# Patient Record
Sex: Female | Born: 1940 | Hispanic: No | Smoking: Never smoker
Health system: Southern US, Community
[De-identification: ages and names within clinical notes are randomized; demographics above are authoritative.]

## PROBLEM LIST (undated history)

## (undated) DIAGNOSIS — T380X5A Adverse effect of glucocorticoids and synthetic analogues, initial encounter: Secondary | ICD-10-CM

## (undated) DIAGNOSIS — Z9289 Personal history of other medical treatment: Secondary | ICD-10-CM

## (undated) DIAGNOSIS — R718 Other abnormality of red blood cells: Secondary | ICD-10-CM

## (undated) DIAGNOSIS — M8580 Other specified disorders of bone density and structure, unspecified site: Secondary | ICD-10-CM

## (undated) DIAGNOSIS — Z800 Family history of malignant neoplasm of digestive organs: Secondary | ICD-10-CM

## (undated) DIAGNOSIS — E785 Hyperlipidemia, unspecified: Secondary | ICD-10-CM

## (undated) DIAGNOSIS — D7589 Other specified diseases of blood and blood-forming organs: Secondary | ICD-10-CM

## (undated) DIAGNOSIS — C539 Malignant neoplasm of cervix uteri, unspecified: Secondary | ICD-10-CM

## (undated) DIAGNOSIS — E039 Hypothyroidism, unspecified: Secondary | ICD-10-CM

## (undated) DIAGNOSIS — K589 Irritable bowel syndrome without diarrhea: Secondary | ICD-10-CM

## (undated) DIAGNOSIS — N816 Rectocele: Secondary | ICD-10-CM

## (undated) DIAGNOSIS — L719 Rosacea, unspecified: Secondary | ICD-10-CM

## (undated) DIAGNOSIS — IMO0001 Reserved for inherently not codable concepts without codable children: Secondary | ICD-10-CM

## (undated) DIAGNOSIS — M1990 Unspecified osteoarthritis, unspecified site: Secondary | ICD-10-CM

## (undated) DIAGNOSIS — K2970 Gastritis, unspecified, without bleeding: Secondary | ICD-10-CM

## (undated) DIAGNOSIS — Z23 Encounter for immunization: Secondary | ICD-10-CM

## (undated) DIAGNOSIS — H579 Unspecified disorder of eye and adnexa: Secondary | ICD-10-CM

## (undated) DIAGNOSIS — D649 Anemia, unspecified: Secondary | ICD-10-CM

## (undated) HISTORY — DX: Malignant neoplasm of cervix uteri, unspecified: C53.9

## (undated) HISTORY — DX: Encounter for immunization: Z23

## (undated) HISTORY — PX: BREAST EXCISIONAL BIOPSY: SUR124

## (undated) HISTORY — DX: Rectocele: N81.6

## (undated) HISTORY — DX: Adverse effect of glucocorticoids and synthetic analogues, initial encounter: T38.0X5A

## (undated) HISTORY — DX: Family history of malignant neoplasm of digestive organs: Z80.0

## (undated) HISTORY — PX: ABDOMINAL HYSTERECTOMY: SHX81

## (undated) HISTORY — DX: Personal history of other medical treatment: Z92.89

## (undated) HISTORY — DX: Other specified disorders of bone density and structure, unspecified site: M85.80

## (undated) HISTORY — DX: Hypothyroidism, unspecified: E03.9

## (undated) HISTORY — DX: Hyperlipidemia, unspecified: E78.5

## (undated) HISTORY — DX: Unspecified disorder of eye and adnexa: H57.9

## (undated) HISTORY — DX: Anemia, unspecified: D64.9

## (undated) HISTORY — PX: APPENDECTOMY: SHX54

## (undated) HISTORY — DX: Unspecified osteoarthritis, unspecified site: M19.90

## (undated) HISTORY — DX: Irritable bowel syndrome, unspecified: K58.9

## (undated) HISTORY — DX: Other abnormality of red blood cells: R71.8

## (undated) HISTORY — DX: Gastritis, unspecified, without bleeding: K29.70

## (undated) HISTORY — DX: Rosacea, unspecified: L71.9

## (undated) HISTORY — DX: Other specified diseases of blood and blood-forming organs: D75.89

## (undated) HISTORY — DX: Reserved for inherently not codable concepts without codable children: IMO0001

## (undated) HISTORY — PX: CATARACT EXTRACTION, BILATERAL: SHX1313

---

## 1966-09-22 DIAGNOSIS — C539 Malignant neoplasm of cervix uteri, unspecified: Secondary | ICD-10-CM

## 1966-09-22 HISTORY — DX: Malignant neoplasm of cervix uteri, unspecified: C53.9

## 1997-09-22 DIAGNOSIS — Z23 Encounter for immunization: Secondary | ICD-10-CM

## 1997-09-22 HISTORY — DX: Encounter for immunization: Z23

## 2002-06-15 ENCOUNTER — Other Ambulatory Visit: Admission: RE | Admit: 2002-06-15 | Discharge: 2002-06-15 | Payer: Self-pay | Admitting: Obstetrics and Gynecology

## 2003-04-24 ENCOUNTER — Encounter: Payer: Self-pay | Admitting: Internal Medicine

## 2003-09-23 DIAGNOSIS — IMO0001 Reserved for inherently not codable concepts without codable children: Secondary | ICD-10-CM

## 2003-09-23 HISTORY — DX: Reserved for inherently not codable concepts without codable children: IMO0001

## 2004-03-04 ENCOUNTER — Encounter: Payer: Self-pay | Admitting: Internal Medicine

## 2005-01-21 ENCOUNTER — Ambulatory Visit: Payer: Self-pay | Admitting: Internal Medicine

## 2005-01-27 ENCOUNTER — Ambulatory Visit: Payer: Self-pay | Admitting: Internal Medicine

## 2005-03-03 ENCOUNTER — Ambulatory Visit: Payer: Self-pay | Admitting: Internal Medicine

## 2005-03-04 ENCOUNTER — Ambulatory Visit: Payer: Self-pay | Admitting: Internal Medicine

## 2005-05-08 ENCOUNTER — Ambulatory Visit: Payer: Self-pay | Admitting: Internal Medicine

## 2005-05-19 ENCOUNTER — Ambulatory Visit: Payer: Self-pay | Admitting: Internal Medicine

## 2005-08-21 ENCOUNTER — Encounter: Payer: Self-pay | Admitting: Internal Medicine

## 2005-08-21 ENCOUNTER — Other Ambulatory Visit: Admission: RE | Admit: 2005-08-21 | Discharge: 2005-08-21 | Payer: Self-pay | Admitting: Obstetrics and Gynecology

## 2006-03-20 ENCOUNTER — Ambulatory Visit: Payer: Self-pay | Admitting: Internal Medicine

## 2006-04-03 ENCOUNTER — Encounter: Admission: RE | Admit: 2006-04-03 | Discharge: 2006-04-03 | Payer: Self-pay | Admitting: Internal Medicine

## 2006-06-23 ENCOUNTER — Ambulatory Visit: Payer: Self-pay | Admitting: Internal Medicine

## 2006-07-03 ENCOUNTER — Ambulatory Visit: Payer: Self-pay | Admitting: Internal Medicine

## 2006-09-14 ENCOUNTER — Ambulatory Visit: Payer: Self-pay | Admitting: Family Medicine

## 2006-09-22 DIAGNOSIS — T380X5A Adverse effect of glucocorticoids and synthetic analogues, initial encounter: Secondary | ICD-10-CM

## 2006-09-22 HISTORY — DX: Adverse effect of glucocorticoids and synthetic analogues, initial encounter: T38.0X5A

## 2006-09-24 ENCOUNTER — Ambulatory Visit: Payer: Self-pay | Admitting: Internal Medicine

## 2006-12-15 ENCOUNTER — Ambulatory Visit: Payer: Self-pay | Admitting: Family Medicine

## 2006-12-31 ENCOUNTER — Encounter: Payer: Self-pay | Admitting: Internal Medicine

## 2007-07-07 ENCOUNTER — Ambulatory Visit: Payer: Self-pay | Admitting: Internal Medicine

## 2007-08-24 ENCOUNTER — Ambulatory Visit: Payer: Self-pay | Admitting: Internal Medicine

## 2007-08-24 LAB — CONVERTED CEMR LAB
Bilirubin Urine: NEGATIVE
Blood in Urine, dipstick: NEGATIVE
Glucose, Urine, Semiquant: NEGATIVE
Ketones, urine, test strip: NEGATIVE
Nitrite: NEGATIVE
Protein, U semiquant: NEGATIVE
Specific Gravity, Urine: 1.015
Urobilinogen, UA: 0.2
WBC Urine, dipstick: NEGATIVE
pH: 7

## 2007-08-26 LAB — CONVERTED CEMR LAB
ALT: 17 units/L (ref 0–35)
AST: 20 units/L (ref 0–37)
Albumin: 3.9 g/dL (ref 3.5–5.2)
Alkaline Phosphatase: 67 units/L (ref 39–117)
BUN: 10 mg/dL (ref 6–23)
Basophils Absolute: 0 10*3/uL (ref 0.0–0.1)
Basophils Relative: 0.6 % (ref 0.0–1.0)
Bilirubin, Direct: 0.1 mg/dL (ref 0.0–0.3)
CO2: 31 meq/L (ref 19–32)
Calcium: 9.8 mg/dL (ref 8.4–10.5)
Chloride: 101 meq/L (ref 96–112)
Cholesterol: 229 mg/dL (ref 0–200)
Creatinine, Ser: 0.7 mg/dL (ref 0.4–1.2)
Direct LDL: 147.4 mg/dL
Eosinophils Absolute: 0.1 10*3/uL (ref 0.0–0.6)
Eosinophils Relative: 1.3 % (ref 0.0–5.0)
GFR calc Af Amer: 107 mL/min
GFR calc non Af Amer: 89 mL/min
Glucose, Bld: 90 mg/dL (ref 70–99)
HCT: 39.1 % (ref 36.0–46.0)
HDL: 48.6 mg/dL (ref >39.0)
Hemoglobin: 13.3 g/dL (ref 12.0–15.0)
Lymphocytes Relative: 39.6 % (ref 12.0–46.0)
MCHC: 34 g/dL (ref 30.0–36.0)
MCV: 107.2 fL — ABNORMAL HIGH (ref 78.0–100.0)
Monocytes Absolute: 0.3 10*3/uL (ref 0.2–0.7)
Monocytes Relative: 6.8 % (ref 3.0–11.0)
Neutro Abs: 2.1 10*3/uL (ref 1.4–7.7)
Neutrophils Relative %: 51.7 % (ref 43.0–77.0)
Platelets: 217 10*3/uL (ref 150–400)
Potassium: 4.2 meq/L (ref 3.5–5.1)
RBC: 3.64 M/uL — ABNORMAL LOW (ref 3.87–5.11)
RDW: 11.4 % — ABNORMAL LOW (ref 11.5–14.6)
Sodium: 139 meq/L (ref 135–145)
TSH: 0.96 microintl units/mL (ref 0.35–5.50)
Total Bilirubin: 0.8 mg/dL (ref 0.3–1.2)
Total CHOL/HDL Ratio: 4.7
Total Protein: 6.4 g/dL (ref 6.0–8.3)
Triglycerides: 120 mg/dL (ref 0–149)
VLDL: 24 mg/dL (ref 0–40)
WBC: 4.2 10*3/uL — ABNORMAL LOW (ref 4.5–10.5)

## 2007-08-31 ENCOUNTER — Ambulatory Visit: Payer: Self-pay | Admitting: Internal Medicine

## 2007-08-31 DIAGNOSIS — E039 Hypothyroidism, unspecified: Secondary | ICD-10-CM | POA: Insufficient documentation

## 2007-08-31 DIAGNOSIS — J069 Acute upper respiratory infection, unspecified: Secondary | ICD-10-CM | POA: Insufficient documentation

## 2007-08-31 DIAGNOSIS — J019 Acute sinusitis, unspecified: Secondary | ICD-10-CM | POA: Insufficient documentation

## 2007-08-31 DIAGNOSIS — L719 Rosacea, unspecified: Secondary | ICD-10-CM | POA: Insufficient documentation

## 2007-08-31 DIAGNOSIS — D7589 Other specified diseases of blood and blood-forming organs: Secondary | ICD-10-CM | POA: Insufficient documentation

## 2007-08-31 DIAGNOSIS — M1611 Unilateral primary osteoarthritis, right hip: Secondary | ICD-10-CM | POA: Insufficient documentation

## 2007-08-31 DIAGNOSIS — E782 Mixed hyperlipidemia: Secondary | ICD-10-CM | POA: Insufficient documentation

## 2007-08-31 DIAGNOSIS — M949 Disorder of cartilage, unspecified: Secondary | ICD-10-CM

## 2007-08-31 DIAGNOSIS — M899 Disorder of bone, unspecified: Secondary | ICD-10-CM | POA: Insufficient documentation

## 2008-01-24 ENCOUNTER — Ambulatory Visit: Payer: Self-pay | Admitting: Internal Medicine

## 2008-01-24 LAB — CONVERTED CEMR LAB
Basophils Absolute: 0 10*3/uL (ref 0.0–0.1)
Basophils Relative: 0.6 % (ref 0.0–1.0)
Cholesterol: 204 mg/dL (ref 0–200)
Direct LDL: 141.5 mg/dL
Eosinophils Absolute: 0.1 10*3/uL (ref 0.0–0.7)
Eosinophils Relative: 1.8 % (ref 0.0–5.0)
HCT: 38.9 % (ref 36.0–46.0)
HDL: 39.7 mg/dL (ref >39.0)
Hemoglobin: 13.3 g/dL (ref 12.0–15.0)
Lymphocytes Relative: 30.3 % (ref 12.0–46.0)
MCHC: 34.1 g/dL (ref 30.0–36.0)
MCV: 106.4 fL — ABNORMAL HIGH (ref 78.0–100.0)
Monocytes Absolute: 0.3 10*3/uL (ref 0.1–1.0)
Monocytes Relative: 6.6 % (ref 3.0–12.0)
Neutro Abs: 3.2 10*3/uL (ref 1.4–7.7)
Neutrophils Relative %: 60.7 % (ref 43.0–77.0)
Platelets: 289 10*3/uL (ref 150–400)
RBC: 3.66 M/uL — ABNORMAL LOW (ref 3.87–5.11)
RDW: 11.1 % — ABNORMAL LOW (ref 11.5–14.6)
Total CHOL/HDL Ratio: 5.1
Triglycerides: 110 mg/dL (ref 0–149)
VLDL: 22 mg/dL (ref 0–40)
Vit D, 1,25-Dihydroxy: 37 (ref 30–89)
WBC: 5.2 10*3/uL (ref 4.5–10.5)

## 2008-01-31 ENCOUNTER — Ambulatory Visit: Payer: Self-pay | Admitting: Internal Medicine

## 2008-01-31 DIAGNOSIS — J3489 Other specified disorders of nose and nasal sinuses: Secondary | ICD-10-CM | POA: Insufficient documentation

## 2008-01-31 LAB — CONVERTED CEMR LAB
Cholesterol, target level: 200 mg/dL
HDL goal, serum: 40 mg/dL
LDL Goal: 130 mg/dL

## 2008-02-02 ENCOUNTER — Telehealth: Payer: Self-pay | Admitting: Internal Medicine

## 2008-02-21 DIAGNOSIS — H579 Unspecified disorder of eye and adnexa: Secondary | ICD-10-CM

## 2008-02-21 HISTORY — DX: Unspecified disorder of eye and adnexa: H57.9

## 2008-07-05 ENCOUNTER — Ambulatory Visit: Payer: Self-pay | Admitting: Internal Medicine

## 2008-09-22 DIAGNOSIS — Z9289 Personal history of other medical treatment: Secondary | ICD-10-CM

## 2008-09-22 HISTORY — DX: Personal history of other medical treatment: Z92.89

## 2008-09-22 LAB — HM MAMMOGRAPHY: HM Mammogram: NORMAL

## 2008-10-24 ENCOUNTER — Ambulatory Visit: Payer: Self-pay | Admitting: Internal Medicine

## 2009-01-09 ENCOUNTER — Ambulatory Visit: Payer: Self-pay | Admitting: Internal Medicine

## 2009-01-19 LAB — CONVERTED CEMR LAB
Basophils Absolute: 0 10*3/uL (ref 0.0–0.1)
Basophils Relative: 0.1 % (ref 0.0–3.0)
Eosinophils Absolute: 0 10*3/uL (ref 0.0–0.7)
Eosinophils Relative: 1.2 % (ref 0.0–5.0)
HCT: 37.6 % (ref 36.0–46.0)
Hemoglobin: 13.1 g/dL (ref 12.0–15.0)
Lymphocytes Relative: 44.7 % (ref 12.0–46.0)
Lymphs Abs: 1.6 10*3/uL (ref 0.7–4.0)
MCHC: 35 g/dL (ref 30.0–36.0)
MCV: 105.5 fL — ABNORMAL HIGH (ref 78.0–100.0)
Monocytes Absolute: 0.3 10*3/uL (ref 0.1–1.0)
Monocytes Relative: 7.9 % (ref 3.0–12.0)
Neutro Abs: 1.6 10*3/uL (ref 1.4–7.7)
Neutrophils Relative %: 46.1 % (ref 43.0–77.0)
Platelets: 199 10*3/uL (ref 150.0–400.0)
RBC: 3.56 M/uL — ABNORMAL LOW (ref 3.87–5.11)
RDW: 11.4 % — ABNORMAL LOW (ref 11.5–14.6)
TSH: 1.09 microintl units/mL (ref 0.35–5.50)
WBC: 3.5 10*3/uL — ABNORMAL LOW (ref 4.5–10.5)

## 2009-03-09 ENCOUNTER — Ambulatory Visit: Payer: Self-pay | Admitting: Family Medicine

## 2009-03-09 DIAGNOSIS — H109 Unspecified conjunctivitis: Secondary | ICD-10-CM | POA: Insufficient documentation

## 2009-03-13 ENCOUNTER — Telehealth: Payer: Self-pay | Admitting: *Deleted

## 2009-07-23 ENCOUNTER — Ambulatory Visit: Payer: Self-pay | Admitting: Internal Medicine

## 2009-10-31 ENCOUNTER — Encounter: Payer: Self-pay | Admitting: Internal Medicine

## 2009-11-13 ENCOUNTER — Ambulatory Visit: Payer: Self-pay | Admitting: Internal Medicine

## 2009-11-13 DIAGNOSIS — I8390 Asymptomatic varicose veins of unspecified lower extremity: Secondary | ICD-10-CM | POA: Insufficient documentation

## 2009-11-15 LAB — CONVERTED CEMR LAB
ALT: 19 units/L (ref 0–35)
AST: 21 units/L (ref 0–37)
Albumin: 4.1 g/dL (ref 3.5–5.2)
Alkaline Phosphatase: 66 units/L (ref 39–117)
BUN: 10 mg/dL (ref 6–23)
Basophils Absolute: 0 10*3/uL (ref 0.0–0.1)
Basophils Relative: 0.3 % (ref 0.0–3.0)
Bilirubin, Direct: 0.1 mg/dL (ref 0.0–0.3)
CO2: 32 meq/L (ref 19–32)
Calcium: 9.6 mg/dL (ref 8.4–10.5)
Chloride: 106 meq/L (ref 96–112)
Cholesterol: 212 mg/dL — ABNORMAL HIGH (ref 0–200)
Creatinine, Ser: 0.7 mg/dL (ref 0.4–1.2)
Direct LDL: 144.8 mg/dL
Eosinophils Absolute: 0 10*3/uL (ref 0.0–0.7)
Eosinophils Relative: 1.1 % (ref 0.0–5.0)
GFR calc non Af Amer: 88.11 mL/min (ref >60)
Glucose, Bld: 95 mg/dL (ref 70–99)
HCT: 38 % (ref 36.0–46.0)
HDL: 58.8 mg/dL (ref >39.00)
Hemoglobin: 12.8 g/dL (ref 12.0–15.0)
Lymphocytes Relative: 44.7 % (ref 12.0–46.0)
Lymphs Abs: 1.6 10*3/uL (ref 0.7–4.0)
MCHC: 33.7 g/dL (ref 30.0–36.0)
MCV: 108 fL — ABNORMAL HIGH (ref 78.0–100.0)
Monocytes Absolute: 0.3 10*3/uL (ref 0.1–1.0)
Monocytes Relative: 8.1 % (ref 3.0–12.0)
Neutro Abs: 1.6 10*3/uL (ref 1.4–7.7)
Neutrophils Relative %: 45.8 % (ref 43.0–77.0)
Platelets: 215 10*3/uL (ref 150.0–400.0)
Potassium: 4.1 meq/L (ref 3.5–5.1)
RBC: 3.52 M/uL — ABNORMAL LOW (ref 3.87–5.11)
RDW: 10.8 % — ABNORMAL LOW (ref 11.5–14.6)
Sodium: 142 meq/L (ref 135–145)
TSH: 1.92 microintl units/mL (ref 0.35–5.50)
Total Bilirubin: 0.6 mg/dL (ref 0.3–1.2)
Total CHOL/HDL Ratio: 4
Total Protein: 6.9 g/dL (ref 6.0–8.3)
Triglycerides: 115 mg/dL (ref 0.0–149.0)
VLDL: 23 mg/dL (ref 0.0–40.0)
WBC: 3.5 10*3/uL — ABNORMAL LOW (ref 4.5–10.5)

## 2010-05-21 ENCOUNTER — Ambulatory Visit: Payer: Self-pay | Admitting: Internal Medicine

## 2010-05-21 DIAGNOSIS — R42 Dizziness and giddiness: Secondary | ICD-10-CM | POA: Insufficient documentation

## 2010-05-21 DIAGNOSIS — J310 Chronic rhinitis: Secondary | ICD-10-CM | POA: Insufficient documentation

## 2010-06-04 ENCOUNTER — Encounter (INDEPENDENT_AMBULATORY_CARE_PROVIDER_SITE_OTHER): Payer: Self-pay | Admitting: *Deleted

## 2010-07-04 DIAGNOSIS — K2970 Gastritis, unspecified, without bleeding: Secondary | ICD-10-CM | POA: Insufficient documentation

## 2010-07-04 DIAGNOSIS — K2990 Gastroduodenitis, unspecified, without bleeding: Secondary | ICD-10-CM

## 2010-07-10 ENCOUNTER — Ambulatory Visit: Payer: Self-pay | Admitting: Internal Medicine

## 2010-07-10 DIAGNOSIS — K589 Irritable bowel syndrome without diarrhea: Secondary | ICD-10-CM | POA: Insufficient documentation

## 2010-07-11 ENCOUNTER — Ambulatory Visit (HOSPITAL_COMMUNITY)
Admission: RE | Admit: 2010-07-11 | Discharge: 2010-07-11 | Payer: Self-pay | Source: Home / Self Care | Admitting: Internal Medicine

## 2010-07-18 ENCOUNTER — Ambulatory Visit: Payer: Self-pay | Admitting: Internal Medicine

## 2010-07-18 LAB — HM COLONOSCOPY

## 2010-10-20 LAB — CONVERTED CEMR LAB
ALT: 17 units/L (ref 0–35)
AST: 22 units/L (ref 0–37)
Albumin: 4 g/dL (ref 3.5–5.2)
Alkaline Phosphatase: 60 units/L (ref 39–117)
BUN: 11 mg/dL (ref 6–23)
Basophils Absolute: 0 10*3/uL (ref 0.0–0.1)
Basophils Relative: 0.6 % (ref 0.0–3.0)
Bilirubin Urine: NEGATIVE
Bilirubin, Direct: 0.1 mg/dL (ref 0.0–0.3)
Blood in Urine, dipstick: NEGATIVE
CO2: 32 meq/L (ref 19–32)
CRP, High Sensitivity: <1 — ABNORMAL LOW (ref 0.00–5.00)
Calcium: 9.8 mg/dL (ref 8.4–10.5)
Chloride: 108 meq/L (ref 96–112)
Cholesterol: 208 mg/dL (ref 0–200)
Creatinine, Ser: 0.7 mg/dL (ref 0.4–1.2)
Direct LDL: 134.7 mg/dL
Eosinophils Absolute: 0.1 10*3/uL (ref 0.0–0.7)
Eosinophils Relative: 2.1 % (ref 0.0–5.0)
GFR calc Af Amer: 107 mL/min
GFR calc non Af Amer: 88 mL/min
Glucose, Bld: 103 mg/dL — ABNORMAL HIGH (ref 70–99)
Glucose, Urine, Semiquant: NEGATIVE
HCT: 37.6 % (ref 36.0–46.0)
HDL: 46.5 mg/dL (ref >39.0)
Hemoglobin: 13.1 g/dL (ref 12.0–15.0)
Ketones, urine, test strip: NEGATIVE
Lymphocytes Relative: 42 % (ref 12.0–46.0)
MCHC: 34.9 g/dL (ref 30.0–36.0)
MCV: 104.4 fL — ABNORMAL HIGH (ref 78.0–100.0)
Monocytes Absolute: 0.3 10*3/uL (ref 0.1–1.0)
Monocytes Relative: 7.2 % (ref 3.0–12.0)
Neutro Abs: 1.7 10*3/uL (ref 1.4–7.7)
Neutrophils Relative %: 48.1 % (ref 43.0–77.0)
Nitrite: NEGATIVE
Platelets: 197 10*3/uL (ref 150–400)
Potassium: 4.8 meq/L (ref 3.5–5.1)
Protein, U semiquant: NEGATIVE
RBC: 3.6 M/uL — ABNORMAL LOW (ref 3.87–5.11)
RDW: 11.2 % — ABNORMAL LOW (ref 11.5–14.6)
Sodium: 142 meq/L (ref 135–145)
Specific Gravity, Urine: 1.015
TSH: 0.3 microintl units/mL — ABNORMAL LOW (ref 0.35–5.50)
Total Bilirubin: 0.7 mg/dL (ref 0.3–1.2)
Total CHOL/HDL Ratio: 4.5
Total Protein: 6.9 g/dL (ref 6.0–8.3)
Triglycerides: 119 mg/dL (ref 0–149)
Urobilinogen, UA: 0.2
VLDL: 24 mg/dL (ref 0–40)
WBC Urine, dipstick: NEGATIVE
WBC: 3.6 10*3/uL — ABNORMAL LOW (ref 4.5–10.5)
pH: 7.5

## 2010-10-22 NOTE — Procedures (Signed)
Summary: Colonoscopy  Patient: Angela Burgess Note: All result statuses are Final unless otherwise noted.  Tests: (1) Colonoscopy (COL)   COL Colonoscopy           DONE     [facility]     [address]     [place], [place]  [address]           COLONOSCOPY PROCEDURE REPORT           PATIENT:  [name], [name]  MR#:  [id number]     BIRTHDATE:  October 16, 1940, 68 yrs. old  GENDER:  female     ENDOSCOPIST:  Hedwig Morton. Juanda Chance, MD     REF. BY:  Harold Hedge, M.D.     PROCEDURE DATE:  07/18/2010     PROCEDURE:  Colonoscopy 63875     ASA CLASS:  Class I     INDICATIONS:  change in bowel habits pelvic relaxation, scheduled     for ant and post colporrhaphy,     last colon 2007     MEDICATIONS:   Versed 8 mg, Fentanyl 75 mcg           DESCRIPTION OF PROCEDURE:   After the risks benefits and     alternatives of the procedure were thoroughly explained, informed     consent was obtained.  Digital rectal exam was performed and     revealed no rectal masses.   The LB [id number] endoscope     was introduced through the anus and advanced to the cecum, which     was identified by both the appendix and ileocecal valve, without     limitations.  The quality of the prep was excellent, using     MiraLax.  The instrument was then slowly withdrawn as the colon     was fully examined.     <<PROCEDUREIMAGES>>           FINDINGS:  Mild diverticulosis was found in the sigmoid colon (see     image4). mildly tortuous but not obstructed colon  This was     otherwise a normal examination of the colon (see image6, image5,     image3, image1, and image2).   Retroflexed views in the rectum     revealed no abnormalities.    The scope was then withdrawn from     the patient and the procedure completed.           COMPLICATIONS:  None     ENDOSCOPIC IMPRESSION:     1) Mild diverticulosis in the sigmoid colon     2) Otherwise normal examination     RECOMMENDATIONS:     1) high fiber diet  continue Probiotic ( Align)     abdominal ultrasound was normal     REPEAT EXAM:  In 10 year(s) for.           ______________________________     Hedwig Morton. Juanda Chance, MD           CC:           n.     eSIGNED:   Hedwig Morton. Brodie at 07/18/2010 02:13 PM           Lilian Coma, [id number]  Note: An exclamation mark (!) indicates a result that was not dispersed into the flowsheet. Document Creation Date: 07/18/2010 2:14 PM _______________________________________________________________________  (1) Order result status: Final Collection or observation date-time: 07/18/2010 13:54 Requested date-time:  Receipt date-time:  Reported date-time:  Referring Physician:  Ordering Physician: Lina Sar [phone]) Specimen Source:  Source: Launa Grill Order Number: [id number] Lab site:   Appended Document: Colonoscopy    Clinical Lists Changes  Observations: Added new observation of COLONNXTDUE: 06/2020 (07/18/2010 15:26)

## 2010-10-22 NOTE — Procedures (Signed)
Summary: EGD   EGD  Procedure date:  04/24/2003  Findings:      Location: [facility]   Patient Name: Angela Burgess, Angela Burgess MRN:  Procedure Procedures: Panendoscopy (EGD) CPT: 43235.    with biopsy(s)/brushing(s). CPT: D1846139.  Personnel: Endoscopist: Dora L. Juanda Chance, MD.  Exam Location: Exam performed in Outpatient Clinic. Outpatient  Patient Consent: Procedure, Alternatives, Risks and Benefits discussed, consent obtained, from patient. Consent was obtained by the RN.  Indications Symptoms: Abdominal pain, location: epigastric.  History  Pre-Exam Physical: Performed Apr 24, 2003  Cardio-pulmonary exam, HEENT exam, Abdominal exam, Extremity exam, Neurological exam, Mental status exam WNL.  Exam Exam Info: Maximum depth of insertion Jejunum, intended Jejunum. Vocal cords visualized. Gastric retroflexion performed. Images taken. ASA Classification: I. Tolerance: excellent.  Sedation Meds: Patient assessed and found to be appropriate for moderate (conscious) sedation. Fentanyl 50 mcg. given IV. Versed 5 mg. given IV. Cetacaine Spray 2 sprays given aerosolized.  Monitoring: BP and pulse monitoring done. Oximetry used. Supplemental O2 given  Findings - MUCOSAL ABNORMALITY: Body to Antrum. Erythematous mucosa. Mosaic/scaly mucosa. RUT done, results pending. ICD9: Gastritis, Other: 535.40. Comment: nonspecific mild gastritis, antrum.   Assessment Abnormal examination, see findings above.  Diagnoses: 535.40: Gastritis, Other.   Comments: s/p CLO test Events  Unplanned Intervention: No unplanned interventions were required.  Unplanned Events: There were no complications. Plans Medication(s): Await pathology. H2Blocker: Pepcid/Famotidine 20 mg prn, starting Apr 24, 2003   Disposition: After procedure patient sent to recovery. After recovery patient sent home.   This report was created from the original endoscopy report, which was reviewed and signed by the  above listed endoscopist.

## 2010-10-22 NOTE — Letter (Signed)
Summary: [facility]  [facility]   Imported By: Maryln Gottron 11/14/2009 14:09:47  _____________________________________________________________________  External Attachment:    Type:   Image     Comment:   External Document

## 2010-10-22 NOTE — Procedures (Signed)
Summary: COLON   Colonoscopy  Procedure date:  04/24/2003  Findings:      Location:  [facility].    Procedures Next Due Date:    Colonoscopy: 04/2013 Patient Name: Angela Burgess, Angela Burgess MRN:  Procedure Procedures: Colonoscopy CPT: 16109.  Personnel: Endoscopist: Dora L. Juanda Chance, MD.  Exam Location: Exam performed in Outpatient Clinic. Outpatient  Patient Consent: Procedure, Alternatives, Risks and Benefits discussed, consent obtained, from patient. Consent was obtained by the RN.  Indications  Average Risk Screening Routine.  History  Pre-Exam Physical: Performed Apr 24, 2003. Cardio-pulmonary exam, Rectal exam, HEENT exam , Abdominal exam, Extremity exam, Neurological exam, Mental status exam WNL.  Exam Exam: Extent of exam reached: Cecum, extent intended: Cecum.  The cecum was identified by appendiceal orifice and IC valve. Patient position: from side to side. Colon retroflexion performed. Images taken. ASA Classification: I. Tolerance: good.  Monitoring: Pulse and BP monitoring, Oximetry used. Supplemental O2 given.  Colon Prep Used Visicol for colon prep. Prep results: good.  Sedation Meds: Patient assessed and found to be appropriate for moderate (conscious) sedation. Fentanyl 50 mcg. given IV. Versed 5 mg. given IV.  Findings NORMAL EXAM: Cecum.  NORMAL EXAM: Rectum.   Assessment Normal examination.  Comments: no polyps Events  Unplanned Interventions: No intervention was required.  Unplanned Events: There were no complications. Plans Patient Education: Patient given standard instructions for: Yearly hemoccult testing recommended. Patient instructed to get routine colonoscopy every 10 years.  Disposition: After procedure patient sent to recovery. After recovery patient sent home.   This report was created from the original endoscopy report, which was reviewed and signed by the above listed endoscopist.

## 2010-10-22 NOTE — Letter (Signed)
Summary: New Patient letter  [facility]  [address] [place], [place] [address]   Phone: [phone]  Fax: [phone]       06/04/2010 MRN: [id number]  [facility] [address] [place], [place]  [address]  Dear Ms. Esquibel,  Welcome to the Gastroenterology Division at [facility].    You are scheduled to see Dr.  Juanda Chance on 08-12-10 at 10:30a.m. on the 3rd floor at [facility], [address]  We ask that you try to arrive at our office 15 minutes prior to your appointment time to allow for check-in.  We would like you to complete the enclosed self-administered evaluation form prior to your visit and bring it with you on the day of your appointment.  We will review it with you.  Also, please bring a complete list of all your medications or, if you prefer, bring the medication bottles and we will list them.  Please bring your insurance card so that we may make a copy of it.  If your insurance requires a referral to see a specialist, please bring your referral form from your primary care physician.  Co-payments are due at the time of your visit and may be paid by cash, check or credit card.     Your office visit will consist of a consult with your physician (includes a physical exam), any laboratory testing he/she may order, scheduling of any necessary diagnostic testing (e.g. x-ray, ultrasound, CT-scan), and scheduling of a procedure (e.g. Endoscopy, Colonoscopy) if required.  Please allow enough time on your schedule to allow for any/all of these possibilities.    If you cannot keep your appointment, please call [phone] to cancel or reschedule prior to your appointment date.  This allows [place] the opportunity to schedule an appointment for another patient in need of care.  If you do not cancel or reschedule by 5 p.m. the business day prior to your appointment date, you will be charged a $50.00 late cancellation/no-show fee.    Thank you for choosing  [facility] for your medical needs.  We appreciate the opportunity to care for you.  Please visit [place] at our website  to learn more about our practice.                     Sincerely,                                                             The Gastroenterology Division

## 2010-10-22 NOTE — Assessment & Plan Note (Signed)
Summary: IBS/DISCUSS SURGERY/[facility]   History of Present Illness Visit Type: Initial Visit Primary GI MD: Lina Sar MD Primary Provider: Berniece Andreas, MD Chief Complaint: Discuss rectal, vaginal, anterior prolapse surgery History of Present Illness:   This is a 70 year old white female with a history of irritable bowel syndrome. She had a normal colonoscopy in 2004, and mild gastritis on an upper endoscopy in August 2004. She has been having signs of pelvic relaxation, specifically in the ability to evacuate the stool and also leaking urine. 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 She denies being constipated. Her stools are very soft and there has been no blood. She has a hard time wiping and cleaning. She also has food intolerance and some dyspepsia. There is a strong family history of gallbladder disease in most of her siblings as well as in her mother and aunts. Her weight has remained stable. She was offered an alternative  treatment of her rectocele ,a vaginal pessary. She is somewhat concerned about the use of the mesh for the colporrhaphy.   GI Review of Systems    Reports abdominal pain, acid reflux, bloating, and  loss of appetite.     Location of  Abdominal pain: lower abdomen.    Denies belching, chest pain, dysphagia with liquids, dysphagia with solids, heartburn, nausea, vomiting, vomiting blood, weight loss, and  weight gain.      Reports change in bowel habits, fecal incontinence, jaundice, liver problems, and  rectal pain.     Denies anal fissure, black tarry stools, constipation, diarrhea, diverticulosis, heme positive stool, hemorrhoids, irritable bowel syndrome, light color stool, and  rectal bleeding.    Current Medications (verified): 1)  Levothyroxine Sodium 112 Mcg Tabs (Levothyroxine Sodium) .[name].. 1 By Mouth Once Daily 2)  Folic Acid 1 Mg  Tabs  (Folic Acid) 3)  Multi-Vitamin   Tabs (Multiple Vitamin) 4)  Calcium 500/d 500-125 Mg-Unit  Tabs (Calcium Carbonate-Vitamin D) 5)  Fish Oil   Oil (Fish Oil) 6)  Flonase 50 Mcg/act  Susp (Fluticasone Propionate) 7)  Lutein 20 Mg Caps (Lutein) 8)  Magnesium Aspartate 65 Mg Tabs (Magnesium)  Allergies (verified): 1)  ! 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 Marjie Skiff Dr. Mia Creek Dr. Gabriel Cirri Dr. Stasia Cavalier Anemia Arthritis  Past Surgical History: Hysterectomy age 54 ? cancer ? Appendectomy  Family History: Reviewed history from 10/24/2008 and no changes required. see old chart  fa leukemia   dm cad mom cirrhosis, stones son alcohol Father: Leukemia, heart attacks first at age 20 , diabetes Mother: Cirrhosis, Kidney Stones, protein allergy that sent poison to her brain, Siblings: stroke, bipolar (sister), thyroid problems, anemias, lung cancer, seizures Sibs with heart murmurs.    Social History: Reviewed history from 11/13/2009 and no changes required. Retired Married Never Smoked Alcohol   1-2 per months  HH of   2  no pets   Caffeine 3 per day .       Review of Systems       The patient complains of allergy/sinus, change in vision, and urine leakage.  The patient denies anemia,  anxiety-new, arthritis/joint pain, back pain, blood in urine, breast changes/lumps, confusion, cough, coughing up blood, depression-new, fainting, fatigue, fever, headaches-new, hearing problems, heart murmur, heart rhythm changes, itching, menstrual pain, muscle pains/cramps, night sweats, nosebleeds, pregnancy symptoms, shortness of breath, skin rash, sleeping problems, sore throat, swelling of feet/legs, swollen  lymph glands, thirst - excessive , urination - excessive , urination changes/pain, vision changes, and voice change.         Pertinent positive and negative review of systems were noted in the above HPI. All other ROS was otherwise negative.   Vital Signs:  Patient profile:   70 year old female Menstrual status:  hysterectomy Height:      64.75 inches Weight:      126.25 pounds BMI:     21.25 Pulse rate:   68 / minute Pulse rhythm:   regular BP sitting:   120 / 66  (right arm) Cuff size:   regular  Vitals Entered By: June McMurray CMA Duncan Dull) (July 10, 2010 8:20 AM)  Physical Exam  General:  Well developed, well nourished, no acute distress. Mouth:  No deformity or lesions, dentition normal. Neck:  Supple; no masses or thyromegaly. Lungs:  Clear throughout to auscultation. Heart:  Regular rate and rhythm; no murmurs, rubs,  or bruits. Abdomen:  soft relaxed abdomen with good muscle support. Normoactive bowel sounds. Liver edge at costal margin. No focal tenderness. Rectal:  normal perianal area was normal rectal sphincter tone and small amount of soft Hemoccult-negative stool in the rectal ampulla. There is no obvious prolapse of the rectum. Extremities:  No clubbing, cyanosis, edema or deformities noted. Skin:  Intact without significant lesions or rashes. Psych:  Alert and cooperative. Normal mood and affect.   Impression & Recommendations:  Problem # 1:  IRRITABLE BOWEL SYNDROME (ICD-564.1) Patient has a history of IBS now with signs of pelvic relaxation. We have discussed options to improve her bowel function. She will start her on a probiotic one a day. She would prefer to try the pessary for several months before making a final decision about her pelvic surgery. It has been 7 years since her last colonoscopy and I would recommend for her to undergo a colonoscopy before her pelvic surgery. We will also proceed with an upper abdominal ultrasound to rule out symptomatic  gallbladder disease which is prevalent in her family.  Orders: Colonoscopy (Colon) Ultrasound Abdomen (UAS)  Patient Instructions: 1)  You have been scheduled for an abdominal ultrasound on 07/11/10 @ 9 am at [facility]. Please arrive at 8:45 am for registration. 2)  You have been scheduled for a colonoscopy with Dr Juanda Chance on 07/18/10 @ 1:30 pm. Please arrive at 12:30 pm for registration. 3)  You have been given Align samples to take 1 capsule by mouth once daily. If this works well, you may get this over the counter. 4)  Copy sent to : Berniece Andreas, MD. Dr Shela Commons.[place] 5)  The medication list was reviewed and reconciled.  All changed / newly prescribed medications were explained.  A complete medication list was provided to the patient / caregiver. Prescriptions: DULCOLAX 5 MG  TBEC (BISACODYL) Day before procedure take 2 at 3pm and 2 at 8pm.  #4 x 0   Entered by:   Lamona Curl CMA (AAMA)   Authorized by:   Hart Carwin MD   Signed by:   Lamona Curl CMA (AAMA) on 07/10/2010   Method used:   Electronically to  [address] [phone]* (retail)       [address] [place], [place]  [address]       Ph: [phone]       Fax: [phone]   RxID:   [id number] REGLAN 10 MG  TABS (METOCLOPRAMIDE HCL) As per prep instructions.  #2 x 0   Entered by:   Lamona Curl CMA (AAMA)   Authorized by:   Hart Carwin MD   Signed by:   Lamona Curl CMA (AAMA) on 07/10/2010   Method used:   Electronically to        [facility] [phone]* (retail)       [address] [place], [place]  [address]       Ph: [phone]       Fax: [phone]   RxID:   [id number] MIRALAX   POWD (POLYETHYLENE GLYCOL 3350) As per prep  instructions.  #255 grams x 0   Entered by:   Lamona Curl CMA (AAMA)   Authorized by:   Hart Carwin MD   Signed by:   Lamona Curl CMA (AAMA) on 07/10/2010   Method used:   Electronically to        [facility] [phone]* (retail)       [address] [place], [place]  [address]       Ph: [phone]       Fax: [phone]   RxID:   [id number]

## 2010-10-22 NOTE — Assessment & Plan Note (Signed)
Summary: vertigo/dm   Vital Signs:  Patient profile:   70 year old female Menstrual status:  hysterectomy Weight:      125 pounds Pulse rate:   72 / minute BP sitting:   132 / 62  (right arm) Cuff size:   regular  Vitals Entered By: Romualdo Bolk, CMA (AAMA) (May 21, 2010 11:27 AM) CC: On 8/27 woke up and feeling dizzy in the am. She had some coffee then went back to bed woke up and it was over. Pt states that she felt like she had some pressure on her head.  LMP - Character: age 66-hyst Menarche (age onset years): 11    Menstrual Status hysterectomy   History of Present Illness: Angela Burgess  comes in today  for episode of dizzy like vertigo  slight nausea.  and weak and light headed.    owrse with movign head .  No hearing or vision changes.   No local weakness.  onset when awaoke 3 days ago and better after a nap.? if had a mild HA with this. No rx .   baby asa helps her sleep some.     ? if could be stress recently.   recent health issues  no hx    on flonase    no flaring .  No new meds .  Currently  ? if had some today.   HA   small last pm.     Preventive Screening-Counseling & Management  Alcohol-Tobacco     Alcohol drinks/day: <1     Alcohol type: Wine or Beer     Smoking Status: never  Caffeine-Diet-Exercise     Caffeine use/day: 3 cups per day.      Does Patient Exercise: yes     Type of exercise: curves      Times/week: 3  Current Medications (verified): 1)  Levothyroxine Sodium 112 Mcg Tabs (Levothyroxine Sodium) .[name].. 1 By Mouth Once Daily 2)  Folic Acid 1 Mg  Tabs (Folic Acid) 3)  Multi-Vitamin   Tabs (Multiple Vitamin) 4)  Calcium 500/d 500-125 Mg-Unit  Tabs (Calcium Carbonate-Vitamin D) 5)  Fish Oil   Oil (Fish Oil) 6)  Flonase 50 Mcg/act  Susp (Fluticasone Propionate) 7)  Lutein 20 Mg Caps (Lutein) 8)  Magnesium Aspartate 65 Mg Tabs (Magnesium)  Allergies (verified): 1)  ! Tetracycline  Past History:  Past medical, surgical, family  and social histories (including risk factors) reviewed for relevance to current acute and chronic problems.  Past Medical History: Reviewed history from 10/24/2008 and no changes required. 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 Marjie Skiff Dr. Mia Creek Dr. Gabriel Cirri Dr. Stasia Cavalier  Past Surgical History: Reviewed history from 11/13/2009 and no changes required. Hysterectomy age 50 ? cancer ?  Past History:  Care Management: podiatrist Sprinkle  [place]. GYNE: [facility]  [facility].   Family History: Reviewed history from 10/24/2008 and no changes required. see old chart  fa leukemia   dm cad mom cirrhosis, stones son alcohol Father: Leukemia, heart attacks first at age 57 , diabetes Mother: Cirrhosis, Kidney Stones, protein allergy that sent poison to her brain, Siblings: stroke, bipolar (sister), thyroid problems, anemias, lung cancer, seizures Sibs with heart murmurs.    Social History: Reviewed history from  11/13/2009 and no changes required. Retired Married Never Smoked Alcohol   1-2 per months  HH of   2  no pets   Caffeine 3 per day .       Review of Systems  The patient denies anorexia, fever, weight loss, weight gain, vision loss, hoarseness, chest pain, syncope, dyspnea on exertion, peripheral edema, prolonged cough, hemoptysis, melena, hematochezia, severe indigestion/heartburn, hematuria, muscle weakness, transient blindness, difficulty walking, depression, abnormal bleeding, enlarged lymph nodes, and angioedema.    Physical Exam  General:  Well-developed,well-nourished,in no acute distress; alert,appropriate and cooperative throughout examination Head:  normocephalic, atraumatic, and no abnormalities observed.     Eyes:  PERRL, EOMs full, conjunctiva clear  Ears:  R ear normal, L ear normal, and no external deformities.   Nose:  no external deformity.  mild congestion face nontender Mouth:  pharynx pink and moist.   Neck:  No deformities, masses, or tenderness noted. no bruits  Lungs:  Normal respiratory effort, chest expands symmetrically. Lungs are clear to auscultation, no crackles or wheezes. Heart:  Normal rate and regular rhythm. S1 and S2 normal without gallop, murmur, click, rub or other extra sounds. Abdomen:  Bowel sounds positive,abdomen soft and non-tender without masses, organomegaly or   noted. Pulses:  pulses intact without delay   Extremities:  no clubbing cyanosis or edema  Neurologic:  alert & oriented X3, cranial nerves IIi-XII intact, and strength normal in all extremities.   gait normal, DTRs symmetrical and normal, finger-to-nose normal, and Romberg negative.   Skin:  turgor normal, no ecchymoses, and no petechiae.   Cervical Nodes:  No lymphadenopathy noted Psych:  Oriented X3, normally interactive, good eye contact, not anxious appearing, and not depressed appearing.     Impression & Recommendations:  Problem # 1:  VERTIGO (ICD-780.4) Assessment New ? vestibular by hx and no evidence of neuro deficit or vascular cause.  .  poss allergic rhinitis contributing .  poss positional vertigo .  follow up if persistent or  progressive  or other new signs  ok to get immuniz today.  Problem # 2:  RHINITIS (ICD-472.0) see above  Complete Medication List: 1)  Levothyroxine Sodium 112 Mcg Tabs (Levothyroxine sodium) .[name].. 1 by mouth once daily 2)  Folic Acid 1 Mg Tabs (Folic acid) 3)  Multi-vitamin Tabs (Multiple vitamin) 4)  Calcium 500/d 500-125 Mg-unit Tabs (Calcium carbonate-vitamin d) 5)  Fish Oil Oil (Fish oil) 6)  Flonase 50 Mcg/act Susp (Fluticasone propionate) 7)  Lutein 20 Mg Caps (Lutein) 8)  Magnesium Aspartate 65 Mg Tabs (Magnesium)  Other Orders: Zoster (Shingles)  Vaccine Live (16109) Admin 1st Vaccine (60454) Flu Vaccine 54yrs + (09811)  Patient Instructions: 1)  increase 2 spray of flonase  2)  try OTC claritin  zyrtec or allegra in case allergy  is contributing.  3)  this may be inner ear vertigo.   4)  call if persistent or  progressive   .   Immunizations Administered:  Zostavax # 1:    Vaccine Type: Zostavax    Site: right deltoid    Mfr: Merck    Dose: 0.5 ml    Route: Ilion    Given by: Romualdo Bolk, CMA (AAMA)    Exp. Date: 06/10/2011    Lot #: [id number]    Flu Vaccine Consent Questions     Do you have a history of severe allergic reactions to this vaccine? no    Any prior history of allergic reactions to egg and/or gelatin?  no    Do you have a sensitivity to the preservative Thimersol? no    Do you have a past history of Guillan-Barre Syndrome? no    Do you currently have an acute febrile illness? no    Have you ever had a severe reaction to latex? no    Vaccine information given and explained to patient? yes    Are you currently pregnant? no    Lot Number:[id number]   Exp Date:03/22/2011   Site Given  Left Deltoid IM  Romualdo Bolk, CMA (AAMA)  May 21, 2010 1:12 PM

## 2010-10-22 NOTE — Assessment & Plan Note (Signed)
Summary: pt will come in fasting/njr   Vital Signs:  Patient profile:   70 year old female Weight:      126 pounds BMI:     21.21 Temp:     97.5 degrees F oral Pulse rate:   60 / minute Pulse rhythm:   regular BP sitting:   102 / 62  (left arm) Cuff size:   regular  Vitals Entered By: Raechel Ache, RN (November 13, 2009 9:30 AM) CC: OV, fasting. Sees gyn.   History of Present Illness: Angela Burgess comes  on for  yearly visit. for medical conditions   and med check  LIPIDs: exercises and tried to eat healthy   GYNE:  Pelvic organ prolapse. considering surgery  Nose bleeds no change  pinched nerve in toe  has seen podiatry  EYE beginning catatracts.   Thyroid :  No problems  taking same  meds  Osteopenia : exercise and calcium vit d     recent Bone density reported as stable OA : no change     Preventive Screening-Counseling & Management  Alcohol-Tobacco     Alcohol drinks/day: <1     Alcohol type: Wine or Beer     Smoking Status: never  Caffeine-Diet-Exercise     Caffeine use/day: 3 cups per day.      Does Patient Exercise: yes     Type of exercise: curves      Times/week: 3  Hep-HIV-STD-Contraception     Dental Visit-last 6 months yes     Sun Exposure-Excessive: no  Safety-Violence-Falls     Seat Belt Use: yes     Firearms in the Home: firearms in the home     Firearm Counseling: not indicated; uses recommended firearm safety measures     Smoke Detectors: yes      Blood Transfusions:  no.        Travel History:  [place] and [place] and [place].    Allergies: 1)  ! Tetracycline  Past History:  Past medical, surgical, family and social histories (including risk factors) reviewed, and no changes noted (except as noted below).  Past Medical History: Reviewed history from 10/24/2008 and no changes required. Hypothyroidism macrocytosis  with heme eval in past and normal b12  rosacea  g2p2        LAST Mammogram: 1/10 Pap: hyst Td:  1999 Colonscopy: 04/24/2003  EKG: 2005 Dexa:2008 Eye Exam: 6/09 Other: Smoking: Never  Consult Dr. Marjie Skiff Dr. Mia Creek Dr. Gabriel Cirri Dr. Stasia Cavalier  Past Surgical History: Hysterectomy age 63 ? cancer ?  Past History:  Care Management: podiatrist Sprinkle  [facility]. GYNE: [facility]  [facility].   Family History: Reviewed history from 10/24/2008 and no changes required. see old chart  fa leukemia   dm cad mom cirrhosis, stones son alcohol Father: Leukemia, heart attacks first at age 68 , diabetes Mother: Cirrhosis, Kidney Stones, protein allergy that sent poison to her brain, Siblings: stroke, bipolar (sister), thyroid problems, anemias, lung cancer, seizures Sibs with heart murmurs.    Social History: Reviewed history from 10/24/2008 and no changes required. Retired Married Never Smoked Alcohol   1-2 per months  HH of   2  no pets   Caffeine 3 per day .     Caffeine use/day:  3 cups per day.  Does Patient Exercise:  yes Dental Care w/in 6 mos.:  yes Seat Belt Use:  yes Sun Exposure-Excessive:  no Blood Transfusions:  no  Review  of Systems  The patient denies anorexia, fever, weight loss, weight gain, vision loss, decreased hearing, hoarseness, chest pain, syncope, dyspnea on exertion, prolonged cough, hemoptysis, abdominal pain, melena, hematochezia, severe indigestion/heartburn, hematuria, transient blindness, difficulty walking, depression, abnormal bleeding, enlarged lymph nodes, and angioedema.         no regular cp sob  except with uri  .   big calcium pill  pills get caught  Physical Exam General Appearance: well developed, well nourished, no acute distress Eyes: conjunctiva and lids normal, PERRLA, EOMI,  WNL glasses Ears, Nose, Mouth, Throat: TM clear, nares clear, oral exam WNL Neck: supple, no lymphadenopathy, no thyromegaly, no JVD Respiratory: clear to auscultation and percussion, respiratory  effort normal Cardiovascular: regular rate and rhythm, S1-S2, no murmur, rub or gallop, no bruits, peripheral pulses normal and symmetric, no cyanosis, clubbing, edema   has varicose veins  no ulcer seen Chest: no scars, masses, tenderness; no asymmetry, skin changes, nipple discharge   Gastrointestinal: soft, non-tender; no hepatosplenomegaly, masses; active bowel sounds all quadrants,  Genitourinary: per gyne Lymphatic: no cervical, axillary or inguinal adenopathy Musculoskeletal: gait normal, muscle tone and strength WNL, no joint swelling, effusions, discoloration, crepitus  Skin: clear, good turgor, color WNL, no rashes, lesions, or ulcerations Neurologic: normal mental status, normal reflexes, normal strength, sensation, and motion Psychiatric: alert; oriented to person, place and time Other Exam:     Impression & Recommendations:  Problem # 1:  HYPOTHYROIDISM (ICD-244.9)  Her updated medication list for this problem includes:    Levothyroxine Sodium 112 Mcg Tabs (Levothyroxine sodium) .[name]... 1 by mouth once daily  Orders: TLB-TSH (Thyroid Stimulating Hormone) (84443-TSH) TLB-BMP (Basic Metabolic Panel-BMET) (80048-METABOL) Venipuncture (40102) Prescription Created Electronically [phone])  Labs Reviewed: TSH: 1.09 (01/09/2009)    Chol: 208 (10/24/2008)   HDL: 46.5 (10/24/2008)   LDL: DEL (10/24/2008)   TG: 119 (10/24/2008)  Problem # 2:  HYPERLIPIDEMIA (ICD-272.2)  Orders: TLB-Hepatic/Liver Function Pnl (80076-HEPATIC) TLB-Lipid Panel (80061-LIPID) TLB-BMP (Basic Metabolic Panel-BMET) (80048-METABOL) Venipuncture (64403)  Labs Reviewed: SGOT: 22 (10/24/2008)   SGPT: 17 (10/24/2008)  Lipid Goals: Chol Goal: 200 (01/31/2008)   HDL Goal: 40 (01/31/2008)   LDL Goal: 130 (01/31/2008)   TG Goal: 150 (01/31/2008)  Prior 10 Yr Risk Heart Disease: Not enough information (01/31/2008)   HDL:46.5 (10/24/2008), 39.7 (01/24/2008)  LDL:DEL (10/24/2008), DEL (01/24/2008)  Chol:208  (10/24/2008), 204 (01/24/2008)  Trig:119 (10/24/2008), 110 (01/24/2008)  Problem # 3:  OSTEOPENIA (ICD-733.90) dexa reported stable Her updated medication list for this problem includes:    Calcium 500/d 500-125 Mg-unit Tabs (Calcium carbonate-vitamin d)  Orders: TLB-BMP (Basic Metabolic Panel-BMET) (80048-METABOL)  Problem # 4:  OTHER SPEC DISEASES BLOOD&BLOOD-FORMING ORGANS (ICD-289.89) elevateed mcv for years and prev hem consult years ago  Orders: TLB-CBC Platelet - w/Differential (85025-CBCD)  Problem # 5:  OSTEOARTHRITIS (ICD-715.90) stable   Problem # 6:  VARICOSE VEINS, LOWER EXTREMITIES (ICD-454.9) seeing Dr Raechel Chute.   Problem # 7:  Preventive Health Care (ICD-V70.0) disc  td vs tdap  will go ahead and do tdap.  disc zoztavax will call for after looking into payment reimbursement.   Problem # 8:  URI (ICD-465.9) no residual abn of exam     fu  as needed.   Complete Medication List: 1)  Levothyroxine Sodium 112 Mcg Tabs (Levothyroxine sodium) .[name].. 1 by mouth once daily 2)  Folic Acid 1 Mg Tabs (Folic acid) 3)  Multi-vitamin Tabs (Multiple vitamin) 4)  Calcium 500/d 500-125 Mg-unit Tabs (Calcium carbonate-vitamin d) 5)  Fish  Oil Oil (Fish oil) 6)  Msm 1000 Mg Caps (Methylsulfonylmethane) 7)  Flonase 50 Mcg/act Susp (Fluticasone propionate) 8)  Lutein 20 Mg Caps (Lutein) 9)  Magnesium Aspartate 65 Mg Tabs (Magnesium) 10)  Tobradex 0.3-0.1 % Susp (Tobramycin-dexamethasone) .... 2 drops in eye q 4 hours as needed  Other Orders: Tdap => 10yrs IM (16109) Admin 1st Vaccine (60454)  Patient Instructions: 1)  You will be informed of lab results when available. 2)  then plan follow up  3)  Continue healthy lifestyle . Prescriptions: LEVOTHYROXINE SODIUM 112 MCG TABS (LEVOTHYROXINE SODIUM) 1 by mouth once daily  #90 Each x 3   Entered and Authorized by:   Madelin Headings MD   Signed by:   Madelin Headings MD on 11/13/2009   Method used:   Electronically to        [facility] [phone]* (retail)       [address] [place], [place]  [address]       Ph: [phone]       Fax: [phone]   RxID:   [id number]    Immunizations Administered:  Tetanus Vaccine:    Vaccine Type: Tdap    Site: right deltoid    Mfr: GlaxoSmithKline    Dose: 0.5 ml    Route: IM    Given by: Raechel Ache, RN    Exp. Date: 07/18/2010    Lot #: [id number]    VIS given: 08/10/07 version given November 13, 2009.

## 2010-10-22 NOTE — Letter (Signed)
Summary: [facility] Instructions  [facility]  [address] [place], [place] [address]   Phone: [phone]  Fax: [phone]       Angela Burgess    [birth year]    MRN: [id number]       Procedure Day /Date: Thursday 07/18/10      Arrival Time: 12:30 pm     Procedure Time: 1:30 pm     Location of Procedure:                    _ x_  [facility] (4th Floor)  PREPARATION FOR COLONOSCOPY WITH MIRALAX  Starting 5 days prior to your procedure 07/13/10 do not eat nuts, seeds, popcorn, corn, beans, peas,  salads, or any raw vegetables.  Do not take any fiber supplements (e.g. Metamucil, Citrucel, and Benefiber). ____________________________________________________________________________________________________   THE DAY BEFORE YOUR PROCEDURE         DATE: 07/17/10 DAY: Wednesday  1   Drink clear liquids the entire day-NO SOLID FOOD  2   Do not drink anything colored red or purple.  Avoid juices with pulp.  No orange juice.  3   Drink at least 64 oz. (8 glasses) of fluid/clear liquids during the day to prevent dehydration and help the prep work efficiently.  CLEAR LIQUIDS INCLUDE: Water Jello Ice Popsicles Tea (sugar ok, no milk/cream) Powdered fruit flavored drinks Coffee (sugar ok, no milk/cream) Gatorade Juice: apple, white grape, white cranberry  Lemonade Clear bullion, consomm, broth Carbonated beverages (any kind) Strained chicken noodle soup Hard Candy  4   Mix the entire bottle of Miralax with 64 oz. of Gatorade/Powerade in the morning and put in the refrigerator to chill.  5   At 3:00 pm take 2 Dulcolax/Bisacodyl tablets.  6   At 4:30 pm take one Reglan/Metoclopramide tablet.  7  Starting at 5:00 pm drink one 8 oz glass of the Miralax mixture every 15-20 minutes until you have finished drinking the entire 64 oz.  You should finish drinking prep around 7:30 or 8:00 pm.  8   If you are nauseated, you may take the 2nd Reglan/Metoclopramide  tablet at 6:30 pm.        9    At 8:00 pm take 2 more DULCOLAX/Bisacodyl tablets.         THE DAY OF YOUR PROCEDURE      DATE:  07/18/10 DAY: Thursday  You may drink clear liquids until 11:30 am  (2 HOURS BEFORE PROCEDURE).   MEDICATION INSTRUCTIONS  Unless otherwise instructed, you should take regular prescription medications with a small sip of water as early as possible the morning of your procedure.        OTHER INSTRUCTIONS  You will need a responsible adult at least 71 years of age to accompany you and drive you home.   This person must remain in the waiting room during your procedure.  Wear loose fitting clothing that is easily removed.  Leave jewelry and other valuables at home.  However, you may wish to bring a book to read or an iPod/MP3 player to listen to music as you wait for your procedure to start.  Remove all body piercing jewelry and leave at home.  Total time from sign-in until discharge is approximately 2-3 hours.  You should go home directly after your procedure and rest.  You can resume normal activities the day after your procedure.  The day of your procedure you should not:   Drive  Make legal decisions   Operate machinery   Drink alcohol   Return to work  You will receive specific instructions about eating, activities and medications before you leave.   The above instructions have been reviewed and explained to me by  Lamona Curl CMA Duncan Dull)  July 10, 2010 8:56 AM     I fully understand and can verbalize these instructions _____________________________ Date 07/10/10

## 2010-12-09 ENCOUNTER — Other Ambulatory Visit: Payer: Self-pay | Admitting: Internal Medicine

## 2011-01-02 ENCOUNTER — Ambulatory Visit: Payer: Medicare Other | Attending: Internal Medicine | Admitting: Physical Therapy

## 2011-01-02 DIAGNOSIS — IMO0001 Reserved for inherently not codable concepts without codable children: Secondary | ICD-10-CM | POA: Insufficient documentation

## 2011-01-02 DIAGNOSIS — M629 Disorder of muscle, unspecified: Secondary | ICD-10-CM | POA: Insufficient documentation

## 2011-01-02 DIAGNOSIS — N3945 Continuous leakage: Secondary | ICD-10-CM | POA: Insufficient documentation

## 2011-01-02 DIAGNOSIS — M2420 Disorder of ligament, unspecified site: Secondary | ICD-10-CM | POA: Insufficient documentation

## 2011-01-02 DIAGNOSIS — M25559 Pain in unspecified hip: Secondary | ICD-10-CM | POA: Insufficient documentation

## 2011-01-13 ENCOUNTER — Telehealth: Payer: Self-pay | Admitting: *Deleted

## 2011-01-13 NOTE — Telephone Encounter (Signed)
Received fax from pharmacy saying that pt needs a refill on levothroid. Rx was sent on 3/19 #90 with 1 refill. Fax was sent back saying this.

## 2011-01-14 ENCOUNTER — Ambulatory Visit: Payer: Medicare Other | Admitting: Physical Therapy

## 2011-01-21 ENCOUNTER — Ambulatory Visit: Payer: Medicare Other | Attending: Internal Medicine | Admitting: Physical Therapy

## 2011-01-21 DIAGNOSIS — M25559 Pain in unspecified hip: Secondary | ICD-10-CM | POA: Insufficient documentation

## 2011-01-21 DIAGNOSIS — N3945 Continuous leakage: Secondary | ICD-10-CM | POA: Insufficient documentation

## 2011-01-21 DIAGNOSIS — IMO0001 Reserved for inherently not codable concepts without codable children: Secondary | ICD-10-CM | POA: Insufficient documentation

## 2011-01-21 DIAGNOSIS — M2420 Disorder of ligament, unspecified site: Secondary | ICD-10-CM | POA: Insufficient documentation

## 2011-01-21 DIAGNOSIS — M629 Disorder of muscle, unspecified: Secondary | ICD-10-CM | POA: Insufficient documentation

## 2011-02-11 ENCOUNTER — Ambulatory Visit: Payer: Medicare Other | Admitting: Physical Therapy

## 2011-02-25 ENCOUNTER — Ambulatory Visit: Payer: Medicare Other | Attending: Internal Medicine | Admitting: Physical Therapy

## 2011-02-25 DIAGNOSIS — N3945 Continuous leakage: Secondary | ICD-10-CM | POA: Insufficient documentation

## 2011-02-25 DIAGNOSIS — M2420 Disorder of ligament, unspecified site: Secondary | ICD-10-CM | POA: Insufficient documentation

## 2011-02-25 DIAGNOSIS — M25559 Pain in unspecified hip: Secondary | ICD-10-CM | POA: Insufficient documentation

## 2011-02-25 DIAGNOSIS — M629 Disorder of muscle, unspecified: Secondary | ICD-10-CM | POA: Insufficient documentation

## 2011-02-25 DIAGNOSIS — IMO0001 Reserved for inherently not codable concepts without codable children: Secondary | ICD-10-CM | POA: Insufficient documentation

## 2011-03-18 ENCOUNTER — Ambulatory Visit: Payer: Medicare Other | Admitting: Physical Therapy

## 2011-03-25 ENCOUNTER — Other Ambulatory Visit (INDEPENDENT_AMBULATORY_CARE_PROVIDER_SITE_OTHER): Payer: Medicare Other

## 2011-03-25 DIAGNOSIS — D7589 Other specified diseases of blood and blood-forming organs: Secondary | ICD-10-CM

## 2011-03-25 DIAGNOSIS — E782 Mixed hyperlipidemia: Secondary | ICD-10-CM

## 2011-03-25 DIAGNOSIS — Z0000 Encounter for general adult medical examination without abnormal findings: Secondary | ICD-10-CM

## 2011-03-25 DIAGNOSIS — E039 Hypothyroidism, unspecified: Secondary | ICD-10-CM

## 2011-03-25 LAB — POCT URINALYSIS DIPSTICK
Bilirubin, UA: NEGATIVE
Glucose, UA: NEGATIVE
Ketones, UA: NEGATIVE
Leukocytes, UA: NEGATIVE
Nitrite, UA: NEGATIVE
Protein, UA: NEGATIVE
Spec Grav, UA: 1.015
Urobilinogen, UA: 0.2
pH, UA: 7

## 2011-03-25 LAB — HEPATIC FUNCTION PANEL
ALT: 16 U/L (ref 0–35)
AST: 22 U/L (ref 0–37)
Albumin: 4.2 g/dL (ref 3.5–5.2)
Alkaline Phosphatase: 59 U/L (ref 39–117)
Bilirubin, Direct: 0.1 mg/dL (ref 0.0–0.3)
Total Bilirubin: 0.7 mg/dL (ref 0.3–1.2)
Total Protein: 6.3 g/dL (ref 6.0–8.3)

## 2011-03-25 LAB — BASIC METABOLIC PANEL
BUN: 18 mg/dL (ref 6–23)
CO2: 32 mEq/L (ref 19–32)
Calcium: 9.4 mg/dL (ref 8.4–10.5)
Chloride: 107 mEq/L (ref 96–112)
Creatinine, Ser: 0.7 mg/dL (ref 0.4–1.2)
GFR: 90.75 mL/min (ref >60.00)
Glucose, Bld: 92 mg/dL (ref 70–99)
Potassium: 4.9 mEq/L (ref 3.5–5.1)
Sodium: 142 mEq/L (ref 135–145)

## 2011-03-25 LAB — CBC WITH DIFFERENTIAL/PLATELET
Basophils Absolute: 0 10*3/uL (ref 0.0–0.1)
Basophils Relative: 0.8 % (ref 0.0–3.0)
Eosinophils Absolute: 0.1 10*3/uL (ref 0.0–0.7)
Eosinophils Relative: 1.4 % (ref 0.0–5.0)
HCT: 36.4 % (ref 36.0–46.0)
Hemoglobin: 12.8 g/dL (ref 12.0–15.0)
Lymphocytes Relative: 39.7 % (ref 12.0–46.0)
Lymphs Abs: 1.5 10*3/uL (ref 0.7–4.0)
MCHC: 35.3 g/dL (ref 30.0–36.0)
MCV: 107 fl — ABNORMAL HIGH (ref 78.0–100.0)
Monocytes Absolute: 0.3 10*3/uL (ref 0.1–1.0)
Monocytes Relative: 7.4 % (ref 3.0–12.0)
Neutro Abs: 1.9 10*3/uL (ref 1.4–7.7)
Neutrophils Relative %: 50.7 % (ref 43.0–77.0)
Platelets: 188 10*3/uL (ref 150.0–400.0)
RBC: 3.4 Mil/uL — ABNORMAL LOW (ref 3.87–5.11)
RDW: 12.2 % (ref 11.5–14.6)
WBC: 3.8 10*3/uL — ABNORMAL LOW (ref 4.5–10.5)

## 2011-03-25 LAB — LIPID PANEL
Cholesterol: 216 mg/dL — ABNORMAL HIGH (ref 0–200)
HDL: 66.9 mg/dL (ref >39.00)
Total CHOL/HDL Ratio: 3
Triglycerides: 84 mg/dL (ref 0.0–149.0)
VLDL: 16.8 mg/dL (ref 0.0–40.0)

## 2011-03-25 LAB — LDL CHOLESTEROL, DIRECT: Direct LDL: 147.7 mg/dL

## 2011-03-25 LAB — TSH: TSH: 1.2 u[IU]/mL (ref 0.35–5.50)

## 2011-04-02 ENCOUNTER — Encounter: Payer: Self-pay | Admitting: Internal Medicine

## 2011-04-02 ENCOUNTER — Ambulatory Visit (INDEPENDENT_AMBULATORY_CARE_PROVIDER_SITE_OTHER): Payer: Medicare Other | Admitting: Internal Medicine

## 2011-04-02 VITALS — BP 110/62 | HR 80 | Temp 97.8°F | Ht 64.5 in | Wt 126.0 lb

## 2011-04-02 DIAGNOSIS — D7589 Other specified diseases of blood and blood-forming organs: Secondary | ICD-10-CM

## 2011-04-02 DIAGNOSIS — E039 Hypothyroidism, unspecified: Secondary | ICD-10-CM

## 2011-04-02 DIAGNOSIS — K2970 Gastritis, unspecified, without bleeding: Secondary | ICD-10-CM

## 2011-04-02 DIAGNOSIS — K589 Irritable bowel syndrome without diarrhea: Secondary | ICD-10-CM

## 2011-04-02 DIAGNOSIS — E782 Mixed hyperlipidemia: Secondary | ICD-10-CM

## 2011-04-02 DIAGNOSIS — Z0000 Encounter for general adult medical examination without abnormal findings: Secondary | ICD-10-CM | POA: Insufficient documentation

## 2011-04-02 DIAGNOSIS — K2990 Gastroduodenitis, unspecified, without bleeding: Secondary | ICD-10-CM

## 2011-04-02 DIAGNOSIS — M1990 Unspecified osteoarthritis, unspecified site: Secondary | ICD-10-CM

## 2011-04-02 NOTE — Progress Notes (Signed)
Subjective:    Patient ID: Angela Burgess, female    DOB: 1941-08-26, 70 y.o.   MRN: [id number]  HPI Patient comes in today for her yearly preventive visit and medical management. Since her last visit she has done fairly well however has a couple of issues. Stomach bothering her.  At times  A nervous stomach.  Burning off an on.   mylanta some help.  Going on  For about 1 year.  Rectocele  And prolapse   Using a pessary and using Kegels.  And doing PT. Some help.    FU pap ? If abnormal but nl at FU.  Hips bursitis helps with some types of exercise.  Saw Dr. Jenean Lindau April Dr. Juanda Chance in January ENT  in April Review of Systems ROS:  GEN/ HEENTNo fever, significant weight changes sweats headaches vision problems hearing changes, CV/ PULM; No chest pain shortness of breath cough, syncope,edema  change in exercise tolerance. ocass orthostatic.  dizziness.  When sitting a while no exercise itolerance goes to curves GI /GU: No adominal pain, vomiting, change in bowel habits. No blood in the stool. No significant GU symptoms. SKIN/HEME: ,no acute skin rashes suspicious lesions or bleeding. No lymphadenopathy, nodules, masses.  NEURO/ PSYCH:  No neurologic signs such as weakness numbness No depression anxiety. IMM/ Allergy: No unusual infections.  Allergy .   REST of 12 system review negative Questionnaire Medicare reviewed  Hearing: ok   Vision:  No limitations at present . Wears glasses last checkup [facility] on a summer 2011 does not have glaucoma  Safety:  Has smoke detector and wears seat belts.  No firearms. No excess sun exposure. Sees dentist regularly.  Falls: none   Advance directive :  Reviewed  Has one.  Memory: Felt to be good  , no concern from her or her family.  Depression: No anhedonia unusual crying or depressive symptoms  Nutrition: Eats well balanced diet; adequate calcium and vitamin D. No swallowing chewiing problems. Exercises at curves   Injury: no major  injuries in the last six months.  Other healthcare providers:  Reviewed today .  Social:  Lives with husband married. No pets.  hhof 2   Preventive parameters: up-to-date on colonoscopy, mammogram, immunizations. Including Tdap and pneumovax.  ADLS:   There are no problems or need for assistance  driving, feeding, obtaining food, dressing, toileting and bathing, managing money using phone. She is independent.  Past history family history social history reviewed in the electronic medical record.      Objective:   Physical Exam Physical Exam: Vital signs reviewed WUJ:WJXB is a well-developed well-nourished alert cooperative  white female who appears her stated age in no acute distress.  HEENT: normocephalic  traumatic , Eyes: PERRL EOM's full, conjunctiva clear, Nares: paten,t no deformity discharge or tenderness., Ears: no deformity EAC's clear TMs with normal landmarks. Mouth: clear OP, no lesions, edema.  Moist mucous membranes. Dentition in adequate repair. NECK: supple without masses, thyromegaly or bruits. CHEST/PULM:  Clear to auscultation and percussion breath sounds equal no wheeze , rales or rhonchi. No chest wall deformities or tenderness. Breast: normal by inspection . No dimpling, discharge, masses, tenderness or discharge . LN: no cervical axillary inguinal adenopathy  CV: PMI is nondisplaced, S1 S2 no gallops, murmurs, rubs. Peripheral pulses are full without delay.No JVD .  ABDOMEN: Bowel sounds normal nontender  No guard or rebound, no hepato splenomegal no CVA tenderness.  No hernia. Extremtities:  No clubbing cyanosis or edema, no  acute joint swelling or redness no focal atrophy NEURO:  Oriented x3, cranial nerves 3-12 appear to be intact, no obvious focal weakness,gait within normal limits no abnormal reflexes or asymmetrical SKIN: No acute rashes normal turgor, color, no bruising or petechiae. PSYCH: Oriented, good eye contact, no obvious depression anxiety, cognition  and judgment appear normal. Labs reviewed      Assessment & Plan:  Preventive Health Care Counseled regarding healthy nutrition, exercise, sleep, injury prevention, calcium vit d and healthy weight .  UTD   LIPIDS  Good ratio  GI stomach. Sx  ? If ibs or gastritis   Taking antacids    See Dr Juanda Chance  To assess necessity of other intervention or meds or just needing management. Inc MCV the same without anemia if progressive see heme again.  Thyroid disease stable OA stable no

## 2011-04-02 NOTE — Patient Instructions (Signed)
Continue lifestyle intervention healthy eating and exercise . See Dr Juanda Chance about the GI sx . Otherwise  Yearly check with blood work.

## 2011-04-08 ENCOUNTER — Encounter: Payer: Medicare Other | Admitting: Physical Therapy

## 2011-05-12 ENCOUNTER — Ambulatory Visit (INDEPENDENT_AMBULATORY_CARE_PROVIDER_SITE_OTHER): Payer: Medicare Other | Admitting: Internal Medicine

## 2011-05-12 ENCOUNTER — Encounter: Payer: Self-pay | Admitting: Internal Medicine

## 2011-05-12 VITALS — BP 122/58 | HR 72 | Ht 64.5 in | Wt 125.6 lb

## 2011-05-12 DIAGNOSIS — R1013 Epigastric pain: Secondary | ICD-10-CM

## 2011-05-12 DIAGNOSIS — K3189 Other diseases of stomach and duodenum: Secondary | ICD-10-CM

## 2011-05-12 NOTE — Progress Notes (Signed)
Angela Burgess [birth year] MRN [id number]    History of Present Illness:  This is a 70 year old white female with irritable bowel syndrome. She has a history of a rectocele and cystocele. She has decided against the repair by Dr Henderson Cloud and has had a pessary placed for pelvic support. She is here today because of increasing bloating and abdominal distention which occurs usually late in the afternoon; most often after supper. She often feels fullness and early satiety even before supper and more so after supper. She takes Mylanta on an as necessary basis which relieves her symptoms. She has a history of gastritis and H. pylori diagnosed on an upper endoscopy in 2004. There is a positive family history of gallbladder disease in her siblings and her mother. Her upper abdominal ultrasound in October 2011 was normal. Her last colonoscopy in October 2011 was also normal. Her weight has been stable.   Past Medical History  Diagnosis Date  . Hypothyroidism   . Macrocytosis     with theme eval in past and normal b12  . Rosacea   . Cervical cancer 1968  . History of mammogram 1/10  . Tetanus-diphtheria (Td) vaccination 1999  . Anemia   . History of normal resting EKG 2005  . Dexamethasone adverse reaction 2008  . Eye exam abnormal 6/09  . Arthritis   . IBS (irritable bowel syndrome)   . Gastritis   . Osteopenia   . Hyperlipidemia   . Rectocele   . Elevated MCV     for years  . Family history of malignant neoplasm of gastrointestinal tract    Past Surgical History  Procedure Date  . Appendectomy   . Abdominal hysterectomy   . Breast lumpectomy     reports that she has never smoked. She has never used smokeless tobacco. She reports that she drinks alcohol. She reports that she does not use illicit drugs. family history includes Allergy (severe) in her mother; Bipolar disorder in her sister and son; Cirrhosis in her mother; Colon cancer in her brother; Diabetes in her father; Heart attack  (age of onset:55) in her father; Kidney disease in her mother; Leukemia in her father; Lung cancer in her brother; Seizures in her brother and sisters; and Stroke in her sister. Allergies  Allergen Reactions  . Tetracycline         Review of Systems: He denies dysphagia, odynophagia, shortness of breath or chest pain, bowel habits have been regular  The remainder of the 10  point ROS is negative except as outlined in H&P   Physical Exam: General appearance  Well developed, in no distress. Eyes- non icteric. HEENT nontraumatic, normocephalic. Mouth no lesions, tongue papillated, no cheilosis. Neck supple without adenopathy, thyroid not enlarged, no carotid bruits, no JVD. Lungs Clear to auscultation bilaterally. Cor normal S1 normal S2, regular rhythm , no murmur,  quiet precordium. Abdomen soft nontender abdomen with normal active bowel sounds. No tympany or distention. Liver edge at costal margin. Rectal: Not done. Extremities no pedal edema. Skin no lesions. Neurological alert and oriented x 3. Psychological normal mood and affect.    Assessment and Plan:  Problem #1 Bloating and dyspepsia with suggestion of early satiety. Patient has irritable bowel syndrome. She may have slightly impaired gastric emptying. We need to consider the possibility of recurrent H. pylori. I doubt this is a biliary problem. I have asked her to continue Mylanta 30cc after supper. We discussed small frequent feedings. She should use probiotics daily. She could possibly  need a [employer] for recurrent  H. Pylori vsersus empirical treatment for H. pylori with Prevpac. She could also need a possible upper endoscopy. She would like to try the gastroparesis diet, probiotic and antacid over the next several weeks and will let us know how she does.   05/12/2011 Angela Burgess

## 2011-05-12 NOTE — Patient Instructions (Signed)
Dr Panosh 

## 2011-06-02 ENCOUNTER — Telehealth: Payer: Self-pay | Admitting: Internal Medicine

## 2011-06-02 MED ORDER — LEVOTHYROXINE SODIUM 112 MCG PO TABS: 112.0000 ug | ORAL_TABLET | Freq: Every day | ORAL | Status: DC

## 2011-06-02 NOTE — Telephone Encounter (Signed)
New pharmacy-------[facility]  in [place]. Please send in a new rx for Levothyroxine 112 mcg. Thanks.

## 2011-08-18 ENCOUNTER — Other Ambulatory Visit: Payer: Self-pay | Admitting: Internal Medicine

## 2012-02-23 ENCOUNTER — Other Ambulatory Visit: Payer: Self-pay | Admitting: Internal Medicine

## 2012-05-19 ENCOUNTER — Other Ambulatory Visit: Payer: Self-pay | Admitting: Internal Medicine

## 2012-07-21 ENCOUNTER — Encounter: Payer: Self-pay | Admitting: Internal Medicine

## 2012-07-21 ENCOUNTER — Ambulatory Visit (INDEPENDENT_AMBULATORY_CARE_PROVIDER_SITE_OTHER): Payer: Medicare Other | Admitting: Internal Medicine

## 2012-07-21 VITALS — BP 122/70 | HR 74 | Temp 98.6°F | Ht 64.5 in | Wt 124.0 lb

## 2012-07-21 DIAGNOSIS — K589 Irritable bowel syndrome without diarrhea: Secondary | ICD-10-CM

## 2012-07-21 DIAGNOSIS — Z0000 Encounter for general adult medical examination without abnormal findings: Secondary | ICD-10-CM

## 2012-07-21 DIAGNOSIS — M899 Disorder of bone, unspecified: Secondary | ICD-10-CM

## 2012-07-21 DIAGNOSIS — M949 Disorder of cartilage, unspecified: Secondary | ICD-10-CM

## 2012-07-21 DIAGNOSIS — Z79899 Other long term (current) drug therapy: Secondary | ICD-10-CM

## 2012-07-21 DIAGNOSIS — R718 Other abnormality of red blood cells: Secondary | ICD-10-CM

## 2012-07-21 DIAGNOSIS — M1990 Unspecified osteoarthritis, unspecified site: Secondary | ICD-10-CM

## 2012-07-21 DIAGNOSIS — E039 Hypothyroidism, unspecified: Secondary | ICD-10-CM

## 2012-07-21 DIAGNOSIS — E782 Mixed hyperlipidemia: Secondary | ICD-10-CM

## 2012-07-21 LAB — BASIC METABOLIC PANEL
BUN: 16 mg/dL (ref 6–23)
CO2: 30 mEq/L (ref 19–32)
Calcium: 9.2 mg/dL (ref 8.4–10.5)
Chloride: 105 mEq/L (ref 96–112)
Creatinine, Ser: 0.7 mg/dL (ref 0.4–1.2)
GFR: 88.9 mL/min (ref >60.00)
Glucose, Bld: 87 mg/dL (ref 70–99)
Potassium: 4.1 mEq/L (ref 3.5–5.1)
Sodium: 141 mEq/L (ref 135–145)

## 2012-07-21 LAB — CBC WITH DIFFERENTIAL/PLATELET
Basophils Absolute: 0 10*3/uL (ref 0.0–0.1)
Basophils Relative: 1 % (ref 0.0–3.0)
Eosinophils Absolute: 0.1 10*3/uL (ref 0.0–0.7)
Eosinophils Relative: 2.4 % (ref 0.0–5.0)
HCT: 37.3 % (ref 36.0–46.0)
Hemoglobin: 12.7 g/dL (ref 12.0–15.0)
Lymphocytes Relative: 33.8 % (ref 12.0–46.0)
Lymphs Abs: 1.4 10*3/uL (ref 0.7–4.0)
MCV: 106.5 fl — ABNORMAL HIGH (ref 78.0–100.0)
Monocytes Absolute: 0.3 10*3/uL (ref 0.1–1.0)
Monocytes Relative: 7.3 % (ref 3.0–12.0)
Neutro Abs: 2.3 10*3/uL (ref 1.4–7.7)
Neutrophils Relative %: 55.5 % (ref 43.0–77.0)
Platelets: 225 10*3/uL (ref 150.0–400.0)
RBC: 3.51 Mil/uL — ABNORMAL LOW (ref 3.87–5.11)
RDW: 11.9 % (ref 11.5–14.6)
WBC: 4.2 10*3/uL — ABNORMAL LOW (ref 4.5–10.5)

## 2012-07-21 LAB — HEMOGLOBIN A1C: Hgb A1c MFr Bld: 5 % (ref 4.6–6.5)

## 2012-07-21 LAB — HEPATIC FUNCTION PANEL
ALT: 19 U/L (ref 0–35)
AST: 22 U/L (ref 0–37)
Albumin: 3.7 g/dL (ref 3.5–5.2)
Alkaline Phosphatase: 63 U/L (ref 39–117)
Bilirubin, Direct: 0.1 mg/dL (ref 0.0–0.3)
Total Bilirubin: 0.8 mg/dL (ref 0.3–1.2)
Total Protein: 6.8 g/dL (ref 6.0–8.3)

## 2012-07-21 LAB — LIPID PANEL
Cholesterol: 233 mg/dL — ABNORMAL HIGH (ref 0–200)
HDL: 51.4 mg/dL (ref >39.00)
Total CHOL/HDL Ratio: 5
Triglycerides: 88 mg/dL (ref 0.0–149.0)
VLDL: 17.6 mg/dL (ref 0.0–40.0)

## 2012-07-21 LAB — LDL CHOLESTEROL, DIRECT: Direct LDL: 165.2 mg/dL

## 2012-07-21 LAB — TSH: TSH: 0.35 u[IU]/mL (ref 0.35–5.50)

## 2012-07-21 NOTE — Progress Notes (Signed)
Patient comes in today for preventive visit and follow-up of medical issues.Update  history since  last visit: Had colposcopy with Dr Henderson Cloud for help seeing PT for hip  Had check with  Dr Durenda Age  Labs  Jan 13 .   bg borderline 102 and elevated mcv and  acei 61 borderline high .   Hearing:  Ok   Vision:  No limitations at present . Glasses   Safety:  Has smoke detector and wears seat belts.  No firearms. No excess sun exposure. Sees dentist regularly.  Falls:  no  Advance directive :  Reviewed  Has one.  Memory: Felt to be good  , no concern from her or her family.  Depression: No anhedonia unusual crying or depressive symptoms  Nutrition: Eats well balanced diet; adequate calcium and vitamin D. No swallowing chewiing problems.  Injury: no major injuries in the last six months.  Other healthcare providers:  Reviewed today .  Social:  Lives with husband married.    Preventive parameters: up-to-date on colonoscopy, mammogram, immunizations. Including Tdap and pneumovax.  ADLS:   There are no problems or need for assistance  driving, feeding, obtaining food, dressing, toileting and bathing, managing money using phone. She is independent.  Exercise  Yoga  Curves PT social etoh less than 1 per day non smoker   ROS:  GEN/ HEENT: No fever, significant weight changes sweats headaches vision problems hearing changes, CV/ PULM; No chest pain shortness of breath cough, syncope,edema  change in exercise tolerance. GI /GU: No adominal pain, vomiting, change in bowel habits. No blood in the stool. No significant GU symptoms. SKIN/HEME: ,no acute skin rashes suspicious lesions or bleeding. No lymphadenopathy, nodules, masses.  NEURO/ PSYCH:  No neurologic signs such as weakness numbness. No depression anxiety. Has hip pain and getting pt.  IMM/ Allergy: No unusual infections.  Allergy .   REST of 12 system review negative except as per HPI  BP 122/70  Pulse 74  Temp 98.6 F (37 C)  (Oral)  Ht 5' 4.5" (1.638 m)  Wt 124 lb (56.246 kg)  BMI 20.96 kg/m2  SpO2 98% Wt Readings from Last 3 Encounters:  07/21/12 124 lb (56.246 kg)  05/12/11 125 lb 9.6 oz (56.972 kg)  04/02/11 126 lb (57.153 kg)     Physical Exam: Vital signs reviewed ZOX:WRUE is a well-developed well-nourished alert cooperative  white female who appears her stated age in no acute distress.  HEENT: normocephalic atraumatic , Eyes: PERRL EOM's full, conjunctiva clear, Nares: paten,t no deformity discharge or tenderness., Ears: no deformity EAC's clear TMs with normal landmarks. Mouth: clear OP, no lesions, edema.  Moist mucous membranes. Dentition in adequate repair. NECK: supple without masses, thyromegaly or bruits. CHEST/PULM:  Clear to auscultation and percussion breath sounds equal no wheeze , rales or rhonchi. No chest wall deformities or tenderness. Breast: normal by inspection . No dimpling, discharge, masses, tenderness or discharge . CV: PMI is nondisplaced, S1 S2 no gallops, murmurs, rubs. Peripheral pulses are full without delay.No JVD .  ABDOMEN: Bowel sounds normal nontender  No guard or rebound, no hepato splenomegal no CVA tenderness.  No hernia. Extremtities:  No clubbing cyanosis or edema, no acute joint swelling or redness no focal atrophy NEURO:  Oriented x3, cranial nerves 3-12 appear to be intact, no obvious focal weakness,gait within normal limits no abnormal reflexes or asymmetrical SKIN: No acute rashes normal turgor, color, no bruising or petechiae. PSYCH: Oriented, good eye contact, no obvious depression anxiety,  cognition and judgment appear normal. LN: no cervical axillary inguinal adenopathy Labs reviewed from Dr Durenda Age ASSESSMENT:  Preventive Health Care Counseled regarding healthy nutrition, exercise, sleep, injury prevention, calcium vit d and healthy weight . FLU vaccine done at Drug store   LIPIDs    Intensify lifestyle interventions. Low WBC, 3.6  elevated MCV  stable 105   Borderline up ace follow  Has had neg b12 and heme eval in remote past.  THyroid   On replacement contact us for refill change pharmacy [facility]  OA  Stable in pt for  Hip integrative therapies  IBS using supplement helping  Labs today   Report when available

## 2012-07-21 NOTE — Patient Instructions (Signed)
Will notify you  of labs when available. Continue lifestyle intervention healthy eating and exercise . PV in a year.

## 2012-07-22 ENCOUNTER — Encounter: Payer: Self-pay | Admitting: Internal Medicine

## 2012-07-22 DIAGNOSIS — R718 Other abnormality of red blood cells: Secondary | ICD-10-CM | POA: Insufficient documentation

## 2012-09-06 ENCOUNTER — Telehealth: Payer: Self-pay | Admitting: Internal Medicine

## 2012-09-06 NOTE — Telephone Encounter (Signed)
Pt needs refill of levothyroxine 112 mcg. Pt has new pharmacy:  [facility] [address] [place]  [phone]

## 2012-09-07 ENCOUNTER — Other Ambulatory Visit: Payer: Self-pay | Admitting: Family Medicine

## 2012-09-07 MED ORDER — LEVOTHYROXINE SODIUM 112 MCG PO TABS: 112.0000 ug | ORAL_TABLET | Freq: Every day | ORAL | Status: DC

## 2012-11-06 ENCOUNTER — Other Ambulatory Visit: Payer: Self-pay

## 2013-01-19 ENCOUNTER — Telehealth: Payer: Self-pay | Admitting: Internal Medicine

## 2013-01-19 NOTE — Telephone Encounter (Signed)
Patient Information:  Caller Name: Erandy  Phone: [phone]  Patient: Angela Burgess  Gender: Female  DOB: April 29, 1941  Age: 72 Years  PCP: Berniece Andreas (Family Practice)  Office Follow Up:  Does the office need to follow up with this patient?: Yes  Instructions For The Office: Patient concerned about ongoing symptoms for 6 months. She attributes this to Thyroid Medicaiton . Please have Dr. Fabian Sharp review and contact patient  RN Note:  Dr. Fabian Sharp please review patient concerns.  Symptoms  Reason For Call & Symptoms: Patient is concerned about her Thyroid Dosage?  she is currently on daily.  She is experiencing (1) Increased Hair loss  (2 changes in bowel habits-constipated (3) Weight gain . Onset 6 months ago.  Last TSH level was drawn 07/21/2012  and was  0.35.   She would like Dr. Fabian Sharp to review and advise  Reviewed Health History In EMR: Yes  Reviewed Medications In EMR: Yes  Reviewed Allergies In EMR: Yes  Reviewed Surgeries / Procedures: Yes  Date of Onset of Symptoms: 06/22/2012  Guideline(s) Used:  No Protocol Available - Information Only  Disposition Per Guideline:   Discuss with PCP and Callback by Nurse Today  Reason For Disposition Reached:   Nursing judgment  Advice Given:  Call Back If:  New symptoms develop  You become worse.  Patient Will Follow Care Advice:  YES

## 2013-01-19 NOTE — Telephone Encounter (Signed)
Please have the patient make an appt with [facility].  She will need to come in and discuss these multiple issues.  Lab work may need to be drawn and may need to do a referral.  Thanks!!

## 2013-01-19 NOTE — Telephone Encounter (Signed)
appt scheduled

## 2013-01-20 ENCOUNTER — Encounter: Payer: Self-pay | Admitting: Internal Medicine

## 2013-01-20 ENCOUNTER — Ambulatory Visit (INDEPENDENT_AMBULATORY_CARE_PROVIDER_SITE_OTHER): Payer: Medicare Other | Admitting: Internal Medicine

## 2013-01-20 VITALS — BP 144/80 | HR 58 | Temp 98.2°F | Wt 128.0 lb

## 2013-01-20 DIAGNOSIS — R635 Abnormal weight gain: Secondary | ICD-10-CM

## 2013-01-20 DIAGNOSIS — L659 Nonscarring hair loss, unspecified: Secondary | ICD-10-CM

## 2013-01-20 DIAGNOSIS — E039 Hypothyroidism, unspecified: Secondary | ICD-10-CM

## 2013-01-20 LAB — CBC WITH DIFFERENTIAL/PLATELET
Basophils Relative: 1 % (ref 0.0–3.0)
Eosinophils Absolute: 0 10*3/uL (ref 0.0–0.7)
HCT: 36.7 % (ref 36.0–46.0)
Hemoglobin: 12.7 g/dL (ref 12.0–15.0)
MCHC: 34.7 g/dL (ref 30.0–36.0)
MCV: 105.6 fl — ABNORMAL HIGH (ref 78.0–100.0)
Monocytes Absolute: 0.3 10*3/uL (ref 0.1–1.0)
Neutro Abs: 2.1 10*3/uL (ref 1.4–7.7)
RBC: 3.48 Mil/uL — ABNORMAL LOW (ref 3.87–5.11)

## 2013-01-20 NOTE — Patient Instructions (Addendum)
Hair  shedding is common .  Could be from thyroid change  But often   Cant find a cause except age.  Dermatologist sometimes advise biotin but uncertain if this helps. Will notify of labs when available.   Fu if     Persistent progressive

## 2013-01-20 NOTE — Progress Notes (Signed)
Chief Complaint  Patient presents with  . Follow-up    oncern about hair loss and weight gain    HPI: Patient comes in today  Sent in by CAN for SDA f problem evaluation.      increasing  hair  Over the last 6 months  gaining weight some  .   ? If thyroid off.   Thyroid pill was  [facility]   And different  Pill and some better. With hair  This occurred   In January.   Missed some doses  2 days last week.  And not this am.  But faithful in taking med otherwise  No other illness new meds no rash on scalp   No fevers cv pulm sx  Other skin changes  ROS: See pertinent positives and negatives per HPI. No fevers    Colors own hair about  2 ,months   .     Past Medical History  Diagnosis Date  . Hypothyroidism   . Macrocytosis     with theme eval in past and normal b12  . Rosacea   . Cervical cancer 1968  . History of mammogram 1/10  . Tetanus-diphtheria (Td) vaccination 1999  . Anemia   . History of normal resting EKG 2005  . Dexamethasone adverse reaction 2008  . Eye exam abnormal 6/09  . Arthritis   . IBS (irritable bowel syndrome)   . Gastritis   . Osteopenia   . Hyperlipidemia   . Rectocele   . Elevated MCV     for years  . Family history of malignant neoplasm of gastrointestinal tract     Family History  Problem Relation Age of Onset  . Cirrhosis Mother   . Kidney disease Mother   . Allergy (severe) Mother     protein allergy that sent posion to her brain  . Leukemia Father   . Heart attack Father 69  . Diabetes Father   . Bipolar disorder Sister   . Stroke Sister   . Lung cancer Brother     smoker  . Seizures Brother   . Bipolar disorder Son   . Colon cancer Brother   . Seizures Sister   . Seizures Sister     History   Social History  . Marital Status: Married    Spouse Name: N/A    Number of Children: 2  . Years of Education: N/A   Occupational History  . retired    Social History Main Topics  . Smoking status: Never  Smoker   . Smokeless tobacco: Never Used  . Alcohol Use: Yes     Comment: 2 drinks a week   . Drug Use: No  . Sexually Active: None   Other Topics Concern  . 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 Astaxanthin 4 MG CAPS Take 1 capsule by mouth 2 (two) times daily.      . Calcium Carbonate-Vitamin D (CALCIUM 500/D) 500-125 MG-UNIT TABS Take by mouth.        . fish oil-omega-3 fatty acids 1000 MG capsule Take 2 g by mouth daily.        . folic acid (FOLVITE) 1 MG tablet Take 1 mg by mouth daily.        [name]  levothyroxine (SYNTHROID, LEVOTHROID) 112 MCG tablet Take 1 tablet (112 mcg total) by mouth daily.  90 tablet  2  . metroNIDAZOLE (METROCREAM) 0.75 % cream Apply 1 application topically daily.        . Multiple Vitamin (MULTIVITAMIN) tablet Take 1 tablet by mouth daily.        . Magnesium Aspartate 65 MG TABS Take by mouth.         No facility-administered encounter medications on file as of 01/20/2013.    EXAM:  BP 144/80  Pulse 58  Temp(Src) 98.2 F (36.8 C) (Oral)  Wt 128 lb (58.06 kg)  BMI 21.64 kg/m2  SpO2 99%  Body mass index is 21.64 kg/(m^2).  GENERAL: vitals reviewed and listed above, alert, oriented, appears well hydrated and in no acute distress  HEENT: atraumatic, conjunctiva  clear, no obvious abnormalities on inspection of external nose and ears OP : no lesion edema or exudate   NECK: no obvious masses on inspection palpation  No adenopathy  LUNGS: clear to auscultation bilaterally, no wheezes, rales or rhonchi, good air movement  CV: HRRR, no clubbing cyanosis or  peripheral edema nl cap refill  Abdomen:  Sof,t normal bowel sounds without hepatosplenomegaly, no guarding rebound or masses no CVA tenderness Skin: normal capillary refill ,turgor , color: No acute rashes ,petechiae or  bruising Scalp  No patchy alopecia and no rash scarring  MS: moves all extremities without noticeable focal  abnormality Neuro  Grossly nl reflexes symmetrical without delay PSYCH: pleasant and cooperative, no obvious depression or anxiety  ASSESSMENT AND PLAN:  Discussed the following assessment and plan:  HYPOTHYROIDISM - Plan: TSH, T4, free, CBC with Differential  Hair loss - seems like shedding  could be age genetics or change in thyr med etc  no other obv disease process at this time - Plan: TSH, T4, free, CBC with Differential  Weight gain - concern   -Patient advised to return or notify health care team  if symptoms worsen or persist or new concerns arise.  Patient Instructions  Hair  shedding is common .  Could be from thyroid change  But often   Cant find a cause except age.  Dermatologist sometimes advise biotin but uncertain if this helps. Will notify of labs when available.   Fu if     Persistent progressive    Neta Mends. Kaci Freel M.D.

## 2013-03-22 LAB — HM MAMMOGRAPHY: HM Mammogram: NORMAL

## 2013-05-16 ENCOUNTER — Other Ambulatory Visit: Payer: Self-pay | Admitting: Internal Medicine

## 2013-06-29 ENCOUNTER — Other Ambulatory Visit (HOSPITAL_BASED_OUTPATIENT_CLINIC_OR_DEPARTMENT_OTHER): Payer: Medicare Other | Admitting: Lab

## 2013-06-29 ENCOUNTER — Ambulatory Visit (HOSPITAL_BASED_OUTPATIENT_CLINIC_OR_DEPARTMENT_OTHER): Payer: Medicare Other

## 2013-06-29 ENCOUNTER — Ambulatory Visit (HOSPITAL_BASED_OUTPATIENT_CLINIC_OR_DEPARTMENT_OTHER): Payer: Medicare Other | Admitting: Hematology & Oncology

## 2013-06-29 DIAGNOSIS — R7989 Other specified abnormal findings of blood chemistry: Secondary | ICD-10-CM

## 2013-06-29 DIAGNOSIS — M129 Arthropathy, unspecified: Secondary | ICD-10-CM

## 2013-06-29 DIAGNOSIS — E039 Hypothyroidism, unspecified: Secondary | ICD-10-CM

## 2013-06-29 LAB — CBC WITH DIFFERENTIAL (CANCER CENTER ONLY)
BASO#: 0 10*3/uL (ref 0.0–0.2)
EOS%: 1.1 % (ref 0.0–7.0)
Eosinophils Absolute: 0.1 10*3/uL (ref 0.0–0.5)
HGB: 12.8 g/dL (ref 11.6–15.9)
LYMPH%: 28.7 % (ref 14.0–48.0)
MCH: 37 pg — ABNORMAL HIGH (ref 26.0–34.0)
MCHC: 35.4 g/dL (ref 32.0–36.0)
MCV: 105 fL — ABNORMAL HIGH (ref 81–101)
MONO%: 7.5 % (ref 0.0–13.0)
RBC: 3.46 10*6/uL — ABNORMAL LOW (ref 3.70–5.32)

## 2013-06-29 NOTE — Progress Notes (Signed)
This office note has been dictated.

## 2013-06-30 LAB — IRON AND TIBC CHCC
%SAT: 88 % — ABNORMAL HIGH (ref 21–57)
Iron: 167 ug/dL — ABNORMAL HIGH (ref 41–142)
TIBC: 189 ug/dL — ABNORMAL LOW (ref 236–444)
UIBC: 22 ug/dL — ABNORMAL LOW (ref 120–384)

## 2013-06-30 LAB — FERRITIN CHCC: Ferritin: 532 ng/ml — ABNORMAL HIGH (ref 9–269)

## 2013-06-30 NOTE — Progress Notes (Signed)
CC:   Pollyann Savoy, M.D.  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  HISTORY OF PRESENT ILLNESS:  Ms. Milliner is a very nice 72 year old white female.  She is originally from [place].  She used to work for [employer].  [place]. She and her husband are retired.  The patient has some issues with arthritis.  She sees Dr. Pollyann Savoy for this.  Dr. Corliss Skains did some x-rays.  There were some changes in some knee x-rays which showed some "crystals."  Based on this, Dr. Corliss Skains did some lab work.  She did electrolytes, which looked okay.  Liver function tests were okay.  TSH was fine.  Ms. Bold does have a history of hypothyroidism.  Iron studies were done.  These showed a ferritin of 616.  Iron studies showed iron saturation of 89%.  Total iron was 185.  A transferrin was 149.  Based on this, Dr. Corliss Skains kindly referred Ms. Sudbury to the [facility] for an evaluation for hemochromatosis.  Ms. Seher says that her mother died of "cirrhosis," but never drank. She never had hepatitis.  Ms. Villamar has, I think, 10 brothers and sisters.  She is not aware of any of them having issues.  Ms. Tibbett does have hypothyroidism.  Of note, Ms. Martian had a hysterectomy back when she was 72 years old.  She has had no change in bowel or bladder habits.  There is no weight loss or weight gain.  She is not a vegetarian.  She has had no rashes. There is no headache.  There are no swallowing difficulties.  Overall, she has a good performance status.  PAST MEDICAL HISTORY:  Remarkable for: 1. Hypothyroidism. 2. Hyperlipidemia. 3. Irritable bowel syndrome. 4. Osteoarthritis. 5. Vertigo. 6. Osteopenia.  ALLERGIES:  Tetracyclines.  MEDICATIONS:  Folic acid 1 mg p.o. q. day, Synthroid 0.112 mg p.o. q. day, metronidazole cream 0.75% daily, multivitamins daily, and fish oil 2 g p.o. q. day.  SOCIAL HISTORY:   Negative for tobacco use.  She has rare alcohol use. She has no obvious occupational exposures.  FAMILY HISTORY:  Again, is remarkable for cirrhosis with her mother. There is history of diabetes in the family.  There is no, I think, history of cancer in the family.  REVIEW OF SYSTEMS:  As stated in the history of present illness.  No additional findings noted on a 12-system review.  PHYSICAL EXAMINATION:  General:  This is a well-developed, well- nourished white female in no obvious distress.  Vital signs: Temperature of 97.9, pulse 71, respiratory rate 18, blood pressure 153/71.  Weight is 127 pounds.  Head and neck:  Normocephalic, atraumatic skull.  There are no ocular or oral lesions.  There are no palpable cervical or supraclavicular lymph nodes.  Lungs:  Clear to percussion and auscultation bilaterally.  Cardiac:  Regular rate and rhythm with a normal S1 and S2.  There are no murmurs, rubs, or bruits. Abdomen:  Soft.  She has good bowel sounds.  There is no fluid wave. There is no palpable abdominal mass.  There is no palpable hepatosplenomegaly.  Back:  No tenderness over the spine, ribs, or hips. Extremities:  No clubbing, cyanosis, or edema.  She does have some swelling in the 1st metacarpophalangeal joint of each hand.  Skin:  No rashes, ecchymosis, or petechia.  Neurological:  No focal neurological deficits.  LABORATORY STUDIES:  White cell count is 5.5, hemoglobin 12.8, hematocrit 36.2, platelet  count 237,000.  MCV is 105.  Peripheral smear shows a normochromic, normocytic population of red blood cells.  There are no nucleated red blood cells.  There are no teardrop cells.  There are no inclusion bodies.  There are no target cells.  I see no rouleaux formation.  White cells appear normal in morphology and maturation.  There are no immature myeloid or lymphoid forms.  Platelets are adequate in number and size.  IMPRESSION:  Ms. Riles is a very charming 73 year old  white female. She has markedly elevated iron stores.  Her ferritin is a 616.  Her iron saturation is 89%.  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  She comes from [place] and European descent.  As such, she is in the right ethnic group.  Her mother had cirrhosis, which certainly could have been from hemochromatosis.  On physical exam, she has the arthritic 1st metacarpophalangeal joints. This is very well known to be involved by hemochromatosis.  We sent off the DNA test.  I do not see that we have to start doing MRIs or other studies on her right now.  I find it very interesting that she had her hysterectomy at 72 years old.  As such, she could not "auto-phlebotomize herself" with her monthly cycles, which would have been beneficial for limiting iron accumulation.  We will see what the hemochromatosis studies show.  I will go ahead and plan to get her back once we get these results back.  I suspect that we probably are going to have to get her on a phlebotomy program to get the iron out of her system.  I spent a good hour with her and her husband.  I explained to them the situation.  I explained to them what hemochromatosis was and how we treated it.  I told them that this was something that we cannot get rid of, but we could treat to prevent complications from iron accumulation.  I suspect that we probably will be getting her back in another 6 weeks or so, depending on how things go with her genetic assay and potential phlebotomy sessions.    ______________________________ Josph Macho, M.D. PRE/MEDQ  D:  06/29/2013  T:  06/30/2013  Job:  [id number]

## 2013-07-01 LAB — COMPREHENSIVE METABOLIC PANEL
AST: 17 U/L (ref 0–37)
Albumin: 4.2 g/dL (ref 3.5–5.2)
Alkaline Phosphatase: 63 U/L (ref 39–117)
BUN: 19 mg/dL (ref 6–23)
CO2: 32 mEq/L (ref 19–32)
Creatinine, Ser: 0.71 mg/dL (ref 0.50–1.10)
Glucose, Bld: 111 mg/dL — ABNORMAL HIGH (ref 70–99)
Potassium: 4.4 mEq/L (ref 3.5–5.3)
Total Bilirubin: 0.3 mg/dL (ref 0.3–1.2)

## 2013-07-05 ENCOUNTER — Other Ambulatory Visit: Payer: Self-pay | Admitting: *Deleted

## 2013-07-05 NOTE — Progress Notes (Signed)
Pt to have weekly phlebotomies x 4 starting this week. To check CBC week 2 and again during f/u visit with Dr Myna Hidalgo. Instructions sent to scheduling to make appts.

## 2013-07-06 ENCOUNTER — Ambulatory Visit (HOSPITAL_BASED_OUTPATIENT_CLINIC_OR_DEPARTMENT_OTHER): Payer: Medicare Other

## 2013-07-06 VITALS — BP 120/56 | HR 60 | Temp 96.1°F | Resp 18

## 2013-07-06 DIAGNOSIS — R42 Dizziness and giddiness: Secondary | ICD-10-CM

## 2013-07-06 DIAGNOSIS — D7589 Other specified diseases of blood and blood-forming organs: Secondary | ICD-10-CM

## 2013-07-06 MED ORDER — SODIUM CHLORIDE 0.9 % IV SOLN
1000.0000 mL | Freq: Once | INTRAVENOUS | Status: AC
  Administered 2013-07-06: 500 mL via INTRAVENOUS

## 2013-07-06 NOTE — Progress Notes (Signed)
Angela Burgess presents today for phlebotomy per MD orders. Phlebotomy procedure started at 1030 and ended at 1040. 500 ml removed. Patient observed for 30 minutes after procedure without any incident. Patient tolerated procedure well. IV needle removed intact.

## 2013-07-06 NOTE — Patient Instructions (Signed)

## 2013-07-06 NOTE — Progress Notes (Signed)
1050  Patient complained of dizzinesss.  Unable to take blood pressure with electronic cuff.  Took BP manually 80/35.  Patient reclined fully.  Iv started of .9 Ns at 500 cc. Patient alert and oriented. Angela Burgess

## 2013-07-12 ENCOUNTER — Other Ambulatory Visit: Payer: Medicare Other | Admitting: Lab

## 2013-07-12 ENCOUNTER — Other Ambulatory Visit (HOSPITAL_BASED_OUTPATIENT_CLINIC_OR_DEPARTMENT_OTHER): Payer: Medicare Other | Admitting: Lab

## 2013-07-12 LAB — CBC WITH DIFFERENTIAL (CANCER CENTER ONLY)
BASO#: 0 10*3/uL (ref 0.0–0.2)
Eosinophils Absolute: 0.1 10*3/uL (ref 0.0–0.5)
HCT: 31.6 % — ABNORMAL LOW (ref 34.8–46.6)
HGB: 10.8 g/dL — ABNORMAL LOW (ref 11.6–15.9)
LYMPH%: 27.1 % (ref 14.0–48.0)
MCH: 36.2 pg — ABNORMAL HIGH (ref 26.0–34.0)
MCHC: 34.2 g/dL (ref 32.0–36.0)
MCV: 106 fL — ABNORMAL HIGH (ref 81–101)
MONO%: 8.2 % (ref 0.0–13.0)
NEUT#: 3.1 10*3/uL (ref 1.5–6.5)
RBC: 2.98 10*6/uL — ABNORMAL LOW (ref 3.70–5.32)

## 2013-07-19 ENCOUNTER — Ambulatory Visit (HOSPITAL_BASED_OUTPATIENT_CLINIC_OR_DEPARTMENT_OTHER): Payer: Medicare Other

## 2013-07-19 VITALS — BP 108/76 | HR 82 | Temp 98.0°F

## 2013-07-19 DIAGNOSIS — D7589 Other specified diseases of blood and blood-forming organs: Secondary | ICD-10-CM

## 2013-07-19 MED ORDER — SODIUM CHLORIDE 0.9 % IV SOLN
Freq: Once | INTRAVENOUS | Status: AC
  Administered 2013-07-19: 12:00:00 via INTRAVENOUS

## 2013-07-19 NOTE — Progress Notes (Signed)
Angela Burgess presents today for phlebotomy per MD orders. Phlebotomy procedure started at 1145 and ended at 1155. 500 grams removed. Patient observed for 30 minutes after procedure without any incident. Patient tolerated procedure well. IV needle removed intact. Since patient had bad experience with phlebotomy last visit, gave patient 500 cc .9 Ns over 1 hour after.  Patient tol well

## 2013-07-19 NOTE — Patient Instructions (Signed)
Therapeutic Phlebotomy Therapeutic phlebotomy is the controlled removal of blood from your body for the purpose of treating a medical condition. It is similar to donating blood. Usually, about a pint (470 mL) of blood is removed. The average adult has 9 to 12 pints (4.3 to 5.7 L) of blood. Therapeutic phlebotomy may be used to treat the following medical conditions:  Hemochromatosis. This is a condition in which there is too much iron in the blood.  Polycythemia vera. This is a condition in which there are too many red cells in the blood.  Porphyria cutanea tarda. This is a disease usually passed from one generation to the next (inherited). It is a condition in which an important part of hemoglobin is not made properly. This results in the build up of abnormal amounts of porphyrins in the body.  Sickle cell disease. This is an inherited disease. It is a condition in which the red blood cells form an abnormal crescent shape rather than a round shape. LET YOUR CAREGIVER KNOW ABOUT:  Allergies.  Medicines taken including herbs, eyedrops, over-the-counter medicines, and creams.  Use of steroids (by mouth or creams).  Previous problems with anesthetics or numbing medicine.  History of blood clots.  History of bleeding or blood problems.  Previous surgery.  Possibility of pregnancy, if this applies. RISKS AND COMPLICATIONS This is a simple and safe procedure. Problems are unlikely. However, problems can occur and may include:  Nausea or lightheadedness.  Low blood pressure.  Soreness, bleeding, swelling, or bruising at the needle insertion site.  Infection. BEFORE THE PROCEDURE  This is a procedure that can be done as an outpatient. Confirm the time that you need to arrive for your procedure. Confirm whether there is a need to fast or withhold any medications. It is helpful to wear clothing with sleeves that can be raised above the elbow. A blood sample may be done to determine the  amount of red blood cells or iron in your blood. Plan ahead of time to have someone drive you home after the procedure. PROCEDURE The entire procedure from preparation through recovery takes about 1 hour. The actual collection takes about 10 to 15 minutes.  A needle will be inserted into your vein.  Tubing and a collection bag will be attached to that needle.  Blood will flow through the needle and tubing into the collection bag.  You may be asked to open and close your hand slowly and continuously during the entire collection.  Once the specified amount of blood has been removed from your body, the collection bag and tubing will be clamped.  The needle will be removed.  Pressure will be held on the site of the needle insertion to stop the bleeding. Then a bandage will be placed over the needle insertion site. AFTER THE PROCEDURE  Your recovery will be assessed and monitored. If there are no problems, as an outpatient, you should be able to go home shortly after the procedure.  Document Released: 02/10/2011 Document Revised: 12/01/2011 Document Reviewed: 02/10/2011 [employer] Patient Information 2014 [employer].  

## 2013-07-26 ENCOUNTER — Other Ambulatory Visit (HOSPITAL_BASED_OUTPATIENT_CLINIC_OR_DEPARTMENT_OTHER): Payer: Medicare Other | Admitting: Lab

## 2013-07-26 ENCOUNTER — Other Ambulatory Visit: Payer: Self-pay | Admitting: *Deleted

## 2013-07-26 ENCOUNTER — Ambulatory Visit (HOSPITAL_BASED_OUTPATIENT_CLINIC_OR_DEPARTMENT_OTHER): Payer: Medicare Other

## 2013-07-26 VITALS — BP 152/63 | HR 73 | Temp 97.1°F | Resp 18

## 2013-07-26 DIAGNOSIS — D7589 Other specified diseases of blood and blood-forming organs: Secondary | ICD-10-CM

## 2013-07-26 LAB — CBC WITH DIFFERENTIAL (CANCER CENTER ONLY)
Eosinophils Absolute: 0.1 10*3/uL (ref 0.0–0.5)
LYMPH#: 1.1 10*3/uL (ref 0.9–3.3)
LYMPH%: 24.9 % (ref 14.0–48.0)
MCH: 36.6 pg — ABNORMAL HIGH (ref 26.0–34.0)
MCV: 107 fL — ABNORMAL HIGH (ref 81–101)
MONO#: 0.4 10*3/uL (ref 0.1–0.9)
MONO%: 8.1 % (ref 0.0–13.0)
NEUT%: 64.7 % (ref 39.6–80.0)
Platelets: 244 10*3/uL (ref 145–400)
RBC: 3.06 10*6/uL — ABNORMAL LOW (ref 3.70–5.32)
WBC: 4.5 10*3/uL (ref 3.9–10.0)

## 2013-07-26 LAB — IRON AND TIBC CHCC
Iron: 147 ug/dL — ABNORMAL HIGH (ref 41–142)
UIBC: 49 ug/dL — ABNORMAL LOW (ref 120–384)

## 2013-07-26 LAB — FERRITIN CHCC: Ferritin: 380 ng/ml — ABNORMAL HIGH (ref 9–269)

## 2013-07-26 MED ORDER — SODIUM CHLORIDE 0.9 % IV SOLN
Freq: Once | INTRAVENOUS | Status: AC
  Administered 2013-07-26: 11:00:00 via INTRAVENOUS

## 2013-07-26 NOTE — Progress Notes (Signed)
Angela Burgess presents today for phlebotomy per MD orders. Phlebotomy procedure started at 1100 and ended at 1115. 500 ml . Patient observed for 30 minutes after procedure without any incident. Patient tolerated procedure well. IV needle removed intact.

## 2013-07-26 NOTE — Patient Instructions (Signed)

## 2013-07-28 ENCOUNTER — Other Ambulatory Visit: Payer: Self-pay

## 2013-08-01 ENCOUNTER — Other Ambulatory Visit: Payer: Self-pay | Admitting: *Deleted

## 2013-08-02 ENCOUNTER — Other Ambulatory Visit (HOSPITAL_BASED_OUTPATIENT_CLINIC_OR_DEPARTMENT_OTHER): Payer: Medicare Other | Admitting: Lab

## 2013-08-02 ENCOUNTER — Ambulatory Visit (HOSPITAL_BASED_OUTPATIENT_CLINIC_OR_DEPARTMENT_OTHER): Payer: Medicare Other

## 2013-08-02 VITALS — BP 140/66 | HR 72 | Temp 97.4°F | Resp 16

## 2013-08-02 DIAGNOSIS — R718 Other abnormality of red blood cells: Secondary | ICD-10-CM

## 2013-08-02 LAB — CBC WITH DIFFERENTIAL (CANCER CENTER ONLY)
BASO#: 0 10*3/uL (ref 0.0–0.2)
HGB: 10.6 g/dL — ABNORMAL LOW (ref 11.6–15.9)
LYMPH%: 20 % (ref 14.0–48.0)
MCH: 37.1 pg — ABNORMAL HIGH (ref 26.0–34.0)
MONO%: 8.3 % (ref 0.0–13.0)
NEUT#: 3.4 10*3/uL (ref 1.5–6.5)
NEUT%: 68.7 % (ref 39.6–80.0)
Platelets: 242 10*3/uL (ref 145–400)
RBC: 2.86 10*6/uL — ABNORMAL LOW (ref 3.70–5.32)
RDW: 12.3 % (ref 11.1–15.7)

## 2013-08-02 NOTE — Progress Notes (Signed)
Rondel Baton presents today for phlebotomy per MD orders. Phlebotomy procedure started at 0915 and ended at 0925. 500 grams removed. Patient observed for 30 minutes after procedure without any incident. Patient tolerated procedure well. IV needle removed intact.

## 2013-08-02 NOTE — Patient Instructions (Signed)
Therapeutic Phlebotomy Therapeutic phlebotomy is the controlled removal of blood from your body for the purpose of treating a medical condition. It is similar to donating blood. Usually, about a pint (470 mL) of blood is removed. The average adult has 9 to 12 pints (4.3 to 5.7 L) of blood. Therapeutic phlebotomy may be used to treat the following medical conditions:  Hemochromatosis. This is a condition in which there is too much iron in the blood.  Polycythemia vera. This is a condition in which there are too many red cells in the blood.  Porphyria cutanea tarda. This is a disease usually passed from one generation to the next (inherited). It is a condition in which an important part of hemoglobin is not made properly. This results in the build up of abnormal amounts of porphyrins in the body.  Sickle cell disease. This is an inherited disease. It is a condition in which the red blood cells form an abnormal crescent shape rather than a round shape. LET YOUR CAREGIVER KNOW ABOUT:  Allergies.  Medicines taken including herbs, eyedrops, over-the-counter medicines, and creams.  Use of steroids (by mouth or creams).  Previous problems with anesthetics or numbing medicine.  History of blood clots.  History of bleeding or blood problems.  Previous surgery.  Possibility of pregnancy, if this applies. RISKS AND COMPLICATIONS This is a simple and safe procedure. Problems are unlikely. However, problems can occur and may include:  Nausea or lightheadedness.  Low blood pressure.  Soreness, bleeding, swelling, or bruising at the needle insertion site.  Infection. BEFORE THE PROCEDURE  This is a procedure that can be done as an outpatient. Confirm the time that you need to arrive for your procedure. Confirm whether there is a need to fast or withhold any medications. It is helpful to wear clothing with sleeves that can be raised above the elbow. A blood sample may be done to determine the  amount of red blood cells or iron in your blood. Plan ahead of time to have someone drive you home after the procedure. PROCEDURE The entire procedure from preparation through recovery takes about 1 hour. The actual collection takes about 10 to 15 minutes.  A needle will be inserted into your vein.  Tubing and a collection bag will be attached to that needle.  Blood will flow through the needle and tubing into the collection bag.  You may be asked to open and close your hand slowly and continuously during the entire collection.  Once the specified amount of blood has been removed from your body, the collection bag and tubing will be clamped.  The needle will be removed.  Pressure will be held on the site of the needle insertion to stop the bleeding. Then a bandage will be placed over the needle insertion site. AFTER THE PROCEDURE  Your recovery will be assessed and monitored. If there are no problems, as an outpatient, you should be able to go home shortly after the procedure.  Document Released: 02/10/2011 Document Revised: 12/01/2011 Document Reviewed: 02/10/2011 [employer] Patient Information 2014 [employer].  

## 2013-08-08 ENCOUNTER — Ambulatory Visit (HOSPITAL_BASED_OUTPATIENT_CLINIC_OR_DEPARTMENT_OTHER): Payer: Medicare Other | Admitting: Hematology & Oncology

## 2013-08-08 NOTE — Progress Notes (Signed)
This office note has been dictated.

## 2013-08-09 ENCOUNTER — Other Ambulatory Visit (HOSPITAL_BASED_OUTPATIENT_CLINIC_OR_DEPARTMENT_OTHER): Payer: Medicare Other | Admitting: Lab

## 2013-08-09 ENCOUNTER — Ambulatory Visit: Payer: Medicare Other | Admitting: Hematology & Oncology

## 2013-08-09 LAB — CBC WITH DIFFERENTIAL (CANCER CENTER ONLY)
BASO#: 0 10*3/uL (ref 0.0–0.2)
BASO%: 0.9 % (ref 0.0–2.0)
EOS%: 2.6 % (ref 0.0–7.0)
Eosinophils Absolute: 0.1 10*3/uL (ref 0.0–0.5)
HCT: 31.4 % — ABNORMAL LOW (ref 34.8–46.6)
HGB: 10.5 g/dL — ABNORMAL LOW (ref 11.6–15.9)
LYMPH#: 1 10*3/uL (ref 0.9–3.3)
LYMPH%: 24 % (ref 14.0–48.0)
MCH: 37.2 pg — ABNORMAL HIGH (ref 26.0–34.0)
MCHC: 33.4 g/dL (ref 32.0–36.0)
MCV: 111 fL — ABNORMAL HIGH (ref 81–101)
MONO#: 0.4 10*3/uL (ref 0.1–0.9)
MONO%: 8.5 % (ref 0.0–13.0)
NEUT#: 2.7 10*3/uL (ref 1.5–6.5)
NEUT%: 64 % (ref 39.6–80.0)
Platelets: 278 10*3/uL (ref 145–400)
RBC: 2.82 10*6/uL — ABNORMAL LOW (ref 3.70–5.32)
RDW: 12.5 % (ref 11.1–15.7)
WBC: 4.3 10*3/uL (ref 3.9–10.0)

## 2013-08-09 LAB — IRON AND TIBC CHCC: UIBC: 30 ug/dL — ABNORMAL LOW (ref 120–384)

## 2013-08-09 LAB — FERRITIN CHCC: Ferritin: 311 ng/mL — ABNORMAL HIGH (ref 9–269)

## 2013-08-09 NOTE — Addendum Note (Signed)
Addended by: Arlan Organ R on: 08/09/2013 08:30 PM   Modules accepted: Orders

## 2013-08-10 ENCOUNTER — Telehealth: Payer: Self-pay | Admitting: Nurse Practitioner

## 2013-08-10 ENCOUNTER — Telehealth: Payer: Self-pay | Admitting: Hematology & Oncology

## 2013-08-10 NOTE — Telephone Encounter (Addendum)
Message copied by Glee Arvin on Wed Aug 10, 2013 12:03 PM ------      Message from: Arlan Organ R      Created: Tue Aug 09, 2013  8:25 PM       Call - iron is still too high - 311.  Need 3 more phlebotomies.  I will set up!! Pete ------Pt verbalized understanding and will be expecting a call from [facility] to get those set-up.

## 2013-08-10 NOTE — Telephone Encounter (Signed)
Pt aware of 11-24,12-1 and 12-28 phlebotomies and 12-29 MD appointments.

## 2013-08-13 NOTE — Progress Notes (Signed)
CC:   Neta Mends. 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  CURRENT THERAPY:  Phlebotomy to get her ferritin less than 100.  INTERIM HISTORY:  Ms. Kurkowski comes in for followup.  We have diagnosed hemochromatosis on her.  She does have a homozygous mutation of the C282Y gene.  She has had I think 3 phlebotomies.  Her initial ferritin was 532.  When we checked a couple weeks ago, her ferritin was down to 380.  Iron saturation was 75%.  She has had a little bit of problems with the phlebotomy.  She has a little bit of vasovagal reaction.  Otherwise, she has been doing okay.  Her birthday was last week.  She had a nice birthday.  She has had no fever, sweats, or chills.  She has had no abdominal pain. There has been no change in bowel or bladder habits.  PHYSICAL EXAMINATION:  General:  This is a well-developed, well- nourished white female, in no obvious distress.  Vital Signs: Temperature of 97.6, pulse 70, respiratory rate 14, blood pressure 124/42, weight is 128 pounds.  Head and Neck:  Normocephalic, atraumatic skull.  There are no ocular or oral lesions.  There are no palpable cervical or supraclavicular lymph nodes.  Lungs:  Clear bilaterally. Cardiac:  Regular rate and rhythm with a normal S1 and S2.  There are no murmurs, rubs, or bruits.  Abdomen:  Soft.  She has good bowel sounds. There is no fluid wave.  There is no palpable hepatosplenomegaly. Extremities:  No clubbing, cyanosis, or edema.  There may be some slight osteoarthritic changes in her joints.  She has good strength in her extremities.  Neurological:  No focal neurological deficits.  Skin:  No rashes, ecchymosis, or petechia.  LABORATORY STUDIES:  White cell count 5, hemoglobin 10.6, hematocrit 31, platelet count 242.  MCV is 108.  IMPRESSION:  Ms. Mungin is a very nice 72 year old white female.  She has hemochromatosis.  This is in her family.  She has a major  mutation for the major gene.  We will see what her iron studies look like.  Again, I want to get her ferritin below 100.  I realized that she does have difficulties with phlebotomies.  As such, we can be a little bit "flexible" with how we phlebotomize her.  I am glad that her iron saturation is coming down.  I think this all points to [place] getting her iron stores released.  I will figure out when to get her back and how many phlebotomies to do depending on her iron studies today.    ______________________________ Josph Macho, M.D. PRE/MEDQ  D:  08/08/2013  T:  08/13/2013  Job:  [id number]

## 2013-08-15 ENCOUNTER — Ambulatory Visit (HOSPITAL_BASED_OUTPATIENT_CLINIC_OR_DEPARTMENT_OTHER): Payer: Medicare Other

## 2013-08-15 ENCOUNTER — Other Ambulatory Visit (HOSPITAL_BASED_OUTPATIENT_CLINIC_OR_DEPARTMENT_OTHER): Payer: Medicare Other | Admitting: Lab

## 2013-08-15 VITALS — BP 132/62 | HR 71 | Temp 97.1°F | Resp 16

## 2013-08-15 DIAGNOSIS — D7589 Other specified diseases of blood and blood-forming organs: Secondary | ICD-10-CM

## 2013-08-15 LAB — CBC WITH DIFFERENTIAL (CANCER CENTER ONLY)
Eosinophils Absolute: 0.1 10*3/uL (ref 0.0–0.5)
HCT: 32.3 % — ABNORMAL LOW (ref 34.8–46.6)
HGB: 10.9 g/dL — ABNORMAL LOW (ref 11.6–15.9)
LYMPH%: 22.3 % (ref 14.0–48.0)
MCH: 36.8 pg — ABNORMAL HIGH (ref 26.0–34.0)
MCV: 109 fL — ABNORMAL HIGH (ref 81–101)
MONO%: 7.9 % (ref 0.0–13.0)
NEUT%: 66.9 % (ref 39.6–80.0)
Platelets: 264 10*3/uL (ref 145–400)
RBC: 2.96 10*6/uL — ABNORMAL LOW (ref 3.70–5.32)
RDW: 11.9 % (ref 11.1–15.7)
WBC: 4 10*3/uL (ref 3.9–10.0)

## 2013-08-15 LAB — IRON AND TIBC CHCC: Iron: 149 ug/dL — ABNORMAL HIGH (ref 41–142)

## 2013-08-15 LAB — FERRITIN CHCC: Ferritin: 301 ng/ml — ABNORMAL HIGH (ref 9–269)

## 2013-08-15 MED ORDER — SODIUM CHLORIDE 0.9 % IV SOLN
Freq: Once | INTRAVENOUS | Status: AC
  Administered 2013-08-15: 12:00:00 via INTRAVENOUS

## 2013-08-15 NOTE — Patient Instructions (Signed)
Therapeutic Phlebotomy Therapeutic phlebotomy is the controlled removal of blood from your body for the purpose of treating a medical condition. It is similar to donating blood. Usually, about a pint (470 mL) of blood is removed. The average adult has 9 to 12 pints (4.3 to 5.7 L) of blood. Therapeutic phlebotomy may be used to treat the following medical conditions:  Hemochromatosis. This is a condition in which there is too much iron in the blood.  Polycythemia vera. This is a condition in which there are too many red cells in the blood.  Porphyria cutanea tarda. This is a disease usually passed from one generation to the next (inherited). It is a condition in which an important part of hemoglobin is not made properly. This results in the build up of abnormal amounts of porphyrins in the body.  Sickle cell disease. This is an inherited disease. It is a condition in which the red blood cells form an abnormal crescent shape rather than a round shape. LET YOUR CAREGIVER KNOW ABOUT:  Allergies.  Medicines taken including herbs, eyedrops, over-the-counter medicines, and creams.  Use of steroids (by mouth or creams).  Previous problems with anesthetics or numbing medicine.  History of blood clots.  History of bleeding or blood problems.  Previous surgery.  Possibility of pregnancy, if this applies. RISKS AND COMPLICATIONS This is a simple and safe procedure. Problems are unlikely. However, problems can occur and may include:  Nausea or lightheadedness.  Low blood pressure.  Soreness, bleeding, swelling, or bruising at the needle insertion site.  Infection. BEFORE THE PROCEDURE  This is a procedure that can be done as an outpatient. Confirm the time that you need to arrive for your procedure. Confirm whether there is a need to fast or withhold any medications. It is helpful to wear clothing with sleeves that can be raised above the elbow. A blood sample may be done to determine the  amount of red blood cells or iron in your blood. Plan ahead of time to have someone drive you home after the procedure. PROCEDURE The entire procedure from preparation through recovery takes about 1 hour. The actual collection takes about 10 to 15 minutes.  A needle will be inserted into your vein.  Tubing and a collection bag will be attached to that needle.  Blood will flow through the needle and tubing into the collection bag.  You may be asked to open and close your hand slowly and continuously during the entire collection.  Once the specified amount of blood has been removed from your body, the collection bag and tubing will be clamped.  The needle will be removed.  Pressure will be held on the site of the needle insertion to stop the bleeding. Then a bandage will be placed over the needle insertion site. AFTER THE PROCEDURE  Your recovery will be assessed and monitored. If there are no problems, as an outpatient, you should be able to go home shortly after the procedure.  Document Released: 02/10/2011 Document Revised: 12/01/2011 Document Reviewed: 02/10/2011 [employer] Patient Information 2014 [employer].  

## 2013-08-16 ENCOUNTER — Ambulatory Visit: Payer: Medicare Other | Admitting: Hematology & Oncology

## 2013-08-22 ENCOUNTER — Other Ambulatory Visit: Payer: Self-pay | Admitting: Nurse Practitioner

## 2013-08-22 ENCOUNTER — Other Ambulatory Visit (HOSPITAL_BASED_OUTPATIENT_CLINIC_OR_DEPARTMENT_OTHER): Payer: Medicare Other | Admitting: Lab

## 2013-08-22 ENCOUNTER — Ambulatory Visit (HOSPITAL_BASED_OUTPATIENT_CLINIC_OR_DEPARTMENT_OTHER): Payer: Medicare Other

## 2013-08-22 VITALS — BP 137/53 | HR 67 | Temp 97.0°F | Resp 18

## 2013-08-22 DIAGNOSIS — D7589 Other specified diseases of blood and blood-forming organs: Secondary | ICD-10-CM

## 2013-08-22 DIAGNOSIS — R718 Other abnormality of red blood cells: Secondary | ICD-10-CM

## 2013-08-22 LAB — CBC WITH DIFFERENTIAL (CANCER CENTER ONLY)
BASO#: 0 10*3/uL (ref 0.0–0.2)
Eosinophils Absolute: 0.1 10*3/uL (ref 0.0–0.5)
HCT: 29.9 % — ABNORMAL LOW (ref 34.8–46.6)
HGB: 10.1 g/dL — ABNORMAL LOW (ref 11.6–15.9)
LYMPH#: 1 10*3/uL (ref 0.9–3.3)
LYMPH%: 23.5 % (ref 14.0–48.0)
MCHC: 33.8 g/dL (ref 32.0–36.0)
MCV: 110 fL — ABNORMAL HIGH (ref 81–101)
MONO#: 0.4 10*3/uL (ref 0.1–0.9)
NEUT#: 2.6 10*3/uL (ref 1.5–6.5)
NEUT%: 64.8 % (ref 39.6–80.0)
RBC: 2.71 10*6/uL — ABNORMAL LOW (ref 3.70–5.32)

## 2013-08-22 MED ORDER — SODIUM CHLORIDE 0.45 % IV SOLN
Freq: Once | INTRAVENOUS | Status: DC
  Filled 2013-08-22: qty 1000

## 2013-08-22 MED ORDER — SODIUM CHLORIDE 0.9 % IV SOLN
Freq: Once | INTRAVENOUS | Status: AC
  Administered 2013-08-22: 12:00:00 via INTRAVENOUS

## 2013-08-22 NOTE — Patient Instructions (Signed)

## 2013-08-29 ENCOUNTER — Other Ambulatory Visit: Payer: Self-pay | Admitting: *Deleted

## 2013-08-29 ENCOUNTER — Other Ambulatory Visit (HOSPITAL_BASED_OUTPATIENT_CLINIC_OR_DEPARTMENT_OTHER): Payer: Medicare Other | Admitting: Lab

## 2013-08-29 ENCOUNTER — Ambulatory Visit (HOSPITAL_BASED_OUTPATIENT_CLINIC_OR_DEPARTMENT_OTHER): Payer: Medicare Other

## 2013-08-29 VITALS — BP 123/56 | HR 75 | Temp 97.7°F | Resp 16

## 2013-08-29 DIAGNOSIS — D7589 Other specified diseases of blood and blood-forming organs: Secondary | ICD-10-CM

## 2013-08-29 DIAGNOSIS — I959 Hypotension, unspecified: Secondary | ICD-10-CM

## 2013-08-29 DIAGNOSIS — R718 Other abnormality of red blood cells: Secondary | ICD-10-CM

## 2013-08-29 DIAGNOSIS — R42 Dizziness and giddiness: Secondary | ICD-10-CM

## 2013-08-29 LAB — CBC WITH DIFFERENTIAL (CANCER CENTER ONLY)
BASO#: 0 10*3/uL (ref 0.0–0.2)
BASO%: 0.6 % (ref 0.0–2.0)
EOS%: 1.7 % (ref 0.0–7.0)
HGB: 10.6 g/dL — ABNORMAL LOW (ref 11.6–15.9)
LYMPH#: 1.3 10*3/uL (ref 0.9–3.3)
LYMPH%: 27 % (ref 14.0–48.0)
MCH: 37.5 pg — ABNORMAL HIGH (ref 26.0–34.0)
MCHC: 33.9 g/dL (ref 32.0–36.0)
MCV: 111 fL — ABNORMAL HIGH (ref 81–101)
MONO%: 8 % (ref 0.0–13.0)
NEUT#: 3 10*3/uL (ref 1.5–6.5)
Platelets: 290 10*3/uL (ref 145–400)
RDW: 11.9 % (ref 11.1–15.7)

## 2013-08-29 LAB — IRON AND TIBC CHCC: Iron: 163 ug/dL — ABNORMAL HIGH (ref 41–142)

## 2013-08-29 MED ORDER — SODIUM CHLORIDE 0.9 % IV SOLN
Freq: Once | INTRAVENOUS | Status: AC
  Administered 2013-08-29: 14:00:00 via INTRAVENOUS

## 2013-08-29 NOTE — Progress Notes (Signed)
Angela Burgess presents today for phlebotomy per MD orders. Phlebotomy procedure started at 1428 and ended at 1445. 500 mls removed. Patient observed for 30 minutes after procedure without any incident. Patient tolerated procedure well. IV needle removed intact.  At the end of the phlebotomy, pt c/o being lightheaded.  Blood pressure down.  Head down.  Skin warm and dry.  IV fluids started. Pt also given orange juice and this helped.  Blood pressure returned to normal after 10 minutes.  Pt feels much better.

## 2013-08-30 ENCOUNTER — Telehealth: Payer: Self-pay | Admitting: Nurse Practitioner

## 2013-08-30 NOTE — Telephone Encounter (Signed)
Message copied by Glee Arvin on Tue Aug 30, 2013  4:38 PM ------      Message from: Arlan Organ R      Created: Mon Aug 29, 2013  9:52 PM       Call - iron is still coming down.  Ferritin is now 244. Need to keep going with phlbotomies!  Pete ------

## 2013-08-31 ENCOUNTER — Encounter: Payer: Self-pay | Admitting: Internal Medicine

## 2013-08-31 ENCOUNTER — Ambulatory Visit (INDEPENDENT_AMBULATORY_CARE_PROVIDER_SITE_OTHER): Payer: Medicare Other | Admitting: Internal Medicine

## 2013-08-31 ENCOUNTER — Telehealth: Payer: Self-pay | Admitting: Hematology & Oncology

## 2013-08-31 ENCOUNTER — Telehealth: Payer: Self-pay | Admitting: Nurse Practitioner

## 2013-08-31 VITALS — BP 154/66 | HR 62 | Temp 98.0°F | Ht 64.5 in | Wt 127.0 lb

## 2013-08-31 DIAGNOSIS — Z0000 Encounter for general adult medical examination without abnormal findings: Secondary | ICD-10-CM

## 2013-08-31 DIAGNOSIS — E782 Mixed hyperlipidemia: Secondary | ICD-10-CM

## 2013-08-31 DIAGNOSIS — E039 Hypothyroidism, unspecified: Secondary | ICD-10-CM

## 2013-08-31 NOTE — Telephone Encounter (Signed)
Pt called scheduled 12-16 and 12-23 appointments

## 2013-08-31 NOTE — Patient Instructions (Addendum)
Can ask hematology if they can do a cholesterol  Lipid  Level at one of the blood draws.    Call ahead and get prevnar 13 vaccine  When convenient .  (pneoimococcal vaccine)  Continue lifestyle intervention healthy eating and exercise .  Bone Health Our bones do many things. They provide structure, protect organs, anchor muscles, and store calcium. Adequate calcium in your diet and weight-bearing physical activity help build strong bones, improve bone amounts, and may reduce the risk of weakening of bones (osteoporosis) later in life. PEAK BONE MASS By age 65, the average woman has acquired most of her skeletal bone mass. A large decline occurs in older adults which increases the risk of osteoporosis. In women this occurs around the time of menopause. It is important for young girls to reach their peak bone mass in order to maintain bone health throughout life. A person with high bone mass as a young adult will be more likely to have a higher bone mass later in life. Not enough calcium consumption and physical activity early on could result in a failure to achieve optimum bone mass in adulthood. OSTEOPOROSIS Osteoporosis is a disease of the bones. It is defined as low bone mass with deterioration of bone structure. Osteoporosis leads to an increase risk of fractures with falls. These fractures commonly happen in the wrist, hip, and spine. While men and women of all ages and background can develop osteoporosis, some of the risk factors for osteoporosis are:  Female.  White.  Postmenopausal.  Older adults.  Small in body size.  Eating a diet low in calcium.  Physically inactive.  Smoking.  Use of some medications.  Family history. CALCIUM Calcium is a mineral needed by the body for healthy bones, teeth, and proper function of the heart, muscles, and nerves. The body cannot produce calcium so it must be absorbed through food. Good sources of calcium include:  Dairy products (low fat  or nonfat milk, cheese, and yogurt).  Dark green leafy vegetables (bok choy and broccoli).  Calcium fortified foods (orange juice, cereal, bread, soy beverages, and tofu products).  Nuts (almonds). Recommended amounts of calcium vary for individuals. RECOMMENDED CALCIUM INTAKES Age and Amount in mg per day  Children 1 to 3 years / 700 mg  Children 4 to 8 years / 1,000 mg  Children 9 to 13 years / 1,300 mg  Teens 14 to 18 years / 1,300 mg  Adults 19 to 50 years / 1,000 mg  Adult women 51 to 70 years / 1,200 mg  Adults 71 years and older / 1,200 mg  Pregnant and breastfeeding teens / 1,300 mg  Pregnant and breastfeeding adults / 1,000 mg Vitamin D also plays an important role in healthy bone development. Vitamin D helps in the absorption of calcium. WEIGHT-BEARING PHYSICAL ACTIVITY Regular physical activity has many positive health benefits. Benefits include strong bones. Weight-bearing physical activity early in life is important in reaching peak bone mass. Weight-bearing physical activities cause muscles and bones to work against gravity. Some examples of weight bearing physical activities include:  Walking, jogging, or running.  DIRECTV.  Jumping rope.  Dancing.  Soccer.  Tennis or Racquetball.  Stair climbing.  Basketball.  Hiking.  Weight lifting.  Aerobic fitness classes. Including weight-bearing physical activity into an exercise plan is a great way to keep bones healthy. Adults: Engage in at least 30 minutes of moderate physical activity on most, preferably all, days of the week. Children: Engage in at  least 60 minutes of moderate physical activity on most, preferably all, days of the week. FOR MORE INFORMATION [place] [employer], [employer] for [employer] and Promotion: www.cnpp.usda.gov National Osteoporosis Foundation: [email or web address] Document Released: 11/29/2003 Document Revised: 01/03/2013 Document Reviewed:  02/28/2009 [facility] Patient Information 2014 [place], [place].

## 2013-08-31 NOTE — Progress Notes (Signed)
Chief Complaint  Patient presents with  . Medicare Wellness    HPI: Patient comes in today for Preventive Medicare wellness visit . No major injuries, ed visits ,hospitalizations , new medications since last visit. 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  mpom may have had this undx  Cns arthritis and neuroi changes  elevated iron saturation  Goal being ferritin below  100  Now 224  Getting phlebotomies last one 12 9   Flu vaccine   done dexa scan   When due per gyne  Doing pretty well  No other concerns Had crystals  On her knees  And then had tests  Thyroid was ok . Then  Saw high iron level  Numb right leg at night    Hearing:  Ok   Vision:  No limitations at present . Last eye check UTD  Safety:  Has smoke detector and wears seat belts.  No firearms. No excess sun exposure. Sees dentist regularly.  Falls:  No   Advance directive :  Reviewed    Memory: Felt to be good  , no concern from her or her family.  Depression: No anhedonia unusual crying or depressive symptoms  Nutrition: Eats well balanced diet; adequate calcium and vitamin D. No swallowing chewing problems.  Injury: no major injuries in the last six months.  Other healthcare providers:  Reviewed today .  Social:  Lives with spouse married. No pets.   Preventive parameters: up-to-date  Reviewed   ADLS:   There are no problems or need for assistance  driving, feeding, obtaining food, dressing, toileting and bathing, managing money using phone. She is independent.  EXERCISE/ HABITS  Per week   No tobacco    etoh rare.    ROS:  GEN/ HEENT: No fever, significant weight changes sweats headaches vision problems hearing changes, CV/ PULM; No chest pain shortness of breath cough, syncope,edema  change in exercise tolerance. GI /GU: No adominal pain, vomiting, change in bowel habits. No blood in the stool. No significant GU symptoms. SKIN/HEME: ,no acute skin rashes suspicious  lesions or bleeding. No lymphadenopathy, nodules, masses.  NEURO/ PSYCH:  No neurologic signs such as weakness numbness. No depression anxiety. IMM/ Allergy: No unusual infections.  Allergy .   REST of 12 system review negative except as per HPI   Past Medical History  Diagnosis Date  . Hypothyroidism   . Macrocytosis     with theme eval in past and normal b12  . Rosacea   . Cervical cancer 1968  . History of mammogram 1/10  . Tetanus-diphtheria (Td) vaccination 1999  . Anemia   . History of normal resting EKG 2005  . Dexamethasone adverse reaction 2008  . Eye exam abnormal 6/09  . Arthritis   . IBS (irritable bowel syndrome)   . Gastritis   . Osteopenia   . Hyperlipidemia   . Rectocele   . Elevated MCV     for years  . Family history of malignant neoplasm of gastrointestinal tract     Family History  Problem Relation Age of Onset  . Cirrhosis Mother   . Kidney disease Mother   . Allergy (severe) Mother     protein allergy that sent posion to her brain  . Leukemia Father   . Heart attack Father 65  . Diabetes Father   . Bipolar disorder Sister   . Stroke Sister   . Lung cancer Brother     smoker  . Seizures Brother   .  Bipolar disorder Son   . Colon cancer Brother   . Seizures Sister   . Seizures Sister     History   Social History  . Marital Status: Married    Spouse Name: N/A    Number of Children: 2  . Years of Education: N/A   Occupational History  . retired    Social History Main Topics  . Smoking status: Never Smoker   . Smokeless tobacco: Never Used  . Alcohol Use: Yes     Comment: 2 drinks a week   . Drug Use: No  . Sexual Activity: None   Other Topics Concern  . 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 Astaxanthin 4 MG CAPS Take 1 capsule by mouth 2 (two) times daily.  . Calcium  Carbonate-Vitamin D (CALCIUM 500/D) 500-125 MG-UNIT TABS Take by mouth.    . estradiol (ESTRACE) 0.1 MG/GM vaginal cream Place 1 Applicatorful vaginally at bedtime.  . fish oil-omega-3 fatty acids 1000 MG capsule Take 2 g by mouth daily.    . folic acid (FOLVITE) 1 MG tablet Take 1 mg by mouth daily.    [name] levothyroxine (SYNTHROID, LEVOTHROID) 112 MCG tablet TAKE 1 TABLET (112 MCG TOTAL) BY MOUTH DAILY.  . metroNIDAZOLE (METROCREAM) 0.75 % cream Apply 1 application topically daily.    . Multiple Vitamin (MULTIVITAMIN) tablet Take 1 tablet by mouth daily.      EXAM:  BP 154/66  Pulse 62  Temp(Src) 98 F (36.7 C) (Oral)  Ht 5' 4.5" (1.638 m)  Wt 127 lb (57.607 kg)  BMI 21.47 kg/m2  SpO2 98%  Body mass index is 21.47 kg/(m^2).  Physical Exam: Vital signs reviewed [id number] is a well-developed well-nourished alert cooperative   who appears stated age in no acute distress.  HEENT: normocephalic atraumatic , Eyes: PERRL EOM's full, conjunctiva clear, Nares: paten,t no deformity discharge or tenderness., Ears: no deformity EAC's clear TMs with normal landmarks. Mouth: clear OP, no lesions, edema.  Moist mucous membranes. Dentition in adequate repair. NECK: supple without masses, thyromegaly or bruits. No adenopathy CHEST/PULM:  Clear to auscultation and percussion breath sounds equal no wheeze , rales or rhonchi. No chest wall deformities or tenderness. CV: PMI is nondisplaced, S1 S2 no gallops, murmurs, rubs. Peripheral pulses are full without delay.No JVD .  ABDOMEN: Bowel sounds normal nontender  No guard or rebound, no hepato splenomegal no CVA tenderness.  No hernia. Extremtities:  No clubbing cyanosis or edema, no acute joint swelling or redness no focal atrophy NEURO:  Oriented x3, cranial nerves 3-12 appear to be intact, no obvious focal weakness,gait within normal limits no abnormal reflexes or asymmetrical SKIN: No acute rashes normal turgor, color, no bruising or petechiae. PSYCH:  Oriented, good eye contact, no obvious depression anxiety, cognition and judgment appear normal. LN: no cervical axillary inguinal adenopathy No noted deficits in memory, attention, and speech.   Lab Results  Component Value Date   WBC 4.8 08/29/2013   HGB 10.6* 08/29/2013   HCT 31.3* 08/29/2013   PLT 290 08/29/2013   GLUCOSE 111* 06/29/2013   CHOL 233* 07/21/2012   TRIG 88.0 07/21/2012   HDL 51.40 07/21/2012   LDLDIRECT 165.2 07/21/2012   ALT 13 06/29/2013   AST 17 06/29/2013   NA 138 06/29/2013  K 4.4 06/29/2013   CL 100 06/29/2013   CREATININE 0.71 06/29/2013   BUN 19 06/29/2013   CO2 32 06/29/2013   TSH 1.44 01/20/2013   HGBA1C 5.0 07/21/2012    ASSESSMENT AND PLAN:  Discussed the following assessment and plan:  Visit for preventive health examination  Hemochromatosis - phlebotomy treatment currently  Mixed hyperlipidemia  Encounter for Medicare annual wellness exam  Unspecified hypothyroidism Check bp  othe times  Says 130 and low after phlebotomy  Will monitor fu if needed  Lipid panel   Can be done at any time but unles very high not a medication candidate ,  Patient Care Team: Madelin Headings, MD as PCP - General Hart Carwin, MD (Gastroenterology) Leslie Andrea, MD as Attending Physician (Obstetrics and Gynecology) Susy Frizzle, MD (Rheumatology) inman   savage  Patient Instructions  Can ask hematology if they can do a cholesterol  Lipid  Level at one of the blood draws.    Call ahead and get prevnar 13 vaccine  When convenient .  (pneoimococcal vaccine)  Continue lifestyle intervention healthy eating and exercise .  Bone Health Our bones do many things. They provide structure, protect organs, anchor muscles, and store calcium. Adequate calcium in your diet and weight-bearing physical activity help build strong bones, improve bone amounts, and may reduce the risk of weakening of bones (osteoporosis) later in life. PEAK BONE MASS By age 32, the  average woman has acquired most of her skeletal bone mass. A large decline occurs in older adults which increases the risk of osteoporosis. In women this occurs around the time of menopause. It is important for young girls to reach their peak bone mass in order to maintain bone health throughout life. A person with high bone mass as a young adult will be more likely to have a higher bone mass later in life. Not enough calcium consumption and physical activity early on could result in a failure to achieve optimum bone mass in adulthood. OSTEOPOROSIS Osteoporosis is a disease of the bones. It is defined as low bone mass with deterioration of bone structure. Osteoporosis leads to an increase risk of fractures with falls. These fractures commonly happen in the wrist, hip, and spine. While men and women of all ages and background can develop osteoporosis, some of the risk factors for osteoporosis are:  Female.  White.  Postmenopausal.  Older adults.  Small in body size.  Eating a diet low in calcium.  Physically inactive.  Smoking.  Use of some medications.  Family history. CALCIUM Calcium is a mineral needed by the body for healthy bones, teeth, and proper function of the heart, muscles, and nerves. The body cannot produce calcium so it must be absorbed through food. Good sources of calcium include:  Dairy products (low fat or nonfat milk, cheese, and yogurt).  Dark green leafy vegetables (bok choy and broccoli).  Calcium fortified foods (orange juice, cereal, bread, soy beverages, and tofu products).  Nuts (almonds). Recommended amounts of calcium vary for individuals. RECOMMENDED CALCIUM INTAKES Age and Amount in mg per day  Children 1 to 3 years / 700 mg  Children 4 to 8 years / 1,000 mg  Children 9 to 13 years / 1,300 mg  Teens 14 to 18 years / 1,300 mg  Adults 19 to 50 years / 1,000 mg  Adult women 51 to 70 years / 1,200 mg  Adults 71 years and older / 1,200  mg  Pregnant and  breastfeeding teens / 1,300 mg  Pregnant and breastfeeding adults / 1,000 mg Vitamin D also plays an important role in healthy bone development. Vitamin D helps in the absorption of calcium. WEIGHT-BEARING PHYSICAL ACTIVITY Regular physical activity has many positive health benefits. Benefits include strong bones. Weight-bearing physical activity early in life is important in reaching peak bone mass. Weight-bearing physical activities cause muscles and bones to work against gravity. Some examples of weight bearing physical activities include:  Walking, jogging, or running.  DIRECTV.  Jumping rope.  Dancing.  Soccer.  Tennis or Racquetball.  Stair climbing.  Basketball.  Hiking.  Weight lifting.  Aerobic fitness classes. Including weight-bearing physical activity into an exercise plan is a great way to keep bones healthy. Adults: Engage in at least 30 minutes of moderate physical activity on most, preferably all, days of the week. Children: Engage in at least 60 minutes of moderate physical activity on most, preferably all, days of the week. FOR MORE INFORMATION [place] [employer], [employer] for [employer] and Promotion: www.cnpp.usda.gov National Osteoporosis Foundation: [employer] Document Released: 11/29/2003 Document Revised: 01/03/2013 Document Reviewed: 02/28/2009 [facility] Patient Information 2014 [place], [place].     Neta Mends. Mathis Cashman M.D.    Pre visit review using our clinic review tool, if applicable. No additional management support is needed unless otherwise documented below in the visit note.

## 2013-08-31 NOTE — Telephone Encounter (Addendum)
Message copied by Glee Arvin on Wed Aug 31, 2013 11:26 AM ------      Message from: Arlan Organ R      Created: Mon Aug 29, 2013  9:52 PM       Call - iron is still coming down.  Ferritin is now 244. Need to keep going with phlbotomies!  Pete ------LVM and requested pt call back and talk to [facility] and schedule a phlebotomy x 2.

## 2013-09-06 ENCOUNTER — Ambulatory Visit (HOSPITAL_BASED_OUTPATIENT_CLINIC_OR_DEPARTMENT_OTHER): Payer: Medicare Other

## 2013-09-06 MED ORDER — SODIUM CHLORIDE 0.9 % IV SOLN
INTRAVENOUS | Status: DC
  Administered 2013-09-06: 12:00:00 via INTRAVENOUS

## 2013-09-06 NOTE — Progress Notes (Signed)
Angela Burgess presents today for phlebotomy per MD orders. Phlebotomy procedure started at 1210 and ended at 1222. 500 grams removed. Patient observed for 30 minutes after procedure without any incident. Patient tolerated procedure well. IV needle removed intact.   Pt did not experience any dizziness or lightheadedness. She stated she felt fine however, she was still given 1L IVF preventively due to her history of near syncope and hypotensive episodes.

## 2013-09-06 NOTE — Patient Instructions (Signed)

## 2013-09-13 ENCOUNTER — Ambulatory Visit (HOSPITAL_BASED_OUTPATIENT_CLINIC_OR_DEPARTMENT_OTHER): Payer: Medicare Other

## 2013-09-13 ENCOUNTER — Other Ambulatory Visit: Payer: Medicare Other | Admitting: Lab

## 2013-09-13 ENCOUNTER — Other Ambulatory Visit: Payer: Self-pay | Admitting: *Deleted

## 2013-09-13 ENCOUNTER — Other Ambulatory Visit (HOSPITAL_BASED_OUTPATIENT_CLINIC_OR_DEPARTMENT_OTHER): Payer: Medicare Other | Admitting: Lab

## 2013-09-13 LAB — CBC WITH DIFFERENTIAL (CANCER CENTER ONLY)
BASO#: 0 10*3/uL (ref 0.0–0.2)
BASO%: 0.8 % (ref 0.0–2.0)
EOS%: 1 % (ref 0.0–7.0)
HGB: 10.1 g/dL — ABNORMAL LOW (ref 11.6–15.9)
LYMPH#: 1 10*3/uL (ref 0.9–3.3)
MCHC: 33.4 g/dL (ref 32.0–36.0)
MONO%: 7.5 % (ref 0.0–13.0)
NEUT#: 2.6 10*3/uL (ref 1.5–6.5)
Platelets: 231 10*3/uL (ref 145–400)
RBC: 2.66 10*6/uL — ABNORMAL LOW (ref 3.70–5.32)

## 2013-09-13 LAB — IRON AND TIBC CHCC
Iron: 115 ug/dL (ref 41–142)
TIBC: 216 ug/dL — ABNORMAL LOW (ref 236–444)
UIBC: 102 ug/dL — ABNORMAL LOW (ref 120–384)

## 2013-09-13 LAB — FERRITIN CHCC: Ferritin: 166 ng/ml (ref 9–269)

## 2013-09-13 MED ORDER — SODIUM CHLORIDE 0.9 % IV SOLN
INTRAVENOUS | Status: DC
  Administered 2013-09-13: 12:00:00 via INTRAVENOUS

## 2013-09-13 NOTE — Patient Instructions (Signed)
Therapeutic Phlebotomy Therapeutic phlebotomy is the controlled removal of blood from your body for the purpose of treating a medical condition. It is similar to donating blood. Usually, about a pint (470 mL) of blood is removed. The average adult has 9 to 12 pints (4.3 to 5.7 L) of blood. Therapeutic phlebotomy may be used to treat the following medical conditions:  Hemochromatosis. This is a condition in which there is too much iron in the blood.  Polycythemia vera. This is a condition in which there are too many red cells in the blood.  Porphyria cutanea tarda. This is a disease usually passed from one generation to the next (inherited). It is a condition in which an important part of hemoglobin is not made properly. This results in the build up of abnormal amounts of porphyrins in the body.  Sickle cell disease. This is an inherited disease. It is a condition in which the red blood cells form an abnormal crescent shape rather than a round shape. LET YOUR CAREGIVER KNOW ABOUT:  Allergies.  Medicines taken including herbs, eyedrops, over-the-counter medicines, and creams.  Use of steroids (by mouth or creams).  Previous problems with anesthetics or numbing medicine.  History of blood clots.  History of bleeding or blood problems.  Previous surgery.  Possibility of pregnancy, if this applies. RISKS AND COMPLICATIONS This is a simple and safe procedure. Problems are unlikely. However, problems can occur and may include:  Nausea or lightheadedness.  Low blood pressure.  Soreness, bleeding, swelling, or bruising at the needle insertion site.  Infection. BEFORE THE PROCEDURE  This is a procedure that can be done as an outpatient. Confirm the time that you need to arrive for your procedure. Confirm whether there is a need to fast or withhold any medications. It is helpful to wear clothing with sleeves that can be raised above the elbow. A blood sample may be done to determine the  amount of red blood cells or iron in your blood. Plan ahead of time to have someone drive you home after the procedure. PROCEDURE The entire procedure from preparation through recovery takes about 1 hour. The actual collection takes about 10 to 15 minutes.  A needle will be inserted into your vein.  Tubing and a collection bag will be attached to that needle.  Blood will flow through the needle and tubing into the collection bag.  You may be asked to open and close your hand slowly and continuously during the entire collection.  Once the specified amount of blood has been removed from your body, the collection bag and tubing will be clamped.  The needle will be removed.  Pressure will be held on the site of the needle insertion to stop the bleeding. Then a bandage will be placed over the needle insertion site. AFTER THE PROCEDURE  Your recovery will be assessed and monitored. If there are no problems, as an outpatient, you should be able to go home shortly after the procedure.  Document Released: 02/10/2011 Document Revised: 12/01/2011 Document Reviewed: 02/10/2011 [employer] Patient Information 2014 [employer].  

## 2013-09-14 ENCOUNTER — Encounter: Payer: Self-pay | Admitting: Nurse Practitioner

## 2013-09-19 ENCOUNTER — Ambulatory Visit: Payer: Medicare Other | Admitting: Hematology & Oncology

## 2013-09-19 ENCOUNTER — Other Ambulatory Visit: Payer: Medicare Other | Admitting: Lab

## 2013-09-20 ENCOUNTER — Ambulatory Visit (HOSPITAL_BASED_OUTPATIENT_CLINIC_OR_DEPARTMENT_OTHER): Payer: Medicare Other | Admitting: Lab

## 2013-09-20 ENCOUNTER — Ambulatory Visit (HOSPITAL_BASED_OUTPATIENT_CLINIC_OR_DEPARTMENT_OTHER): Payer: Medicare Other | Admitting: Hematology & Oncology

## 2013-09-20 LAB — CBC WITH DIFFERENTIAL (CANCER CENTER ONLY)
BASO%: 1.1 % (ref 0.0–2.0)
EOS%: 0.8 % (ref 0.0–7.0)
HCT: 32.1 % — ABNORMAL LOW (ref 34.8–46.6)
LYMPH%: 27.2 % (ref 14.0–48.0)
MCH: 37.4 pg — ABNORMAL HIGH (ref 26.0–34.0)
MCHC: 33.3 g/dL (ref 32.0–36.0)
MCV: 112 fL — ABNORMAL HIGH (ref 81–101)
MONO%: 9.2 % (ref 0.0–13.0)
NEUT#: 2.3 10*3/uL (ref 1.5–6.5)
NEUT%: 61.7 % (ref 39.6–80.0)
Platelets: 255 10*3/uL (ref 145–400)
RDW: 11.3 % (ref 11.1–15.7)
WBC: 3.7 10*3/uL — ABNORMAL LOW (ref 3.9–10.0)

## 2013-09-20 NOTE — Progress Notes (Signed)
This office note has been dictated.

## 2013-09-21 NOTE — Progress Notes (Signed)
CC:   Angela Mends. Panosh, MD  DIAGNOSIS:  Hemochromatosis (C282Y homozygous mutation).  CURRENT THERAPY:  Phlebotomy to maintain ferritin below 100.  INTERIM HISTORY:  Angela Burgess comes in for followup.  She is doing well with her phlebotomies.  We are getting the iron out of her system.  Her ferritin just a week ago was down at 166.  Iron saturation was down to 53%.  When we first started, her iron saturation was about 80%.  She has not noted any problems with nausea or vomiting.  She has had some issues with the phlebotomy.  However, she eats before she gets her phlebotomy.  We also gave her IV fluids with the phlebotomy.  She will be going to [facility] for a week or so in mid January.  I told that we would work around her trip to [place] to see her grandchildren and her daughter.  She has had no headache.  There has been no cough or shortness of breath.  She has had no change in bowel or bladder habits.  PHYSICAL EXAMINATION:  General:  This is a well-developed, well- nourished white female in no obvious distress.  Vital Signs: Temperature of 97.7, pulse 66, respiratory rate 14, blood pressure 119/45.  Weight is 130 pounds.  Head and Neck:  Normocephalic, atraumatic skull.  There are no ocular or oral lesions.  There are no palpable cervical or supraclavicular lymph nodes.  Lungs:  Clear bilaterally.  Cardiac:  Regular rate and rhythm with normal S1, S2. There are no murmurs, rubs, or bruits.  Abdomen:  Soft.  She has good bowel sounds.  There is no palpable abdominal mass.  There is no palpable hepatosplenomegaly.  Back:  No tenderness over the spine, ribs, or hips.  Extremities:  No clubbing, cyanosis, or edema.  Neurologic: No focal neurological deficits.  LABORATORY STUDIES:  White cell count is 3.7, hemoglobin 10.7, hematocrit 32.1, platelet count 255.  MCV is 112.  IMPRESSION:  Angela Burgess is very charming 73 year old white female with hemochromatosis.  Again, she has the  major mutation.  Again, we are getting the iron out of her system.  We will set her for 3 more phlebotomies.  I think, this will definitely get her iron below 100.  I will plan to see her back by myself in another couple of months or so.    ______________________________ Josph Macho, M.D. PRE/MEDQ  D:  09/20/2013  T:  09/21/2013  Job:  [id number]

## 2013-09-23 ENCOUNTER — Telehealth: Payer: Self-pay | Admitting: Hematology & Oncology

## 2013-09-23 NOTE — Telephone Encounter (Signed)
Left pt message to call there are several appointments I need to schedule

## 2013-09-26 ENCOUNTER — Ambulatory Visit (HOSPITAL_BASED_OUTPATIENT_CLINIC_OR_DEPARTMENT_OTHER): Payer: Medicare Other

## 2013-09-26 MED ORDER — SODIUM CHLORIDE 0.9 % IV SOLN
Freq: Once | INTRAVENOUS | Status: AC
  Administered 2013-09-26: 11:00:00 via INTRAVENOUS

## 2013-09-26 NOTE — Patient Instructions (Signed)

## 2013-09-26 NOTE — Progress Notes (Signed)
Angela Burgess presents today for phlebotomy per MD orders. Phlebotomy procedure started at 1045 and ended at 1055. 500 mls removed. Patient observed for 30 minutes after procedure without any incident. Patient tolerated procedure well. To receive 1000 mls NS post phlebotomy. IV needle removed intact.

## 2013-10-03 ENCOUNTER — Other Ambulatory Visit (HOSPITAL_BASED_OUTPATIENT_CLINIC_OR_DEPARTMENT_OTHER): Payer: Medicare Other | Admitting: Lab

## 2013-10-03 ENCOUNTER — Ambulatory Visit (HOSPITAL_BASED_OUTPATIENT_CLINIC_OR_DEPARTMENT_OTHER): Payer: Medicare Other

## 2013-10-03 LAB — CBC WITH DIFFERENTIAL (CANCER CENTER ONLY)
BASO#: 0 10*3/uL (ref 0.0–0.2)
BASO%: 0.5 % (ref 0.0–2.0)
EOS%: 0.8 % (ref 0.0–7.0)
Eosinophils Absolute: 0 10*3/uL (ref 0.0–0.5)
HCT: 34.4 % — ABNORMAL LOW (ref 34.8–46.6)
HGB: 11.6 g/dL (ref 11.6–15.9)
LYMPH#: 1 10*3/uL (ref 0.9–3.3)
LYMPH%: 25.1 % (ref 14.0–48.0)
MCH: 37.1 pg — AB (ref 26.0–34.0)
MCHC: 33.7 g/dL (ref 32.0–36.0)
MCV: 110 fL — AB (ref 81–101)
MONO#: 0.3 10*3/uL (ref 0.1–0.9)
MONO%: 6.9 % (ref 0.0–13.0)
NEUT#: 2.6 10*3/uL (ref 1.5–6.5)
NEUT%: 66.7 % (ref 39.6–80.0)
Platelets: 240 10*3/uL (ref 145–400)
RBC: 3.13 10*6/uL — ABNORMAL LOW (ref 3.70–5.32)
RDW: 10.7 % — AB (ref 11.1–15.7)
WBC: 3.9 10*3/uL (ref 3.9–10.0)

## 2013-10-03 MED ORDER — SODIUM CHLORIDE 0.9 % IV SOLN
Freq: Once | INTRAVENOUS | Status: AC
  Administered 2013-10-03: 11:00:00 via INTRAVENOUS

## 2013-10-03 NOTE — Patient Instructions (Signed)
Therapeutic Phlebotomy Therapeutic phlebotomy is the controlled removal of blood from your body for the purpose of treating a medical condition. It is similar to donating blood. Usually, about a pint (470 mL) of blood is removed. The average adult has 9 to 12 pints (4.3 to 5.7 L) of blood. Therapeutic phlebotomy may be used to treat the following medical conditions:  Hemochromatosis. This is a condition in which there is too much iron in the blood.  Polycythemia vera. This is a condition in which there are too many red cells in the blood.  Porphyria cutanea tarda. This is a disease usually passed from one generation to the next (inherited). It is a condition in which an important part of hemoglobin is not made properly. This results in the build up of abnormal amounts of porphyrins in the body.  Sickle cell disease. This is an inherited disease. It is a condition in which the red blood cells form an abnormal crescent shape rather than a round shape. LET YOUR CAREGIVER KNOW ABOUT:  Allergies.  Medicines taken including herbs, eyedrops, over-the-counter medicines, and creams.  Use of steroids (by mouth or creams).  Previous problems with anesthetics or numbing medicine.  History of blood clots.  History of bleeding or blood problems.  Previous surgery.  Possibility of pregnancy, if this applies. RISKS AND COMPLICATIONS This is a simple and safe procedure. Problems are unlikely. However, problems can occur and may include:  Nausea or lightheadedness.  Low blood pressure.  Soreness, bleeding, swelling, or bruising at the needle insertion site.  Infection. BEFORE THE PROCEDURE  This is a procedure that can be done as an outpatient. Confirm the time that you need to arrive for your procedure. Confirm whether there is a need to fast or withhold any medications. It is helpful to wear clothing with sleeves that can be raised above the elbow. A blood sample may be done to determine the  amount of red blood cells or iron in your blood. Plan ahead of time to have someone drive you home after the procedure. PROCEDURE The entire procedure from preparation through recovery takes about 1 hour. The actual collection takes about 10 to 15 minutes.  A needle will be inserted into your vein.  Tubing and a collection bag will be attached to that needle.  Blood will flow through the needle and tubing into the collection bag.  You may be asked to open and close your hand slowly and continuously during the entire collection.  Once the specified amount of blood has been removed from your body, the collection bag and tubing will be clamped.  The needle will be removed.  Pressure will be held on the site of the needle insertion to stop the bleeding. Then a bandage will be placed over the needle insertion site. AFTER THE PROCEDURE  Your recovery will be assessed and monitored. If there are no problems, as an outpatient, you should be able to go home shortly after the procedure.  Document Released: 02/10/2011 Document Revised: 12/01/2011 Document Reviewed: 02/10/2011 [employer] Patient Information 2014 [employer].  

## 2013-10-03 NOTE — Progress Notes (Signed)
Angela Burgess presents today for phlebotomy per MD orders. Phlebotomy procedure started at 1045 and ended at 1120. 500 mls removed. Patient observed for 30 minutes after procedure without any incident. Patient tolerated procedure well. IV needle removed intact.

## 2013-10-10 ENCOUNTER — Ambulatory Visit (HOSPITAL_BASED_OUTPATIENT_CLINIC_OR_DEPARTMENT_OTHER): Payer: Medicare Other

## 2013-10-10 ENCOUNTER — Other Ambulatory Visit: Payer: Self-pay | Admitting: Nurse Practitioner

## 2013-10-10 ENCOUNTER — Other Ambulatory Visit (HOSPITAL_BASED_OUTPATIENT_CLINIC_OR_DEPARTMENT_OTHER): Payer: Medicare Other | Admitting: Lab

## 2013-10-10 LAB — CBC WITH DIFFERENTIAL (CANCER CENTER ONLY)
BASO#: 0 10*3/uL (ref 0.0–0.2)
BASO%: 0.6 % (ref 0.0–2.0)
EOS%: 1.4 % (ref 0.0–7.0)
Eosinophils Absolute: 0.1 10*3/uL (ref 0.0–0.5)
HCT: 31.9 % — ABNORMAL LOW (ref 34.8–46.6)
HGB: 10.9 g/dL — ABNORMAL LOW (ref 11.6–15.9)
LYMPH#: 1 10*3/uL (ref 0.9–3.3)
LYMPH%: 29.5 % (ref 14.0–48.0)
MCH: 37.2 pg — AB (ref 26.0–34.0)
MCHC: 34.2 g/dL (ref 32.0–36.0)
MCV: 109 fL — ABNORMAL HIGH (ref 81–101)
MONO#: 0.3 10*3/uL (ref 0.1–0.9)
MONO%: 8.3 % (ref 0.0–13.0)
NEUT%: 60.2 % (ref 39.6–80.0)
NEUTROS ABS: 2.1 10*3/uL (ref 1.5–6.5)
Platelets: 238 10*3/uL (ref 145–400)
RBC: 2.93 10*6/uL — ABNORMAL LOW (ref 3.70–5.32)
RDW: 10.9 % — ABNORMAL LOW (ref 11.1–15.7)
WBC: 3.5 10*3/uL — ABNORMAL LOW (ref 3.9–10.0)

## 2013-10-10 MED ORDER — SODIUM CHLORIDE 0.9 % IV SOLN
Freq: Once | INTRAVENOUS | Status: AC
  Administered 2013-10-10: 11:00:00 via INTRAVENOUS

## 2013-10-10 NOTE — Progress Notes (Signed)
Angela Burgess presents today for phlebotomy per MD orders. Phlebotomy procedure started at 1050 and ended at 1110. 500 mls removed. Patient observed for 30 minutes after procedure without any incident. Patient tolerated procedure well. IV needle removed intact.

## 2013-10-10 NOTE — Patient Instructions (Signed)
Therapeutic Phlebotomy Therapeutic phlebotomy is the controlled removal of blood from your body for the purpose of treating a medical condition. It is similar to donating blood. Usually, about a pint (470 mL) of blood is removed. The average adult has 9 to 12 pints (4.3 to 5.7 L) of blood. Therapeutic phlebotomy may be used to treat the following medical conditions:  Hemochromatosis. This is a condition in which there is too much iron in the blood.  Polycythemia vera. This is a condition in which there are too many red cells in the blood.  Porphyria cutanea tarda. This is a disease usually passed from one generation to the next (inherited). It is a condition in which an important part of hemoglobin is not made properly. This results in the build up of abnormal amounts of porphyrins in the body.  Sickle cell disease. This is an inherited disease. It is a condition in which the red blood cells form an abnormal crescent shape rather than a round shape. LET YOUR CAREGIVER KNOW ABOUT:  Allergies.  Medicines taken including herbs, eyedrops, over-the-counter medicines, and creams.  Use of steroids (by mouth or creams).  Previous problems with anesthetics or numbing medicine.  History of blood clots.  History of bleeding or blood problems.  Previous surgery.  Possibility of pregnancy, if this applies. RISKS AND COMPLICATIONS This is a simple and safe procedure. Problems are unlikely. However, problems can occur and may include:  Nausea or lightheadedness.  Low blood pressure.  Soreness, bleeding, swelling, or bruising at the needle insertion site.  Infection. BEFORE THE PROCEDURE  This is a procedure that can be done as an outpatient. Confirm the time that you need to arrive for your procedure. Confirm whether there is a need to fast or withhold any medications. It is helpful to wear clothing with sleeves that can be raised above the elbow. A blood sample may be done to determine the  amount of red blood cells or iron in your blood. Plan ahead of time to have someone drive you home after the procedure. PROCEDURE The entire procedure from preparation through recovery takes about 1 hour. The actual collection takes about 10 to 15 minutes.  A needle will be inserted into your vein.  Tubing and a collection bag will be attached to that needle.  Blood will flow through the needle and tubing into the collection bag.  You may be asked to open and close your hand slowly and continuously during the entire collection.  Once the specified amount of blood has been removed from your body, the collection bag and tubing will be clamped.  The needle will be removed.  Pressure will be held on the site of the needle insertion to stop the bleeding. Then a bandage will be placed over the needle insertion site. AFTER THE PROCEDURE  Your recovery will be assessed and monitored. If there are no problems, as an outpatient, you should be able to go home shortly after the procedure.  Document Released: 02/10/2011 Document Revised: 12/01/2011 Document Reviewed: 02/10/2011 [employer] Patient Information 2014 [employer].  

## 2013-11-07 ENCOUNTER — Ambulatory Visit (HOSPITAL_BASED_OUTPATIENT_CLINIC_OR_DEPARTMENT_OTHER): Payer: Medicare Other | Admitting: Hematology & Oncology

## 2013-11-07 ENCOUNTER — Ambulatory Visit: Payer: Medicare Other | Admitting: Hematology & Oncology

## 2013-11-07 ENCOUNTER — Other Ambulatory Visit: Payer: Medicare Other | Admitting: Lab

## 2013-11-07 ENCOUNTER — Other Ambulatory Visit (HOSPITAL_BASED_OUTPATIENT_CLINIC_OR_DEPARTMENT_OTHER): Payer: Medicare Other | Admitting: Lab

## 2013-11-07 ENCOUNTER — Encounter: Payer: Self-pay | Admitting: Hematology & Oncology

## 2013-11-07 LAB — CBC WITH DIFFERENTIAL (CANCER CENTER ONLY)
BASO#: 0 10*3/uL (ref 0.0–0.2)
BASO%: 0.5 % (ref 0.0–2.0)
EOS%: 1.3 % (ref 0.0–7.0)
Eosinophils Absolute: 0.1 10*3/uL (ref 0.0–0.5)
HCT: 36.2 % (ref 34.8–46.6)
HEMOGLOBIN: 12.5 g/dL (ref 11.6–15.9)
LYMPH#: 1.3 10*3/uL (ref 0.9–3.3)
LYMPH%: 34.5 % (ref 14.0–48.0)
MCH: 36.2 pg — ABNORMAL HIGH (ref 26.0–34.0)
MCHC: 34.5 g/dL (ref 32.0–36.0)
MCV: 105 fL — ABNORMAL HIGH (ref 81–101)
MONO#: 0.4 10*3/uL (ref 0.1–0.9)
MONO%: 9.3 % (ref 0.0–13.0)
NEUT#: 2.1 10*3/uL (ref 1.5–6.5)
NEUT%: 54.4 % (ref 39.6–80.0)
PLATELETS: 200 10*3/uL (ref 145–400)
RBC: 3.45 10*6/uL — AB (ref 3.70–5.32)
RDW: 10.8 % — ABNORMAL LOW (ref 11.1–15.7)
WBC: 3.8 10*3/uL — ABNORMAL LOW (ref 3.9–10.0)

## 2013-11-07 LAB — FERRITIN CHCC: Ferritin: 138 ng/ml (ref 9–269)

## 2013-11-07 LAB — IRON AND TIBC CHCC
%SAT: 59 % — ABNORMAL HIGH (ref 21–57)
IRON: 137 ug/dL (ref 41–142)
TIBC: 230 ug/dL — AB (ref 236–444)
UIBC: 93 ug/dL — AB (ref 120–384)

## 2013-11-08 ENCOUNTER — Telehealth: Payer: Self-pay | Admitting: Nurse Practitioner

## 2013-11-08 ENCOUNTER — Other Ambulatory Visit: Payer: Self-pay | Admitting: Nurse Practitioner

## 2013-11-08 NOTE — Progress Notes (Signed)
Hematology and Oncology Follow Up Visit  GAYLEN PEREIRA [id number] [birth year] 73 y.o. 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  Current Therapy:    Phlebotomy to maintain ferritin < 100     Interim History:  Ms.  Totaro is in for followup.  She is doing ok.  Phlebotomies are going well Last ferritin is 168.  We give her IV fluids with the phlebotomies.  No weakness.  Appetite sis good.  No N/V.  No SOB.  No change in bowel or bladder. No ferver.  Medications: Current outpatient prescriptions:aspirin 81 MG tablet, Take 81 mg by mouth daily., Disp: , Rfl: ;  Astaxanthin 4 MG CAPS, Take 1 capsule by mouth 2 (two) times daily., Disp: , Rfl: ;  Calcium Carbonate-Vitamin D (CALCIUM 500/D) 500-125 MG-UNIT TABS, Take by mouth.  , Disp: , Rfl: ;  estradiol (ESTRACE) 0.1 MG/GM vaginal cream, Place 1 Applicatorful vaginally 2 (two) times a week. , Disp: , Rfl:  fish oil-omega-3 fatty acids 1000 MG capsule, Take 2 g by mouth daily.  , Disp: , Rfl: ;  folic acid (FOLVITE) 1 MG tablet, Take 1 mg by mouth daily.  , Disp: , Rfl: ;  levothyroxine (SYNTHROID, LEVOTHROID) 112 MCG tablet, TAKE 1 TABLET (112 MCG TOTAL) BY MOUTH DAILY., Disp: 90 tablet, Rfl: 1;  metroNIDAZOLE (METROCREAM) 0.75 % cream, Apply 1 application topically daily.  , Disp: , Rfl:  Multiple Vitamin (MULTIVITAMIN) tablet, Take 1 tablet by mouth daily.  , Disp: , Rfl:   Allergies:  Allergies  Allergen Reactions  . Tetracycline     Past Medical History, Surgical history, Social history, and Family History were reviewed and updated.  Review of Systems: As above  Physical Exam:  height is 5\' 4"  (1.626 m) and weight is 130 lb (58.968 kg). Her oral temperature is 97.6 F (36.4 C). Her blood pressure is 140/38 and her pulse is 66. Her respiration is 14.   No adenopathy.  Lungs are clear.  Cardiac exam is RRR.  Abdomen is soft.  No neuro findings.  Extremities are strong.  Joints w/o swelling.   Skin is (-).  Lab Results  Component Value Date   WBC 3.8* 11/07/2013   HGB 12.5 11/07/2013   HCT 36.2 11/07/2013   MCV 105* 11/07/2013   PLT 200 11/07/2013     Chemistry      Component Value Date/Time   NA 138 06/29/2013 1531   K 4.4 06/29/2013 1531   CL 100 06/29/2013 1531   CO2 32 06/29/2013 1531   BUN 19 06/29/2013 1531   CREATININE 0.71 06/29/2013 1531      Component Value Date/Time   CALCIUM 9.5 06/29/2013 1531   ALKPHOS 63 06/29/2013 1531   AST 17 06/29/2013 1531   ALT 13 06/29/2013 1531   BILITOT 0.3 06/29/2013 1531      Ferritin is 133.   Impression and Plan: Ms. Dubow is a 73yo with hemochromatosis.  Has homozugous mutation for C282Y gene.  Need to continue phlebotomy program to get ferritin < 100.  I will see her back in 4 weeks.  Need 2 more phlebotomies.[name], MD 2/17/201512:22 PM

## 2013-11-08 NOTE — Telephone Encounter (Addendum)
Message copied by Jimmy Footman on Tue Nov 08, 2013 12:19 PM ------      Message from: Burney Gauze R      Created: Mon Nov 07, 2013  8:53 PM       Call - ferritin is 138.  We are getting there!!  Need to phlebotomize q week x 2 week.  Please set up. Pete ------Pt verbalized understanding and will be scheduled for 2/26 @1030  and 3/5 @11 .

## 2013-11-09 ENCOUNTER — Telehealth: Payer: Self-pay | Admitting: Internal Medicine

## 2013-11-09 MED ORDER — LEVOTHYROXINE SODIUM 112 MCG PO TABS: ORAL_TABLET | ORAL | Status: DC

## 2013-11-09 NOTE — Telephone Encounter (Signed)
90 day supply sent to the pharmacy.  Pt is approaching time for lab work.

## 2013-11-09 NOTE — Telephone Encounter (Signed)
Pt needs new rx levothyroxine 112 mcg #90 W/REFILLS SENT TO NEW PHARM [facility] [phone]

## 2013-11-24 ENCOUNTER — Ambulatory Visit (HOSPITAL_BASED_OUTPATIENT_CLINIC_OR_DEPARTMENT_OTHER): Payer: Medicare Other

## 2013-11-24 MED ORDER — SODIUM CHLORIDE 0.9 % IV SOLN
Freq: Once | INTRAVENOUS | Status: AC
  Administered 2013-11-24: 12:00:00 via INTRAVENOUS

## 2013-11-24 NOTE — Patient Instructions (Signed)
Therapeutic Phlebotomy Therapeutic phlebotomy is the controlled removal of blood from your body for the purpose of treating a medical condition. It is similar to donating blood. Usually, about a pint (470 mL) of blood is removed. The average adult has 9 to 12 pints (4.3 to 5.7 L) of blood. Therapeutic phlebotomy may be used to treat the following medical conditions:  Hemochromatosis. This is a condition in which there is too much iron in the blood.  Polycythemia vera. This is a condition in which there are too many red cells in the blood.  Porphyria cutanea tarda. This is a disease usually passed from one generation to the next (inherited). It is a condition in which an important part of hemoglobin is not made properly. This results in the build up of abnormal amounts of porphyrins in the body.  Sickle cell disease. This is an inherited disease. It is a condition in which the red blood cells form an abnormal crescent shape rather than a round shape. LET YOUR CAREGIVER KNOW ABOUT:  Allergies.  Medicines taken including herbs, eyedrops, over-the-counter medicines, and creams.  Use of steroids (by mouth or creams).  Previous problems with anesthetics or numbing medicine.  History of blood clots.  History of bleeding or blood problems.  Previous surgery.  Possibility of pregnancy, if this applies. RISKS AND COMPLICATIONS This is a simple and safe procedure. Problems are unlikely. However, problems can occur and may include:  Nausea or lightheadedness.  Low blood pressure.  Soreness, bleeding, swelling, or bruising at the needle insertion site.  Infection. BEFORE THE PROCEDURE  This is a procedure that can be done as an outpatient. Confirm the time that you need to arrive for your procedure. Confirm whether there is a need to fast or withhold any medications. It is helpful to wear clothing with sleeves that can be raised above the elbow. A blood sample may be done to determine the  amount of red blood cells or iron in your blood. Plan ahead of time to have someone drive you home after the procedure. PROCEDURE The entire procedure from preparation through recovery takes about 1 hour. The actual collection takes about 10 to 15 minutes.  A needle will be inserted into your vein.  Tubing and a collection bag will be attached to that needle.  Blood will flow through the needle and tubing into the collection bag.  You may be asked to open and close your hand slowly and continuously during the entire collection.  Once the specified amount of blood has been removed from your body, the collection bag and tubing will be clamped.  The needle will be removed.  Pressure will be held on the site of the needle insertion to stop the bleeding. Then a bandage will be placed over the needle insertion site. AFTER THE PROCEDURE  Your recovery will be assessed and monitored. If there are no problems, as an outpatient, you should be able to go home shortly after the procedure.  Document Released: 02/10/2011 Document Revised: 12/01/2011 Document Reviewed: 02/10/2011 [employer] Patient Information 2014 [employer].  

## 2013-11-24 NOTE — Progress Notes (Signed)
Angela Burgess presents today for phlebotomy per MD orders. Phlebotomy procedure started at 1113 and ended at 1125. Approximately 500 mls removed. Patient observed for 30 minutes after procedure without any incident. Patient tolerated procedure well. Pt to receive 1000 mls normal saline.  IV needle removed intact.

## 2013-12-01 ENCOUNTER — Ambulatory Visit (HOSPITAL_BASED_OUTPATIENT_CLINIC_OR_DEPARTMENT_OTHER): Payer: Medicare Other

## 2013-12-01 MED ORDER — SODIUM CHLORIDE 0.9 % IV SOLN
Freq: Once | INTRAVENOUS | Status: AC
  Administered 2013-12-01: 12:00:00 via INTRAVENOUS

## 2013-12-01 NOTE — Progress Notes (Signed)
Angela Burgess presents today for phlebotomy per MD orders. Phlebotomy procedure started at 1145 and ended at 1200. 500 mls removed. Pt to receive 1023ml NS post phlebotomy. Patient observed for 30 minutes after procedure without any incident. Patient tolerated procedure well. IV needle removed intact.

## 2013-12-01 NOTE — Patient Instructions (Signed)

## 2013-12-05 ENCOUNTER — Ambulatory Visit (HOSPITAL_BASED_OUTPATIENT_CLINIC_OR_DEPARTMENT_OTHER): Payer: Medicare Other | Admitting: Hematology & Oncology

## 2013-12-05 ENCOUNTER — Other Ambulatory Visit (HOSPITAL_BASED_OUTPATIENT_CLINIC_OR_DEPARTMENT_OTHER): Payer: Medicare Other | Admitting: Lab

## 2013-12-05 ENCOUNTER — Encounter: Payer: Self-pay | Admitting: Hematology & Oncology

## 2013-12-05 LAB — CBC WITH DIFFERENTIAL (CANCER CENTER ONLY)
BASO#: 0 10*3/uL (ref 0.0–0.2)
BASO%: 0.9 % (ref 0.0–2.0)
EOS ABS: 0.1 10*3/uL (ref 0.0–0.5)
EOS%: 1.4 % (ref 0.0–7.0)
HCT: 30.7 % — ABNORMAL LOW (ref 34.8–46.6)
HGB: 10.5 g/dL — ABNORMAL LOW (ref 11.6–15.9)
LYMPH#: 1.2 10*3/uL (ref 0.9–3.3)
LYMPH%: 34.9 % (ref 14.0–48.0)
MCH: 35.7 pg — ABNORMAL HIGH (ref 26.0–34.0)
MCHC: 34.2 g/dL (ref 32.0–36.0)
MCV: 104 fL — AB (ref 81–101)
MONO#: 0.3 10*3/uL (ref 0.1–0.9)
MONO%: 7.2 % (ref 0.0–13.0)
NEUT#: 1.9 10*3/uL (ref 1.5–6.5)
NEUT%: 55.6 % (ref 39.6–80.0)
Platelets: 216 10*3/uL (ref 145–400)
RBC: 2.94 10*6/uL — AB (ref 3.70–5.32)
RDW: 11.1 % (ref 11.1–15.7)
WBC: 3.5 10*3/uL — ABNORMAL LOW (ref 3.9–10.0)

## 2013-12-05 LAB — FERRITIN CHCC: Ferritin: 109 ng/ml (ref 9–269)

## 2013-12-05 LAB — IRON AND TIBC CHCC
%SAT: 78 % — ABNORMAL HIGH (ref 21–57)
IRON: 168 ug/dL — AB (ref 41–142)
TIBC: 215 ug/dL — ABNORMAL LOW (ref 236–444)
UIBC: 47 ug/dL — ABNORMAL LOW (ref 120–384)

## 2013-12-05 NOTE — Progress Notes (Signed)
Hematology and Oncology Follow Up Visit  YUVAL NOLET [id number] [birth year] 73 y.o. 12/05/2013   Principle Diagnosis:   Hemachromatosis (C282Y) --homozygous mutation    Current Therapy:    Phlebotomy to maintain ferritin below 100     Interim History:  Ms.  Flett is is back for followup. She's doing well. She was last phlebotomized in February. At that point, her ferritin was 139.  She feels well. She does well with phlebotomies. At that we give her some fluids with the phlebotomies.  She's had no problems pain once. She's had no problems with joints. She's had no nausea vomiting. The been no change in bowel or bladder habits. She's had no fevers sweats or chills. There's been no rashes.  Medications: Current outpatient prescriptions:aspirin 81 MG tablet, Take 81 mg by mouth daily., Disp: , Rfl: ;  Astaxanthin 4 MG CAPS, Take 1 capsule by mouth 2 (two) times daily., Disp: , Rfl: ;  Calcium Carbonate-Vitamin D (CALCIUM 500/D) 500-125 MG-UNIT TABS, Take by mouth.  , Disp: , Rfl: ;  estradiol (ESTRACE) 0.1 MG/GM vaginal cream, Place 1 Applicatorful vaginally 2 (two) times a week. , Disp: , Rfl:  fish oil-omega-3 fatty acids 1000 MG capsule, Take 2 g by mouth daily.  , Disp: , Rfl: ;  folic acid (FOLVITE) 1 MG tablet, Take 1 mg by mouth daily.  , Disp: , Rfl: ;  levothyroxine (SYNTHROID, LEVOTHROID) 112 MCG tablet, TAKE 1 TABLET (112 MCG TOTAL) BY MOUTH DAILY., Disp: 90 tablet, Rfl: 0;  metroNIDAZOLE (METROCREAM) 0.75 % cream, Apply 1 application topically daily.  , Disp: , Rfl:  Multiple Vitamin (MULTIVITAMIN) tablet, Take 1 tablet by mouth daily.  , Disp: , Rfl:   Allergies:  Allergies  Allergen Reactions  . Tetracycline     Past Medical History, Surgical history, Social history, and Family History were reviewed and updated.  Review of Systems: As above  Physical Exam:  weight is 131 lb (59.421 kg). Her temperature is 97.3 F (36.3 C). Her blood pressure is 125/60 and her  pulse is 66. Her respiration is 18.   Well-developed well-nourished female. There's no adenopathy in her neck. Lungs are clear. Cardiac exam regular in rhythm. Abdomen soft. She's good bowel sounds. There is no fluid wave. There is no palpable hepato- splenomegaly. Back exam no tenderness over the spine ribs or hips. Extremities shows no clubbing cyanosis or edema. Skin exam no rashes. Neurological exam no focal findings.  Lab Results  Component Value Date   WBC 3.5* 12/05/2013   HGB 10.5* 12/05/2013   HCT 30.7* 12/05/2013   MCV 104* 12/05/2013   PLT 216 12/05/2013     Chemistry      Component Value Date/Time   NA 138 06/29/2013 1531   K 4.4 06/29/2013 1531   CL 100 06/29/2013 1531   CO2 32 06/29/2013 1531   BUN 19 06/29/2013 1531   CREATININE 0.71 06/29/2013 1531      Component Value Date/Time   CALCIUM 9.5 06/29/2013 1531   ALKPHOS 63 06/29/2013 1531   AST 17 06/29/2013 1531   ALT 13 06/29/2013 1531   BILITOT 0.3 06/29/2013 1531         Impression and Plan: Ms. Siragusa is a 73 year old white female with hemachromatosis. She is homozygous for the major mutation.  We will see what her iron levels are. Hopefully, we will get her ferritin down below 100.  She really wants a break from phlebotomies. As such, I think we can  get her back in 6 weeks' time.    [name], MD 3/16/201510:38 AM

## 2013-12-06 ENCOUNTER — Other Ambulatory Visit: Payer: Self-pay | Admitting: Internal Medicine

## 2014-01-19 ENCOUNTER — Other Ambulatory Visit (HOSPITAL_BASED_OUTPATIENT_CLINIC_OR_DEPARTMENT_OTHER): Payer: Medicare Other | Admitting: Lab

## 2014-01-19 ENCOUNTER — Ambulatory Visit (HOSPITAL_BASED_OUTPATIENT_CLINIC_OR_DEPARTMENT_OTHER): Payer: Medicare Other | Admitting: Hematology & Oncology

## 2014-01-19 ENCOUNTER — Encounter: Payer: Self-pay | Admitting: Hematology & Oncology

## 2014-01-19 LAB — COMPREHENSIVE METABOLIC PANEL
ALT: 10 U/L (ref 0–35)
AST: 14 U/L (ref 0–37)
Albumin: 4.2 g/dL (ref 3.5–5.2)
Alkaline Phosphatase: 49 U/L (ref 39–117)
BILIRUBIN TOTAL: 0.4 mg/dL (ref 0.2–1.2)
BUN: 15 mg/dL (ref 6–23)
CO2: 30 meq/L (ref 19–32)
Calcium: 9.7 mg/dL (ref 8.4–10.5)
Chloride: 102 mEq/L (ref 96–112)
Creatinine, Ser: 0.71 mg/dL (ref 0.50–1.10)
Glucose, Bld: 87 mg/dL (ref 70–99)
Potassium: 3.9 mEq/L (ref 3.5–5.3)
SODIUM: 141 meq/L (ref 135–145)
Total Protein: 6.4 g/dL (ref 6.0–8.3)

## 2014-01-19 LAB — CBC WITH DIFFERENTIAL (CANCER CENTER ONLY)
BASO#: 0 10*3/uL (ref 0.0–0.2)
BASO%: 0.4 % (ref 0.0–2.0)
EOS%: 1.1 % (ref 0.0–7.0)
Eosinophils Absolute: 0.1 10*3/uL (ref 0.0–0.5)
HCT: 37.3 % (ref 34.8–46.6)
HGB: 12.8 g/dL (ref 11.6–15.9)
LYMPH#: 1.3 10*3/uL (ref 0.9–3.3)
LYMPH%: 30 % (ref 14.0–48.0)
MCH: 35.8 pg — ABNORMAL HIGH (ref 26.0–34.0)
MCHC: 34.3 g/dL (ref 32.0–36.0)
MCV: 104 fL — AB (ref 81–101)
MONO#: 0.3 10*3/uL (ref 0.1–0.9)
MONO%: 7.6 % (ref 0.0–13.0)
NEUT#: 2.7 10*3/uL (ref 1.5–6.5)
NEUT%: 60.9 % (ref 39.6–80.0)
Platelets: 195 10*3/uL (ref 145–400)
RBC: 3.58 10*6/uL — ABNORMAL LOW (ref 3.70–5.32)
RDW: 11.3 % (ref 11.1–15.7)
WBC: 4.5 10*3/uL (ref 3.9–10.0)

## 2014-01-19 LAB — IRON AND TIBC CHCC
%SAT: 84 % — ABNORMAL HIGH (ref 21–57)
IRON: 178 ug/dL — AB (ref 41–142)
TIBC: 213 ug/dL — ABNORMAL LOW (ref 236–444)
UIBC: 35 ug/dL — ABNORMAL LOW (ref 120–384)

## 2014-01-19 LAB — FERRITIN CHCC: Ferritin: 130 ng/ml (ref 9–269)

## 2014-01-19 NOTE — Progress Notes (Signed)
Hematology and Oncology Follow Up Visit  Angela Burgess [id number] 1940-12-01 73 y.o. 01/19/2014   Principle Diagnosis:   Hemachromatosis (C282Y) --homozygous mutation  Current Therapy:   Phlebotomy to maintain a ferritin below 100    Interim History:  Ms.  Burgess is back for followup. She's doing very well. She and her husband discovered back from [place] state. That did come up there. They're taking care of grandchildren.  Other she was last phlebotomized back in early late March. We'll last saw her, her ferritin was 109. She still does have a fairly high iron saturation.  HEENT watch what she eats. She's not taking any supplemental iron. She is on a multivitamin.  [name]: Current outpatient prescriptions:aspirin 81 MG tablet, Take 81 mg by mouth daily., Disp: , Rfl: ;  Astaxanthin 4 MG CAPS, Take 1 capsule by mouth 2 (two) times daily., Disp: , Rfl: ;  Calcium Carbonate-Vitamin D (CALCIUM 500/D) 500-125 MG-UNIT TABS, Take by mouth.  , Disp: , Rfl: ;  estradiol (ESTRACE) 0.1 MG/GM vaginal cream, Place 1 Applicatorful vaginally 2 (two) times a week. , Disp: , Rfl:  fish oil-omega-3 fatty acids 1000 MG capsule, Take 2 g by mouth daily.  , Disp: , Rfl: ;  levothyroxine (SYNTHROID, LEVOTHROID) 112 MCG tablet, TAKE 1 TABLET (112 MCG TOTAL) BY MOUTH DAILY., Disp: 90 tablet, Rfl: 0;  metroNIDAZOLE (METROCREAM) 0.75 % cream, Apply 1 application topically daily.  , Disp: , Rfl: ;  Multiple Vitamin (MULTIVITAMIN) tablet, Take 1 tablet by mouth daily.  , Disp: , Rfl:   Allergies:  Allergies  Allergen Reactions  . Tetracycline     Past Medical History, Surgical history, Social history, and Family History were reviewed and updated.  Review of Systems: As above  Physical Exam:  height is 5\' 4"  (1.626 m) and weight is 128 lb (58.06 kg). Her oral temperature is 97.5 F (36.4 C). Her blood  pressure is 124/50 and her pulse is 51. Her respiration is 14.   Well-developed well-nourished white female. Lungs are clear. Cardiac exam regular in rhythm. Joints do not show any osteoarthritic changes. Abdomen is soft. She has no palpable liver or spleen tip. Skin exam no rashes. Neurological exam nonfocal. To exam no palpable lymph glands. 73 year old white female with hemachromatosis Angela Burgess  Lab Results  Component Value Date   WBC 4.5 01/19/2014   HGB 12.8 01/19/2014   HCT 37.3 01/19/2014   MCV 104* 01/19/2014   PLT 195 01/19/2014     Chemistry      Component Value Date/Time   NA 138 06/29/2013 1531   K 4.4 06/29/2013 1531   CL 100 06/29/2013 1531   CO2 32 06/29/2013 1531   BUN 19 06/29/2013 1531   CREATININE 0.71 06/29/2013 1531      Component Value Date/Time   CALCIUM 9.5 06/29/2013 1531   ALKPHOS 63 06/29/2013 1531   AST 17 06/29/2013 1531   ALT 13 06/29/2013 1531   BILITOT 0.3 06/29/2013 1531         Impression and Plan: Angela Burgess is 73 year old female with hemachromatosis. She is homozygous for the major mutation.  We are following her lab work. We will see what her ferritin level is. This really will dictate we'll have to get her back to see [place].  We'll have the ferritin level back later on. I will then call her and we'll then  set up appointments for her.   [name], MD 4/30/201510:43 AM

## 2014-01-19 NOTE — Addendum Note (Signed)
Addended by: Burney Gauze R on: 01/19/2014 07:18 PM   Modules accepted: Orders

## 2014-01-20 ENCOUNTER — Telehealth: Payer: Self-pay | Admitting: Hematology & Oncology

## 2014-01-20 NOTE — Telephone Encounter (Signed)
Left pt message with 6-5 MD appointment and to call for 3 weekly appointments

## 2014-01-20 NOTE — Telephone Encounter (Signed)
Pt aware of 5-4,5-14 and 5-21 phlebotomies.

## 2014-01-23 ENCOUNTER — Ambulatory Visit (HOSPITAL_BASED_OUTPATIENT_CLINIC_OR_DEPARTMENT_OTHER): Payer: Medicare Other

## 2014-01-23 MED ORDER — SODIUM CHLORIDE 0.9 % IV SOLN
Freq: Once | INTRAVENOUS | Status: AC
  Administered 2014-01-23: 13:00:00 via INTRAVENOUS

## 2014-01-23 NOTE — Patient Instructions (Signed)

## 2014-01-23 NOTE — Progress Notes (Signed)
Angela Burgess presents today for phlebotomy per MD orders. Phlebotomy procedure started at 1120 and ended at 1235. Approximately 500 mls removed. Patient observed for 30 minutes after procedure without any incident. Patient tolerated procedure well, IVFs given post phlebotomy. IV needle removed intact.

## 2014-02-02 ENCOUNTER — Other Ambulatory Visit (HOSPITAL_BASED_OUTPATIENT_CLINIC_OR_DEPARTMENT_OTHER): Payer: Medicare Other | Admitting: Lab

## 2014-02-02 ENCOUNTER — Ambulatory Visit (HOSPITAL_BASED_OUTPATIENT_CLINIC_OR_DEPARTMENT_OTHER): Payer: Medicare Other

## 2014-02-02 LAB — CBC WITH DIFFERENTIAL (CANCER CENTER ONLY)
BASO#: 0 10*3/uL (ref 0.0–0.2)
BASO%: 0.5 % (ref 0.0–2.0)
EOS%: 0.8 % (ref 0.0–7.0)
Eosinophils Absolute: 0 10*3/uL (ref 0.0–0.5)
HCT: 33.9 % — ABNORMAL LOW (ref 34.8–46.6)
HGB: 11.7 g/dL (ref 11.6–15.9)
LYMPH#: 1.1 10*3/uL (ref 0.9–3.3)
LYMPH%: 28.6 % (ref 14.0–48.0)
MCH: 36.2 pg — ABNORMAL HIGH (ref 26.0–34.0)
MCHC: 34.5 g/dL (ref 32.0–36.0)
MCV: 105 fL — ABNORMAL HIGH (ref 81–101)
MONO#: 0.3 10*3/uL (ref 0.1–0.9)
MONO%: 7.2 % (ref 0.0–13.0)
NEUT%: 62.9 % (ref 39.6–80.0)
NEUTROS ABS: 2.4 10*3/uL (ref 1.5–6.5)
Platelets: 255 10*3/uL (ref 145–400)
RBC: 3.23 10*6/uL — ABNORMAL LOW (ref 3.70–5.32)
RDW: 11.6 % (ref 11.1–15.7)
WBC: 3.8 10*3/uL — ABNORMAL LOW (ref 3.9–10.0)

## 2014-02-02 MED ORDER — SODIUM CHLORIDE 0.9 % IV SOLN
INTRAVENOUS | Status: DC
  Administered 2014-02-02: 11:00:00 via INTRAVENOUS

## 2014-02-02 NOTE — Progress Notes (Signed)
Angela Burgess presents today for phlebotomy per MD orders. Phlebotomy procedure started at 1030 and ended at 1055. 439mL removed. Pt tolerated well ,but she did become anxious and the phlebotomy procedure was stopped. Patient observed for 30 minutes after procedure without any incident. Patient tolerated procedure well. IV needle removed intact.

## 2014-02-02 NOTE — Patient Instructions (Signed)

## 2014-02-09 ENCOUNTER — Other Ambulatory Visit (HOSPITAL_BASED_OUTPATIENT_CLINIC_OR_DEPARTMENT_OTHER): Payer: Medicare Other | Admitting: Lab

## 2014-02-09 ENCOUNTER — Ambulatory Visit (HOSPITAL_BASED_OUTPATIENT_CLINIC_OR_DEPARTMENT_OTHER): Payer: Medicare Other

## 2014-02-09 LAB — CBC WITH DIFFERENTIAL (CANCER CENTER ONLY)
BASO#: 0 10*3/uL (ref 0.0–0.2)
BASO%: 0.5 % (ref 0.0–2.0)
EOS ABS: 0 10*3/uL (ref 0.0–0.5)
EOS%: 0.9 % (ref 0.0–7.0)
HEMATOCRIT: 32.4 % — AB (ref 34.8–46.6)
HEMOGLOBIN: 11.3 g/dL — AB (ref 11.6–15.9)
LYMPH#: 1.2 10*3/uL (ref 0.9–3.3)
LYMPH%: 27.7 % (ref 14.0–48.0)
MCH: 36.9 pg — ABNORMAL HIGH (ref 26.0–34.0)
MCHC: 34.9 g/dL (ref 32.0–36.0)
MCV: 106 fL — ABNORMAL HIGH (ref 81–101)
MONO#: 0.3 10*3/uL (ref 0.1–0.9)
MONO%: 7.7 % (ref 0.0–13.0)
NEUT%: 63.2 % (ref 39.6–80.0)
NEUTROS ABS: 2.8 10*3/uL (ref 1.5–6.5)
Platelets: 247 10*3/uL (ref 145–400)
RBC: 3.06 10*6/uL — AB (ref 3.70–5.32)
RDW: 11.9 % (ref 11.1–15.7)
WBC: 4.4 10*3/uL (ref 3.9–10.0)

## 2014-02-09 MED ORDER — SODIUM CHLORIDE 0.9 % IV SOLN: Freq: Once | INTRAVENOUS | Status: DC

## 2014-02-09 NOTE — Patient Instructions (Signed)

## 2014-02-09 NOTE — Progress Notes (Signed)
Angela Burgess presents today for phlebotomy per MD orders. Phlebotomy procedure started at 1040 and ended at 1100. 586mL removed. Patient observed for 30 minutes after procedure without any incident. Patient tolerated procedure well. IV needle removed intact.

## 2014-02-14 ENCOUNTER — Other Ambulatory Visit: Payer: Self-pay | Admitting: Internal Medicine

## 2014-02-14 NOTE — Telephone Encounter (Signed)
She is due for tsh level Please have this done  Refill 90 days until labs back  ( due for CPX in December )

## 2014-02-14 NOTE — Telephone Encounter (Signed)
Patient is due for labs.

## 2014-02-15 ENCOUNTER — Telehealth: Payer: Self-pay | Admitting: Family Medicine

## 2014-02-15 DIAGNOSIS — E039 Hypothyroidism, unspecified: Secondary | ICD-10-CM

## 2014-02-15 NOTE — Telephone Encounter (Signed)
This patient is past due for a TSH level.  I have placed the order.  Please call the patient and make an appt.  She will also need her wellness visit in Dec.  Please make that appt as well.  Thanks!

## 2014-02-15 NOTE — Telephone Encounter (Signed)
Contact patient. Tell her her last tsh was may 2014  Perhaps tsh can be done when she has ferritin done through hemtology

## 2014-02-15 NOTE — Telephone Encounter (Signed)
Pt decline to come in for tsh only. Pt has sch her wellness for dec 2015

## 2014-02-24 ENCOUNTER — Other Ambulatory Visit (HOSPITAL_BASED_OUTPATIENT_CLINIC_OR_DEPARTMENT_OTHER): Payer: Medicare Other | Admitting: Lab

## 2014-02-24 ENCOUNTER — Ambulatory Visit (HOSPITAL_BASED_OUTPATIENT_CLINIC_OR_DEPARTMENT_OTHER): Payer: Medicare Other | Admitting: Hematology & Oncology

## 2014-02-24 ENCOUNTER — Encounter: Payer: Self-pay | Admitting: Hematology & Oncology

## 2014-02-24 LAB — CBC WITH DIFFERENTIAL (CANCER CENTER ONLY)
BASO#: 0 10*3/uL (ref 0.0–0.2)
BASO%: 0.5 % (ref 0.0–2.0)
EOS%: 0.8 % (ref 0.0–7.0)
Eosinophils Absolute: 0 10*3/uL (ref 0.0–0.5)
HCT: 32.7 % — ABNORMAL LOW (ref 34.8–46.6)
HGB: 11.3 g/dL — ABNORMAL LOW (ref 11.6–15.9)
LYMPH#: 1.2 10*3/uL (ref 0.9–3.3)
LYMPH%: 31.5 % (ref 14.0–48.0)
MCH: 36.8 pg — AB (ref 26.0–34.0)
MCHC: 34.6 g/dL (ref 32.0–36.0)
MCV: 107 fL — ABNORMAL HIGH (ref 81–101)
MONO#: 0.4 10*3/uL (ref 0.1–0.9)
MONO%: 9.5 % (ref 0.0–13.0)
NEUT#: 2.3 10*3/uL (ref 1.5–6.5)
NEUT%: 57.7 % (ref 39.6–80.0)
PLATELETS: 245 10*3/uL (ref 145–400)
RBC: 3.07 10*6/uL — ABNORMAL LOW (ref 3.70–5.32)
RDW: 11.9 % (ref 11.1–15.7)
WBC: 3.9 10*3/uL (ref 3.9–10.0)

## 2014-02-24 NOTE — Progress Notes (Signed)
Hematology and Oncology Follow Up Visit  Angela Burgess [id number] September 01, 1941 73 y.o. 02/24/2014   Principle Diagnosis:   Hemachromatosis (C282Y) --homozygous mutation  Current Therapy:   Phlebotomy to maintain a ferritin below 100       Interim History:  Ms.  Burgess is in for followup. She's doing well. She was doing well with phlebotomies. We still I have a tough time getting the iron out of her body. Back in April, her ferritin was 130. Her iron saturation was 84%.  She is not taking any vitamin see. She is staying active. She's had no problems with nausea vomiting. There's been no cough. She's had no change in bowel or bladder habits. Medications: Current outpatient prescriptions:Cholecalciferol (VITAMIN D3) 2000 UNITS TABS, Take by mouth every morning., Disp: , Rfl: ;  aspirin 81 MG tablet, Take 81 mg by mouth daily., Disp: , Rfl: ;  Astaxanthin 4 MG CAPS, Take 1 capsule by mouth 2 (two) times daily., Disp: , Rfl: ;  Calcium Carbonate-Vitamin D (CALCIUM 500/D) 500-125 MG-UNIT TABS, Take by mouth.  , Disp: , Rfl:  estradiol (ESTRACE) 0.1 MG/GM vaginal cream, Place 1 Applicatorful vaginally 2 (two) times a week. , Disp: , Rfl: ;  fish oil-omega-3 fatty acids 1000 MG capsule, Take 2 g by mouth daily.  , Disp: , Rfl: ;  levothyroxine (SYNTHROID, LEVOTHROID) 112 MCG tablet, TAKE 1 TABLET BY MOUTH DAILY, Disp: 90 tablet, Rfl: 0;  metroNIDAZOLE (METROCREAM) 0.75 % cream, Apply 1 application topically daily.  , Disp: , Rfl:  Multiple Vitamin (MULTIVITAMIN) tablet, Take 1 tablet by mouth daily.  , Disp: , Rfl:   Allergies:  Allergies  Allergen Reactions  . Tetracycline     Past Medical History, Surgical history, Social history, and Family History were reviewed and updated.  Review of Systems: As above  Physical Exam:  height is 5\' 4"  (1.626 m) and weight is 132 lb (59.875 kg). Her oral temperature is 97.9 F (36.6 C). Her blood pressure is 122/41 and her pulse is 66. Her respiration is  14.   Well-developed and well-nourished white female. Head and neck exam shows no ocular or oral lesions. There is no scleral icterus. She has no adenopathy in the neck. Lungs are clear. Cardiac exam regular rate and rhythm with no murmurs rubs or bruits. Abdomen is soft. She's good bowel sounds. There is no fluid wave. There is no palpable liver or spleen tip. Neck exam shows some slight kyphosis. There is no tenderness over the spine. Extremities shows no clubbing cyanosis or edema. Neurological exam is nonfocal.  Lab Results  Component Value Date   WBC 3.9 02/24/2014   HGB 11.3* 02/24/2014   HCT 32.7* 02/24/2014   MCV 107* 02/24/2014   PLT 245 02/24/2014     Chemistry      Component Value Date/Time   NA 141 01/19/2014 0942   K 3.9 01/19/2014 0942   CL 102 01/19/2014 0942   CO2 30 01/19/2014 0942   BUN 15 01/19/2014 0942   CREATININE 0.71 01/19/2014 0942      Component Value Date/Time   CALCIUM 9.7 01/19/2014 0942   ALKPHOS 49 01/19/2014 0942   AST 14 01/19/2014 0942   ALT 10 01/19/2014 0942   BILITOT 0.4 01/19/2014 0942         Impression and Plan: Angela Burgess is 73 year old white female with hemachromatosis. We had been phlebotomizing her aggressively. Her MCV still is quite high. I would have to think that her iron  is still not deficient. Her platelet count has not yet gone up. This often is a fairly good indicator for iron deficiency.  We will see what iron studies are. These will determine when we have to get her back in here.  Typically, I will see her in about 6 weeks.   [name], MD 6/5/201512:53 PM

## 2014-02-27 LAB — IRON AND TIBC CHCC
%SAT: 49 % (ref 21–57)
IRON: 114 ug/dL (ref 41–142)
TIBC: 233 ug/dL — ABNORMAL LOW (ref 236–444)
UIBC: 119 ug/dL — ABNORMAL LOW (ref 120–384)

## 2014-02-27 LAB — FERRITIN CHCC: Ferritin: 48 ng/ml (ref 9–269)

## 2014-02-27 NOTE — Addendum Note (Signed)
Addended by: Burney Gauze R on: 02/27/2014 09:08 PM   Modules accepted: Orders

## 2014-02-28 ENCOUNTER — Telehealth: Payer: Self-pay | Admitting: Hematology & Oncology

## 2014-02-28 ENCOUNTER — Telehealth: Payer: Self-pay | Admitting: *Deleted

## 2014-02-28 NOTE — Telephone Encounter (Signed)
Left message with 7-9 appointment

## 2014-02-28 NOTE — Telephone Encounter (Addendum)
Message copied by Lenn Sink on Tue Feb 28, 2014  1:58 PM ------      Message from: Burney Gauze R      Created: Mon Feb 27, 2014  9:07 PM       Call - ferritin is down to 48!!!  This is great!!  No need for phlebotomy!!!  I will see you back in 1 month!!!  Pete ------Left voicemail informing pt that ferritin is down to 48. No need for phlebotomy!!! Will see you back in 1 month

## 2014-03-08 NOTE — Telephone Encounter (Signed)
Left a message on the patient's home phone for her to return my call.

## 2014-03-30 ENCOUNTER — Encounter: Payer: Self-pay | Admitting: Family

## 2014-03-30 ENCOUNTER — Ambulatory Visit (HOSPITAL_BASED_OUTPATIENT_CLINIC_OR_DEPARTMENT_OTHER): Payer: Medicare Other | Admitting: Family

## 2014-03-30 ENCOUNTER — Other Ambulatory Visit (HOSPITAL_BASED_OUTPATIENT_CLINIC_OR_DEPARTMENT_OTHER): Payer: Medicare Other | Admitting: Lab

## 2014-03-30 DIAGNOSIS — R718 Other abnormality of red blood cells: Secondary | ICD-10-CM

## 2014-03-30 LAB — CBC WITH DIFFERENTIAL (CANCER CENTER ONLY)
BASO#: 0 10*3/uL (ref 0.0–0.2)
BASO%: 0.4 % (ref 0.0–2.0)
EOS%: 0.8 % (ref 0.0–7.0)
Eosinophils Absolute: 0 10*3/uL (ref 0.0–0.5)
HEMATOCRIT: 35.2 % (ref 34.8–46.6)
HGB: 12.5 g/dL (ref 11.6–15.9)
LYMPH#: 1.6 10*3/uL (ref 0.9–3.3)
LYMPH%: 30.7 % (ref 14.0–48.0)
MCH: 36.5 pg — AB (ref 26.0–34.0)
MCHC: 35.5 g/dL (ref 32.0–36.0)
MCV: 103 fL — AB (ref 81–101)
MONO#: 0.4 10*3/uL (ref 0.1–0.9)
MONO%: 8.6 % (ref 0.0–13.0)
NEUT#: 3.1 10*3/uL (ref 1.5–6.5)
NEUT%: 59.5 % (ref 39.6–80.0)
PLATELETS: 221 10*3/uL (ref 145–400)
RBC: 3.42 10*6/uL — ABNORMAL LOW (ref 3.70–5.32)
RDW: 10.9 % — AB (ref 11.1–15.7)
WBC: 5.1 10*3/uL (ref 3.9–10.0)

## 2014-03-30 NOTE — Progress Notes (Signed)
[place]  Telephone:([phone]) [phone] Fax:(336) [phone]  ID: WYNNE ROZAK OB: 1940-10-14 MR#: [id number]#:[id number] Patient Care Team: Burnis Medin, MD as PCP - General [place] Dragon, MD (Gastroenterology) Allena Katz, MD as Attending Physician (Obstetrics and Gynecology) Christoper Allegra, MD (Rheumatology) [place]   [place]  DIAGNOSIS: Hemachromatosis (C282Y) --homozygous mutation  INTERVAL HISTORY: Mrs. Gelder is a very pleasant 73 yo female here today with her husband for her 1 month follow-up. She's doing well and state that her energy level is great. She was doing well with phlebotomies, her last was in May. In June her ferritin level was 48 and iron saturation 49%. She is not taking any vitamin C. She is staying active. She denies fever, chills, N/V, headaches, dizziness, blurred vision, SOB, chest pain, palpitations, cough, rash, constipation, diarrhea, problems urinating, blood in urine or stool. She states that her appetite is good. She denies swelling, tenderness, numbness or tingling in her extremities.   CURRENT THERAPY: Phlebotomy to maintain a ferritin below 100   REVIEW OF SYSTEMS: All other 10 point review of systems is negative.   PAST MEDICAL HISTORY: Past Medical History  Diagnosis Date  . Hypothyroidism   . Macrocytosis     with theme eval in past and normal b12  . Rosacea   . Cervical cancer 1968  . History of mammogram 1/10  . Tetanus-diphtheria (Td) vaccination 1999  . Anemia   . History of normal resting EKG 2005  . Dexamethasone adverse reaction 2008  . Eye exam abnormal 6/09  . Arthritis   . IBS (irritable bowel syndrome)   . Gastritis   . Osteopenia   . Hyperlipidemia   . Rectocele   . Elevated MCV     for years  . Family history of malignant neoplasm of gastrointestinal tract    PAST SURGICAL HISTORY: Past Surgical History  Procedure Laterality Date  . Appendectomy    . Abdominal hysterectomy    . Breast  lumpectomy     FAMILY HISTORY Family History  Problem Relation Age of Onset  . Cirrhosis Mother   . Kidney disease Mother   . Allergy (severe) Mother     protein allergy that sent posion to her brain  . Leukemia Father   . Heart attack Father 66  . Diabetes Father   . Bipolar disorder Sister   . Stroke Sister   . Lung cancer Brother     smoker  . Seizures Brother   . Bipolar disorder Son   . Colon cancer Brother   . Seizures Sister   . Seizures Sister    GYNECOLOGIC HISTORY:  No LMP recorded. Patient has had a hysterectomy.   SOCIAL HISTORY: History   Social History  . Marital Status: Married    Spouse Name: N/A    Number of Children: 2  . Years of Education: N/A   Occupational History  . retired    Social History Main Topics  . Smoking status: Never Smoker   . Smokeless tobacco: Never Used     Comment: never used tobacco  . Alcohol Use: Yes     Comment: 2 drinks a week   . Drug Use: No  . Sexual Activity: Not on file   Other Topics Concern  . 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 Smoking status: Never Smoker   . Smokeless tobacco: Never Used     Comment: never used tobacco  . Alcohol Use: Yes     Comment: 2 drinks a week    Colonoscopy: PAP: Bone density: Lipid panel:  Allergies  Allergen Reactions  . Tetracycline    Current Outpatient Prescriptions  Medication Sig Dispense Refill  . aspirin 81 MG tablet Take 81 mg by mouth daily.      . Astaxanthin 4 MG CAPS Take 1 capsule by mouth 2 (two) times daily.      . Calcium Carbonate-Vitamin D (CALCIUM 500/D) 500-125 MG-UNIT TABS Take by mouth.        . Cholecalciferol (VITAMIN D3) 2000 UNITS TABS Take by mouth every morning.      [name] estradiol (ESTRACE) 0.1 MG/GM vaginal cream Place 1 Applicatorful vaginally 2 (two)  times a week.       . fish oil-omega-3 fatty acids 1000 MG capsule Take 2 g by mouth daily.        [name] levothyroxine (SYNTHROID, LEVOTHROID) 112 MCG tablet TAKE 1 TABLET BY MOUTH DAILY  90 tablet  0  . metroNIDAZOLE (METROCREAM) 0.75 % cream Apply 1 application topically daily.        . Multiple Vitamin (MULTIVITAMIN) tablet Take 1 tablet by mouth daily.         No current facility-administered medications for this visit.   OBJECTIVE: Filed Vitals:   03/30/14 1541  BP: 123/70  Pulse: 70  Temp: 97.8 F (36.6 C)  Resp: 14   Body mass index is 22.47 kg/(m^2). ECOG FS:0 - Asymptomatic Ocular: Sclerae unicteric, pupils equal, round and reactive to light Ear-nose-throat: Oropharynx clear, dentition fair Lymphatic: No cervical or supraclavicular adenopathy Lungs no rales or rhonchi, good excursion bilaterally Heart regular rate and rhythm, no murmur appreciated Abd soft, nontender, positive bowel sounds MSK no focal spinal tenderness, no joint edema Neuro: non-focal, well-oriented, appropriate affect Breasts: Deferred  LAB RESULTS: CMP     Component Value Date/Time   NA 141 01/19/2014 0942   K 3.9 01/19/2014 0942   CL 102 01/19/2014 0942   CO2 30 01/19/2014 0942   GLUCOSE 87 01/19/2014 0942   BUN 15 01/19/2014 0942   CREATININE 0.71 01/19/2014 0942   CALCIUM 9.7 01/19/2014 0942   PROT 6.4 01/19/2014 0942   ALBUMIN 4.2 01/19/2014 0942   AST 14 01/19/2014 0942   ALT 10 01/19/2014 0942   ALKPHOS 49 01/19/2014 0942   BILITOT 0.4 01/19/2014 0942   GFRNONAA 88.11 11/13/2009 0954   GFRAA 107 10/24/2008 0000   No results found for this basename: SPEP, UPEP,  kappa and lambda light chains   Lab Results  Component Value Date   WBC 5.1 03/30/2014   NEUTROABS 3.1 03/30/2014   HGB 12.5 03/30/2014   HCT 35.2 03/30/2014   MCV 103* 03/30/2014   PLT 221 03/30/2014   No results found for this basename: LABCA2   No components found with this basename: OACZY606   No results found for this basename: INR,  in the  last 168 hours  STUDIES: No results found.  ASSESSMENT/PLAN: Ms. Ismael is a pleasant 73 year old white female with hemachromatosis. We had been phlebotomizing her aggressively, her last was in May. Her MCV today still is slightly improved. Her Hct is 35.2 and platelets 221. We are still waiting for her iron and ferritin studies and will call her with the  results. We will decide at that time whether or not she needs phlebotomized.   We will call her with her lab results and schedule her follow-up appointment at that time.   All questions were answered. The plan was discussed with she and her husband and they are in agreement. She knows to call here with any questions or concerns and to go to the ED in the event of an emergency. We can certainly see her sooner if need be.  Eliezer Bottom, NP 03/30/2014 4:48 PM

## 2014-03-31 LAB — IRON AND TIBC CHCC
%SAT: 49 % (ref 21–57)
IRON: 112 ug/dL (ref 41–142)
TIBC: 226 ug/dL — ABNORMAL LOW (ref 236–444)
UIBC: 115 ug/dL — ABNORMAL LOW (ref 120–384)

## 2014-03-31 LAB — FERRITIN CHCC: Ferritin: 63 ng/ml (ref 9–269)

## 2014-03-31 NOTE — Addendum Note (Signed)
Addended by: Eliezer Bottom on: 03/31/2014 02:51 PM   Modules accepted: Orders

## 2014-04-05 ENCOUNTER — Telehealth: Payer: Self-pay | Admitting: *Deleted

## 2014-04-05 NOTE — Telephone Encounter (Signed)
Informed pt that ferritin was 63 and she does not need a phlebotomy. Set pt up with scheduler.

## 2014-04-18 NOTE — Telephone Encounter (Signed)
Letter sent by mail

## 2014-04-25 ENCOUNTER — Telehealth: Payer: Self-pay | Admitting: Internal Medicine

## 2014-04-25 NOTE — Telephone Encounter (Signed)
Please see if the pt is willing to have lab work when she has lab work done for [name], NP in Aug.  If so, please let me know.

## 2014-04-25 NOTE — Telephone Encounter (Signed)
Pt received letter from you about sch tsh labs,  Pt has cpe in dec and request to wait until then when she has cpe labs,

## 2014-04-26 NOTE — Telephone Encounter (Signed)
Pt states that will be fine! She will get those labs that day w. NP in Aug.

## 2014-04-26 NOTE — Telephone Encounter (Signed)
Pt would like lipid also added to order for dr Marin Olp

## 2014-04-27 NOTE — Telephone Encounter (Signed)
Dr. Antonieta Pert office will have to put in the order.  I cannot order for his office.

## 2014-05-11 ENCOUNTER — Encounter: Payer: Self-pay | Admitting: Hematology & Oncology

## 2014-05-11 ENCOUNTER — Other Ambulatory Visit (HOSPITAL_BASED_OUTPATIENT_CLINIC_OR_DEPARTMENT_OTHER): Payer: Medicare Other | Admitting: Lab

## 2014-05-11 ENCOUNTER — Ambulatory Visit (HOSPITAL_BASED_OUTPATIENT_CLINIC_OR_DEPARTMENT_OTHER): Payer: Medicare Other | Admitting: Hematology & Oncology

## 2014-05-11 LAB — CBC WITH DIFFERENTIAL (CANCER CENTER ONLY)
BASO#: 0 10*3/uL (ref 0.0–0.2)
BASO%: 0.4 % (ref 0.0–2.0)
EOS%: 0.8 % (ref 0.0–7.0)
Eosinophils Absolute: 0 10*3/uL (ref 0.0–0.5)
HEMATOCRIT: 35.3 % (ref 34.8–46.6)
HGB: 12.4 g/dL (ref 11.6–15.9)
LYMPH#: 1.6 10*3/uL (ref 0.9–3.3)
LYMPH%: 32.1 % (ref 14.0–48.0)
MCH: 35.6 pg — ABNORMAL HIGH (ref 26.0–34.0)
MCHC: 35.1 g/dL (ref 32.0–36.0)
MCV: 101 fL (ref 81–101)
MONO#: 0.4 10*3/uL (ref 0.1–0.9)
MONO%: 7.2 % (ref 0.0–13.0)
NEUT#: 3 10*3/uL (ref 1.5–6.5)
NEUT%: 59.5 % (ref 39.6–80.0)
Platelets: 199 10*3/uL (ref 145–400)
RBC: 3.48 10*6/uL — AB (ref 3.70–5.32)
RDW: 10.9 % — ABNORMAL LOW (ref 11.1–15.7)
WBC: 5 10*3/uL (ref 3.9–10.0)

## 2014-05-12 LAB — IRON AND TIBC CHCC
%SAT: 81 % — AB (ref 21–57)
IRON: 170 ug/dL — AB (ref 41–142)
TIBC: 210 ug/dL — ABNORMAL LOW (ref 236–444)
UIBC: 40 ug/dL — AB (ref 120–384)

## 2014-05-12 LAB — FERRITIN CHCC: Ferritin: 76 ng/ml (ref 9–269)

## 2014-05-12 NOTE — Progress Notes (Signed)
Hematology and Oncology Follow Up Visit  Angela Burgess [id number] 10/13/1940 73 y.o. 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  Burgess is back for followup. She really looks great. She and her husband have been having a great summer. She has not been transfused for a couple months. We last saw her in July, her ferritin was only 63.  She's had no complaints. There's been no joint problems. She's had no cough at present no fever. Said no rashes. His been no change in bowel or bladder habits.  Medications: Current outpatient prescriptions:aspirin 81 MG tablet, Take 81 mg by mouth daily., Disp: , Rfl: ;  Astaxanthin 4 MG CAPS, Take 1 capsule by mouth 2 (two) times daily., Disp: , Rfl: ;  Calcium Carbonate-Vitamin D (CALCIUM 500/D) 500-125 MG-UNIT TABS, Take by mouth.  , Disp: , Rfl: ;  Cholecalciferol (VITAMIN D3) 2000 UNITS TABS, Take by mouth every morning., Disp: , Rfl:  estradiol (ESTRACE) 0.1 MG/GM vaginal cream, Place 1 Applicatorful vaginally 2 (two) times a week. , Disp: , Rfl: ;  fish oil-omega-3 fatty acids 1000 MG capsule, Take 2 g by mouth daily.  , Disp: , Rfl: ;  levothyroxine (SYNTHROID, LEVOTHROID) 112 MCG tablet, TAKE 1 TABLET BY MOUTH DAILY, Disp: 90 tablet, Rfl: 0;  metroNIDAZOLE (METROCREAM) 0.75 % cream, Apply 1 application topically daily.  , Disp: , Rfl:  Multiple Vitamin (MULTIVITAMIN) tablet, Take 1 tablet by mouth daily.  , Disp: , Rfl:   Allergies:  Allergies  Allergen Reactions  . Tetracycline     Past Medical History, Surgical history, Social history, and Family History were reviewed and updated.  Review of Systems: As above  Physical Exam:  height is 5\' 4"  (1.626 m) and weight is 131 lb (59.421 kg). Her oral temperature is 97.6 F (36.4 C). Her blood pressure is 128/46 and her pulse is 53. Her respiration is 14.    Well-developed and well-nourished white female. Head and exam shows no ocular or oral lesions. She is no palpable cervical or supraclavicular lymph nodes. Lungs are clear. Cardiac exam regular rate rhythm. Abdomen soft. Shows good bowel sounds. There is no fluid wave. There is no palpable liver or spleen. Extremities shows no clubbing cyanosis or edema. Skin exam no rashes. Neurological exam no focal deficits.  Lab Results  Component Value Date   WBC 5.0 05/11/2014   HGB 12.4 05/11/2014   HCT 35.3 05/11/2014   MCV 101 05/11/2014   PLT 199 05/11/2014     Chemistry      Component Value Date/Time   NA 141 01/19/2014 0942   K 3.9 01/19/2014 0942   CL 102 01/19/2014 0942   CO2 30 01/19/2014 0942   BUN 15 01/19/2014 0942   CREATININE 0.71 01/19/2014 0942      Component Value Date/Time   CALCIUM 9.7 01/19/2014 0942   ALKPHOS 49 01/19/2014 0942   AST 14 01/19/2014 0942   ALT 10 01/19/2014 0942   BILITOT 0.4 01/19/2014 0942         Impression and Plan: Angela Burgess is 73 year old female with hemachromatosis. She is homozygous for the major mutation.  She has doing well with phlebotomies. We have not phlebotomize her now for several months.  We will see what her ferritin level is. This will determine when she comes back to see [place].  Hopefully, we  will probably get her back in about 6-8 weeks.  She and her husband like to travel. I don't see any restrictions that she needs.   [name], MD 8/21/20156:24 AM

## 2014-05-23 ENCOUNTER — Encounter: Payer: Self-pay | Admitting: *Deleted

## 2014-05-23 NOTE — Progress Notes (Unsigned)
Pt called asking when her next appt was. No appt has been scheduled. Dr. Marin Olp off today.  Will ask him tomorrow and call pt back.

## 2014-05-25 ENCOUNTER — Encounter: Payer: Self-pay | Admitting: Internal Medicine

## 2014-05-26 ENCOUNTER — Other Ambulatory Visit: Payer: Self-pay | Admitting: Internal Medicine

## 2014-05-26 NOTE — Telephone Encounter (Signed)
Sent to the pharmacy by e-scribe. Pt has upcoming CPE in Dec. 2015.

## 2014-06-06 ENCOUNTER — Encounter: Payer: Self-pay | Admitting: *Deleted

## 2014-06-06 NOTE — Progress Notes (Signed)
Patient needs lab appt the week of Oct 12 to check her CBC

## 2014-06-27 ENCOUNTER — Encounter: Payer: Self-pay | Admitting: Internal Medicine

## 2014-07-03 ENCOUNTER — Other Ambulatory Visit: Payer: Self-pay

## 2014-07-04 ENCOUNTER — Other Ambulatory Visit (HOSPITAL_BASED_OUTPATIENT_CLINIC_OR_DEPARTMENT_OTHER): Payer: Medicare Other | Admitting: Lab

## 2014-07-04 LAB — FERRITIN CHCC: Ferritin: 88 ng/ml (ref 9–269)

## 2014-07-04 LAB — CBC WITH DIFFERENTIAL (CANCER CENTER ONLY)
BASO#: 0 10*3/uL (ref 0.0–0.2)
BASO%: 0.5 % (ref 0.0–2.0)
EOS%: 1.5 % (ref 0.0–7.0)
Eosinophils Absolute: 0.1 10*3/uL (ref 0.0–0.5)
HCT: 33.8 % — ABNORMAL LOW (ref 34.8–46.6)
HEMOGLOBIN: 11.9 g/dL (ref 11.6–15.9)
LYMPH#: 1.3 10*3/uL (ref 0.9–3.3)
LYMPH%: 32.1 % (ref 14.0–48.0)
MCH: 36.5 pg — ABNORMAL HIGH (ref 26.0–34.0)
MCHC: 35.2 g/dL (ref 32.0–36.0)
MCV: 104 fL — ABNORMAL HIGH (ref 81–101)
MONO#: 0.4 10*3/uL (ref 0.1–0.9)
MONO%: 8.6 % (ref 0.0–13.0)
NEUT#: 2.3 10*3/uL (ref 1.5–6.5)
NEUT%: 57.3 % (ref 39.6–80.0)
Platelets: 197 10*3/uL (ref 145–400)
RBC: 3.26 10*6/uL — ABNORMAL LOW (ref 3.70–5.32)
RDW: 11.5 % (ref 11.1–15.7)
WBC: 4.1 10*3/uL (ref 3.9–10.0)

## 2014-07-04 LAB — IRON AND TIBC CHCC
%SAT: 83 % — AB (ref 21–57)
Iron: 159 ug/dL — ABNORMAL HIGH (ref 41–142)
TIBC: 193 ug/dL — AB (ref 236–444)
UIBC: 33 ug/dL — AB (ref 120–384)

## 2014-07-05 ENCOUNTER — Encounter: Payer: Self-pay | Admitting: *Deleted

## 2014-07-07 ENCOUNTER — Encounter: Payer: Self-pay | Admitting: Sports Medicine

## 2014-07-07 ENCOUNTER — Ambulatory Visit (INDEPENDENT_AMBULATORY_CARE_PROVIDER_SITE_OTHER): Payer: Medicare Other | Admitting: Sports Medicine

## 2014-07-07 ENCOUNTER — Ambulatory Visit (INDEPENDENT_AMBULATORY_CARE_PROVIDER_SITE_OTHER): Payer: Medicare Other

## 2014-07-07 ENCOUNTER — Other Ambulatory Visit: Payer: Self-pay

## 2014-07-07 VITALS — BP 105/50 | HR 70 | Ht 64.0 in | Wt 131.0 lb

## 2014-07-07 DIAGNOSIS — M5416 Radiculopathy, lumbar region: Secondary | ICD-10-CM

## 2014-07-07 DIAGNOSIS — M47816 Spondylosis without myelopathy or radiculopathy, lumbar region: Secondary | ICD-10-CM | POA: Insufficient documentation

## 2014-07-07 DIAGNOSIS — M4316 Spondylolisthesis, lumbar region: Secondary | ICD-10-CM

## 2014-07-07 DIAGNOSIS — M4697 Unspecified inflammatory spondylopathy, lumbosacral region: Secondary | ICD-10-CM

## 2014-07-07 DIAGNOSIS — M5417 Radiculopathy, lumbosacral region: Secondary | ICD-10-CM

## 2014-07-07 MED ORDER — PREDNISONE 50 MG PO TABS: ORAL_TABLET | ORAL | Status: DC

## 2014-07-07 MED ORDER — MELOXICAM 15 MG PO TABS: ORAL_TABLET | ORAL | Status: DC

## 2014-07-07 NOTE — Assessment & Plan Note (Signed)
Formal physical therapy, prednisone, Mobic. X-rays.  Return in 4 weeks, MRI for interventional injection planning if no better.

## 2014-07-07 NOTE — Progress Notes (Signed)
   Subjective:    I'm seeing this patient as a consultation for:  Dr. Shanon Ace  CC: Right leg pain  HPI: This is a very pleasant 73 year old female with a history of right hip osteoarthritis, and hemochromatosis who comes in with a several month history of pain that she localizes on the anterior aspect of her right lower leg. She does endorse the pain starts in her back and right lateral hip, wraps around the lateral thigh, to the anterior lower leg. She describes it as a sensation of achiness, with possibly some numbness or tendon. It is worse with sitting, flexion, and Valsalva. No bowel or bladder dysfunction that is new, no constitutional symptoms.  Past medical history, Surgical history, Family history not pertinant except as noted below, Social history, Allergies, and medications have been entered into the medical record, reviewed, and no changes needed.   Review of Systems: No headache, visual changes, nausea, vomiting, diarrhea, constipation, dizziness, abdominal pain, skin rash, fevers, chills, night sweats, weight loss, swollen lymph nodes, body aches, joint swelling, muscle aches, chest pain, shortness of breath, mood changes, visual or auditory hallucinations.   Objective:   General: Well Developed, well nourished, and in no acute distress.  Neuro/Psych: Alert and oriented x3, extra-ocular muscles intact, able to move all 4 extremities, sensation grossly intact. Skin: Warm and dry, no rashes noted.  Respiratory: Not using accessory muscles, speaking in full sentences, trachea midline.  Cardiovascular: Pulses palpable, no extremity edema. Abdomen: Does not appear distended. Right Hip: ROM IR: 10 Deg, ER: 40 Deg, Flexion: 120 Deg, Extension: 100 Deg, Abduction: 45 Deg, Adduction: 41 Deg, internal rotation of the hip does not cause concordant pain. Strength IR: 5/5, ER: 5/5, Flexion: 5/5, Extension: 5/5, Abduction: 5/5, Adduction: 5/5 Pelvic alignment unremarkable to inspection  and palpation. Standing hip rotation and gait without trendelenburg / unsteadiness. Greater trochanter without tenderness to palpation. No tenderness over piriformis. No SI joint tenderness and normal minimal SI movement. Back Exam:  Inspection: Unremarkable  Motion: Flexion 45 deg, Extension 45 deg, Side Bending to 45 deg bilaterally,  Rotation to 45 deg bilaterally  SLR laying: Positive straight leg raise on the right side with reproduction of her right-sided L5 radicular symptoms. XSLR laying: Negative  Palpable tenderness: None. FABER: negative. Sensory change: Gross sensation intact to all lumbar and sacral dermatomes.  Reflexes: 2+ at both patellar tendons, 2+ at achilles tendons, Babinski's downgoing.  Strength at foot  Plantar-flexion: 5/5 Dorsi-flexion: 5/5 Eversion: 5/5 Inversion: 5/5  Leg strength  Quad: 5/5 Hamstring: 5/5 Hip flexor: 5/5 Hip abductors: 5/5  Gait unremarkable.  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  Impression and Recommendations:   This case required medical decision making of moderate complexity.

## 2014-07-19 ENCOUNTER — Ambulatory Visit (INDEPENDENT_AMBULATORY_CARE_PROVIDER_SITE_OTHER): Payer: Medicare Other | Admitting: Physical Therapy

## 2014-07-19 DIAGNOSIS — M25659 Stiffness of unspecified hip, not elsewhere classified: Secondary | ICD-10-CM

## 2014-07-19 DIAGNOSIS — M545 Low back pain: Secondary | ICD-10-CM

## 2014-07-19 DIAGNOSIS — M5417 Radiculopathy, lumbosacral region: Secondary | ICD-10-CM

## 2014-07-21 ENCOUNTER — Encounter (INDEPENDENT_AMBULATORY_CARE_PROVIDER_SITE_OTHER): Payer: Medicare Other | Admitting: Physical Therapy

## 2014-07-21 DIAGNOSIS — M545 Low back pain: Secondary | ICD-10-CM

## 2014-07-21 DIAGNOSIS — M25659 Stiffness of unspecified hip, not elsewhere classified: Secondary | ICD-10-CM

## 2014-07-21 DIAGNOSIS — M5417 Radiculopathy, lumbosacral region: Secondary | ICD-10-CM

## 2014-07-25 ENCOUNTER — Encounter (INDEPENDENT_AMBULATORY_CARE_PROVIDER_SITE_OTHER): Payer: Medicare Other | Admitting: Physical Therapy

## 2014-07-25 DIAGNOSIS — M545 Low back pain: Secondary | ICD-10-CM

## 2014-07-25 DIAGNOSIS — M25659 Stiffness of unspecified hip, not elsewhere classified: Secondary | ICD-10-CM

## 2014-07-25 DIAGNOSIS — M5417 Radiculopathy, lumbosacral region: Secondary | ICD-10-CM

## 2014-07-27 ENCOUNTER — Encounter (INDEPENDENT_AMBULATORY_CARE_PROVIDER_SITE_OTHER): Payer: Medicare Other | Admitting: Physical Therapy

## 2014-07-27 DIAGNOSIS — M545 Low back pain: Secondary | ICD-10-CM

## 2014-07-27 DIAGNOSIS — M5417 Radiculopathy, lumbosacral region: Secondary | ICD-10-CM

## 2014-07-27 DIAGNOSIS — M25659 Stiffness of unspecified hip, not elsewhere classified: Secondary | ICD-10-CM

## 2014-08-01 ENCOUNTER — Encounter (INDEPENDENT_AMBULATORY_CARE_PROVIDER_SITE_OTHER): Payer: Medicare Other | Admitting: Physical Therapy

## 2014-08-01 DIAGNOSIS — M545 Low back pain: Secondary | ICD-10-CM

## 2014-08-01 DIAGNOSIS — M25659 Stiffness of unspecified hip, not elsewhere classified: Secondary | ICD-10-CM

## 2014-08-01 DIAGNOSIS — M5417 Radiculopathy, lumbosacral region: Secondary | ICD-10-CM

## 2014-08-03 ENCOUNTER — Encounter (INDEPENDENT_AMBULATORY_CARE_PROVIDER_SITE_OTHER): Payer: Medicare Other | Admitting: Physical Therapy

## 2014-08-03 DIAGNOSIS — M545 Low back pain: Secondary | ICD-10-CM

## 2014-08-03 DIAGNOSIS — M25659 Stiffness of unspecified hip, not elsewhere classified: Secondary | ICD-10-CM

## 2014-08-03 DIAGNOSIS — M5417 Radiculopathy, lumbosacral region: Secondary | ICD-10-CM

## 2014-08-07 ENCOUNTER — Ambulatory Visit (INDEPENDENT_AMBULATORY_CARE_PROVIDER_SITE_OTHER): Payer: Medicare Other | Admitting: Sports Medicine

## 2014-08-07 ENCOUNTER — Encounter: Payer: Self-pay | Admitting: Sports Medicine

## 2014-08-07 ENCOUNTER — Encounter (INDEPENDENT_AMBULATORY_CARE_PROVIDER_SITE_OTHER): Payer: Medicare Other | Admitting: Physical Therapy

## 2014-08-07 VITALS — BP 131/68 | HR 79 | Ht 64.0 in | Wt 129.0 lb

## 2014-08-07 DIAGNOSIS — J310 Chronic rhinitis: Secondary | ICD-10-CM

## 2014-08-07 DIAGNOSIS — M545 Low back pain: Secondary | ICD-10-CM

## 2014-08-07 DIAGNOSIS — M5417 Radiculopathy, lumbosacral region: Secondary | ICD-10-CM

## 2014-08-07 DIAGNOSIS — M5416 Radiculopathy, lumbar region: Secondary | ICD-10-CM

## 2014-08-07 DIAGNOSIS — M25659 Stiffness of unspecified hip, not elsewhere classified: Secondary | ICD-10-CM

## 2014-08-07 DIAGNOSIS — M1611 Unilateral primary osteoarthritis, right hip: Secondary | ICD-10-CM

## 2014-08-07 MED ORDER — MELOXICAM 15 MG PO TABS: ORAL_TABLET | ORAL | Status: DC

## 2014-08-07 MED ORDER — FLUTICASONE PROPIONATE 50 MCG/ACT NA SUSP: NASAL | Status: DC

## 2014-08-07 NOTE — Assessment & Plan Note (Signed)
With postnasal drip syndrome. Adding Flonase.

## 2014-08-07 NOTE — Assessment & Plan Note (Signed)
We will focus more on rehabilitation of the hip girdle muscles. Return in one month, switching to Mobic. If no response, we can consider femoral acetabular injection.

## 2014-08-07 NOTE — Progress Notes (Signed)
  Subjective:    CC: follow-up  HPI: Lumbar radiculopathy: Improved significantly with physical therapy and with NSAIDs. Happy with results so far.  Right groin pain: Improved significantly but still with some pain, she does not desire any aggressive or interventional treatment, she does have known hip osteoarthritis. Pain is mild, improving.  Past medical history, Surgical history, Family history not pertinant except as noted below, Social history, Allergies, and medications have been entered into the medical record, reviewed, and no changes needed.   Review of Systems: No fevers, chills, night sweats, weight loss, chest pain, or shortness of breath.   Objective:    General: Well Developed, well nourished, and in no acute distress.  Neuro: Alert and oriented x3, extra-ocular muscles intact, sensation grossly intact.  HEENT: Normocephalic, atraumatic, pupils equal round reactive to light, neck supple, no masses, no lymphadenopathy, thyroid nonpalpable.  Skin: Warm and dry, no rashes. Cardiac: Regular rate and rhythm, no murmurs rubs or gallops, no lower extremity edema.  Respiratory: Clear to auscultation bilaterally. Not using accessory muscles, speaking in full sentences. Right Hip: ROM IR: 5 Deg, and reproduces pain, ER: 60 Deg, Flexion: 120 Deg, Extension: 100 Deg, Abduction: 45 Deg, Adduction: 45 Deg Strength IR: 5/5, ER: 5/5, Flexion: 5/5, Extension: 5/5, Abduction: 5/5, Adduction: 5/5 Pelvic alignment unremarkable to inspection and palpation. Standing hip rotation and gait without trendelenburg / unsteadiness. Greater trochanter without tenderness to palpation. No tenderness over piriformis. No SI joint tenderness and normal minimal SI movement.  Impression and Recommendations:

## 2014-08-07 NOTE — Assessment & Plan Note (Signed)
Significantly better with formal physical therapy. Desires to do more therapy.

## 2014-08-14 ENCOUNTER — Other Ambulatory Visit: Payer: Self-pay | Admitting: Internal Medicine

## 2014-08-14 NOTE — Telephone Encounter (Signed)
Has had recent TSH

## 2014-08-15 NOTE — Telephone Encounter (Signed)
[place] for 90 days  Has labs coming up soon

## 2014-08-16 NOTE — Telephone Encounter (Signed)
Sent to the pharmacy by e-scribe. 

## 2014-08-22 ENCOUNTER — Encounter (INDEPENDENT_AMBULATORY_CARE_PROVIDER_SITE_OTHER): Payer: Medicare Other | Admitting: Physical Therapy

## 2014-08-22 DIAGNOSIS — M545 Low back pain: Secondary | ICD-10-CM

## 2014-08-22 DIAGNOSIS — M5417 Radiculopathy, lumbosacral region: Secondary | ICD-10-CM

## 2014-08-22 DIAGNOSIS — M25659 Stiffness of unspecified hip, not elsewhere classified: Secondary | ICD-10-CM

## 2014-08-29 ENCOUNTER — Encounter (INDEPENDENT_AMBULATORY_CARE_PROVIDER_SITE_OTHER): Payer: Medicare Other | Admitting: Physical Therapy

## 2014-08-29 DIAGNOSIS — M545 Low back pain: Secondary | ICD-10-CM

## 2014-08-29 DIAGNOSIS — M25659 Stiffness of unspecified hip, not elsewhere classified: Secondary | ICD-10-CM

## 2014-08-29 DIAGNOSIS — M5417 Radiculopathy, lumbosacral region: Secondary | ICD-10-CM

## 2014-09-01 ENCOUNTER — Other Ambulatory Visit (INDEPENDENT_AMBULATORY_CARE_PROVIDER_SITE_OTHER): Payer: Medicare Other

## 2014-09-01 DIAGNOSIS — E039 Hypothyroidism, unspecified: Secondary | ICD-10-CM

## 2014-09-01 DIAGNOSIS — Z0000 Encounter for general adult medical examination without abnormal findings: Secondary | ICD-10-CM

## 2014-09-01 DIAGNOSIS — I10 Essential (primary) hypertension: Secondary | ICD-10-CM

## 2014-09-01 LAB — CBC WITH DIFFERENTIAL/PLATELET
Basophils Absolute: 0 10*3/uL (ref 0.0–0.1)
Basophils Relative: 1 % (ref 0.0–3.0)
EOS PCT: 1.6 % (ref 0.0–5.0)
Eosinophils Absolute: 0.1 10*3/uL (ref 0.0–0.7)
HEMATOCRIT: 36.6 % (ref 36.0–46.0)
HEMOGLOBIN: 12.3 g/dL (ref 12.0–15.0)
Lymphocytes Relative: 35.2 % (ref 12.0–46.0)
Lymphs Abs: 1.5 10*3/uL (ref 0.7–4.0)
MCHC: 33.7 g/dL (ref 30.0–36.0)
MCV: 106 fl — AB (ref 78.0–100.0)
MONO ABS: 0.3 10*3/uL (ref 0.1–1.0)
Monocytes Relative: 6.9 % (ref 3.0–12.0)
NEUTROS ABS: 2.3 10*3/uL (ref 1.4–7.7)
NEUTROS PCT: 55.3 % (ref 43.0–77.0)
Platelets: 217 10*3/uL (ref 150.0–400.0)
RBC: 3.45 Mil/uL — AB (ref 3.87–5.11)
RDW: 11.8 % (ref 11.5–15.5)
WBC: 4.2 10*3/uL (ref 4.0–10.5)

## 2014-09-01 LAB — LIPID PANEL
CHOL/HDL RATIO: 4
Cholesterol: 241 mg/dL — ABNORMAL HIGH (ref 0–200)
HDL: 59.4 mg/dL (ref >39.00)
LDL Cholesterol: 163 mg/dL — ABNORMAL HIGH (ref 0–99)
NonHDL: 181.6
Triglycerides: 92 mg/dL (ref 0.0–149.0)
VLDL: 18.4 mg/dL (ref 0.0–40.0)

## 2014-09-01 LAB — BASIC METABOLIC PANEL
BUN: 16 mg/dL (ref 6–23)
CHLORIDE: 104 meq/L (ref 96–112)
CO2: 28 meq/L (ref 19–32)
CREATININE: 0.8 mg/dL (ref 0.4–1.2)
Calcium: 9.3 mg/dL (ref 8.4–10.5)
GFR: 80.27 mL/min (ref >60.00)
Glucose, Bld: 90 mg/dL (ref 70–99)
POTASSIUM: 3.9 meq/L (ref 3.5–5.1)
Sodium: 136 mEq/L (ref 135–145)

## 2014-09-01 LAB — HEPATIC FUNCTION PANEL
ALBUMIN: 3.9 g/dL (ref 3.5–5.2)
ALT: 15 U/L (ref 0–35)
AST: 17 U/L (ref 0–37)
Alkaline Phosphatase: 50 U/L (ref 39–117)
BILIRUBIN DIRECT: 0 mg/dL (ref 0.0–0.3)
Total Bilirubin: 0.6 mg/dL (ref 0.2–1.2)
Total Protein: 6.6 g/dL (ref 6.0–8.3)

## 2014-09-01 LAB — TSH: TSH: 1.41 u[IU]/mL (ref 0.35–4.50)

## 2014-09-05 ENCOUNTER — Encounter: Payer: Self-pay | Admitting: Internal Medicine

## 2014-09-05 ENCOUNTER — Ambulatory Visit (INDEPENDENT_AMBULATORY_CARE_PROVIDER_SITE_OTHER): Payer: Medicare Other | Admitting: Internal Medicine

## 2014-09-05 ENCOUNTER — Encounter (INDEPENDENT_AMBULATORY_CARE_PROVIDER_SITE_OTHER): Payer: Medicare Other | Admitting: Physical Therapy

## 2014-09-05 VITALS — BP 130/60 | Temp 98.0°F | Ht 64.0 in | Wt 130.6 lb

## 2014-09-05 DIAGNOSIS — M5417 Radiculopathy, lumbosacral region: Secondary | ICD-10-CM | POA: Diagnosis not present

## 2014-09-05 DIAGNOSIS — E039 Hypothyroidism, unspecified: Secondary | ICD-10-CM

## 2014-09-05 DIAGNOSIS — M545 Low back pain: Secondary | ICD-10-CM | POA: Diagnosis not present

## 2014-09-05 DIAGNOSIS — M150 Primary generalized (osteo)arthritis: Secondary | ICD-10-CM

## 2014-09-05 DIAGNOSIS — M159 Polyosteoarthritis, unspecified: Secondary | ICD-10-CM

## 2014-09-05 DIAGNOSIS — M25659 Stiffness of unspecified hip, not elsewhere classified: Secondary | ICD-10-CM | POA: Diagnosis not present

## 2014-09-05 DIAGNOSIS — Z0000 Encounter for general adult medical examination without abnormal findings: Secondary | ICD-10-CM

## 2014-09-05 DIAGNOSIS — B353 Tinea pedis: Secondary | ICD-10-CM

## 2014-09-05 DIAGNOSIS — E785 Hyperlipidemia, unspecified: Secondary | ICD-10-CM

## 2014-09-05 MED ORDER — LEVOTHYROXINE SODIUM 112 MCG PO TABS: 112.0000 ug | ORAL_TABLET | Freq: Every day | ORAL | Status: DC

## 2014-09-05 NOTE — Progress Notes (Signed)
Pre visit review using our clinic review tool, if applicable. No additional management support is needed unless otherwise documented below in the visit note.  Chief Complaint  Patient presents with  . Medicare Wellness  . Hypothyroidism    HPI: Patient comes in today for Preventive Medicare wellness visit . And Chronic disease management  Due for dental.  Joint pain arthritis  Seen 2 x per year  Angela Burgess  Dr T for derm check Sees [facility] for hemochromatosis and elevated mcv .  Taking thyroid med no issues Check area new scaley on foot  Itch bump.  Right  Also nail change ? From trauma left toe middle  Health Maintenance  Topic Date Due  . INFLUENZA VACCINE  04/23/2015  . MAMMOGRAM  04/22/2016  . TETANUS/TDAP  11/14/2019  . COLONOSCOPY  07/18/2020  . PNEUMOCOCCAL POLYSACCHARIDE VACCINE AGE 34 AND OVER  Completed  . ZOSTAVAX  Completed   Health Maintenance Review LIFESTYLE:  Exercise:   Pt  Slipped disc  Walking  Tobacco/ETS: no Alcohol: no Sugar beverages: no sugar plan  Sleep:  About 8- 10  Drug use: no Bone density:  Last summer tomblin  Colonoscopy: due 2021   Hearing: ok b  Vision:  No limitations at present . Last eye check UTD  Safety:  Has smoke detector and wears seat belts.  No firearms. No excess sun exposure. Sees dentist regularly.  Falls: no   Advance directive :  Reviewed  Has one.  Memory: Felt to be good  , no concern from her or her family.  Depression: No anhedonia unusual crying or depressive symptoms  Nutrition: Eats well balanced diet; adequate calcium and vitamin D. No swallowing chewing problems.  Injury: no major injuries in the last six months.  Other healthcare providers:  Reviewed today .  Social:  Lives with spouse married. No pets.   Preventive parameters: up-to-date  Reviewed   ADLS:   There are no problems or need for assistance  driving, feeding, obtaining food, dressing, toileting and bathing,  managing money using phone. She is independent.  ROS:  GEN/ HEENT: No fever, significant weight changes sweats headaches vision problems hearing changes, CV/ PULM; No chest pain shortness of breath cough, syncope,edema  change in exercise tolerance. GI /GU: No adominal pain, vomiting, change in bowel habits. No blood in the stool. No significant GU symptoms. SKIN/HEME: ,no acute skin rashes suspicious lesions or bleeding. No lymphadenopathy, nodules, masses. SEE above  NEURO/ PSYCH:  No neurologic signs such as weakness numbness. No depression anxiety. IMM/ Allergy: No unusual infections.  Allergy .   REST of 12 system review negative except as per HPI   Past Medical History  Diagnosis Date  . Hypothyroidism   . Macrocytosis     with theme eval in past and normal b12  . Rosacea   . Cervical cancer 1968  . History of mammogram 1/10  . Tetanus-diphtheria (Td) vaccination 1999  . Anemia   . History of normal resting EKG 2005  . Dexamethasone adverse reaction 2008  . Eye exam abnormal 6/09  . Arthritis   . IBS (irritable bowel syndrome)   . Gastritis   . Osteopenia   . Hyperlipidemia   . Rectocele   . Elevated MCV     for years  . Family history of malignant neoplasm of gastrointestinal tract     Family History  Problem Relation Age of Onset  . Cirrhosis Mother   . Kidney disease  Mother   . Allergy (severe) Mother     protein allergy that sent posion to her brain  . Leukemia Father   . Heart attack Father 8  . Diabetes Father   . Bipolar disorder Sister   . Stroke Sister   . Lung cancer Brother     smoker  . Seizures Brother   . Bipolar disorder Son   . Colon cancer Brother   . Seizures Sister   . Seizures Sister     History   Social History  . Marital Status: Married    Spouse Name: N/A    Number of Children: 2  . Years of Education: N/A   Occupational History  . retired    Social History Main Topics  . Smoking status: Never Smoker   . Smokeless  tobacco: Never Used     Comment: never used tobacco  . Alcohol Use: Yes     Comment: 2 drinks a week   . Drug Use: No  . Sexual Activity: None   Other Topics Concern  . 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 aspirin 81 MG tablet Take 81 mg by mouth daily.  . Astaxanthin 4 MG CAPS Take 1 capsule by mouth 2 (two) times daily.  . Calcium Carbonate-Vitamin D (CALCIUM 500/D) 500-125 MG-UNIT TABS Take by mouth.    . Cholecalciferol (VITAMIN D3) 2000 UNITS TABS Take by mouth every morning.  [name] estradiol (ESTRACE) 0.1 MG/GM vaginal cream Place 1 Applicatorful vaginally 2 (two) times a week.   . fish oil-omega-3 fatty acids 1000 MG capsule Take 2 g by mouth daily.    [name] levothyroxine (SYNTHROID, LEVOTHROID) 112 MCG tablet Take 1 tablet (112 mcg total) by mouth daily.  . metroNIDAZOLE (METROCREAM) 0.75 % cream Apply 1 application topically daily.    . Multiple Vitamin (MULTIVITAMIN) tablet Take 1 tablet by mouth daily.    . [DISCONTINUED] levothyroxine (SYNTHROID, LEVOTHROID) 112 MCG tablet TAKE 1 TABLET BY MOUTH DAILY  . [DISCONTINUED] fluticasone (FLONASE) 50 MCG/ACT nasal spray One spray in each nostril twice a day, use left hand for right nostril, and right hand for left nostril.  . [DISCONTINUED] meloxicam (MOBIC) 15 MG tablet One tab PO qAM with breakfast for 2 weeks, then daily prn pain.    EXAM:  BP 130/60 mmHg  Temp(Src) 98 F (36.7 C) (Oral)  Ht 5\' 4"  (1.626 m)  Wt 130 lb 9.6 oz (59.24 kg)  BMI 22.41 kg/m2  Body mass index is 22.41 kg/(m^2).  Physical Exam: Vital signs reviewed WER:XVQM is a well-developed well-nourished alert cooperative   who appears stated age in no acute distress.  HEENT: normocephalic atraumatic , Eyes: PERRL EOM's full, conjunctiva clear, Nares: paten,t no deformity discharge or tenderness., Ears: no  deformity EAC's clear TMs with normal landmarks. Mouth: clear OP, no lesions, edema.  Moist mucous membranes. Dentition in adequate repair. NECK: supple without masses, thyromegaly or bruits. CHEST/PULM:  Clear to auscultation and percussion breath sounds equal no wheeze , rales or rhonchi. No chest wall deformities or tenderness. Breast: normal by inspection . No dimpling, discharge, masses, tenderness or discharge . CV: PMI is nondisplaced, S1 S2 no gallops, murmurs, rubs. Peripheral pulses are full without delay.No JVD .  ABDOMEN: Bowel sounds normal nontender  No guard or  rebound, no hepato splenomegal no CVA tenderness.  No hernia. Extremtities:  No clubbing cyanosis or edema, no acute joint swelling or redness no focal atrophy NEURO:  Oriented x3, cranial nerves 3-12 appear to be intact, no obvious focal weakness,gait within normal limits no abnormal reflexes or asymmetrical SKIN: No acute rashes normal turgor, color, no bruising or petechiae. right foot  interdigital rednes and scal and red bump 4 and 5th  Left foot middle toe with distal discolor and callus tip otherwise nl  PSYCH: Oriented, good eye contact, no obvious depression anxiety, cognition and judgment appear normal. LN: no cervical axillary inguinal adenopathy No noted deficits in memory, attention, and speech.   Lab Results  Component Value Date   WBC 4.2 09/01/2014   HGB 12.3 09/01/2014   HCT 36.6 09/01/2014   PLT 217.0 09/01/2014   GLUCOSE 90 09/01/2014   CHOL 241* 09/01/2014   TRIG 92.0 09/01/2014   HDL 59.40 09/01/2014   LDLDIRECT 165.2 07/21/2012   LDLCALC 163* 09/01/2014   ALT 15 09/01/2014   AST 17 09/01/2014   NA 136 09/01/2014   K 3.9 09/01/2014   CL 104 09/01/2014   CREATININE 0.8 09/01/2014   BUN 16 09/01/2014   CO2 28 09/01/2014   TSH 1.41 09/01/2014   HGBA1C 5.0 07/21/2012    ASSESSMENT AND PLAN:  Discussed the following assessment and plan:  Visit for preventive health examination - utd  counseled  Medicare annual wellness visit, subsequent  Hyperlipidemia - not interested in meds  ascvd riusk calculator 10.8  neg family hx except father dis diet lsi   Tinea pedis of right foot - ? local care a nd topical lamisil advise d  Hypothyroidism, unspecified hypothyroidism type - refill med  Hemochromatosis - under care dr Marin Olp  Primary osteoarthritis involving multiple joints ascvd card risk assessment  8+ % pt prefers no meds  Patient Care Team: Burnis Medin, MD as PCP - General Lafayette Dragon, MD (Gastroenterology) Allena Katz, MD as Attending Physician (Obstetrics and Gynecology) Bo Merino, MD (Rheumatology) inman   savage [name], MD as Consulting Physician (Oncology)  Patient Instructions   Yearly flu vaccine Continue lifestyle intervention healthy eating and exercise . Try lamisil on  Area poss atheletes foot.  Same thyroid medication  Healthy lifestyle includes : At least 150 minutes of exercise weeks  , weight at healthy levels, which is usually   BMI 19-25. Avoid trans fats and processed foods;  Increase fresh fruits and veges to 5 servings per day. And avoid sweet beverages including tea and juice. Mediterranean diet with olive oil and nuts have been noted to be heart and brain healthy . Avoid tobacco products . Limit  alcohol to  7 per week for women and 14 servings for men.  Get adequate sleep . Wear seat belts . Don't text and drive .       Why follow it? Research shows. . Those who follow the Mediterranean diet have a reduced risk of heart disease  . The diet is associated with a reduced incidence of Parkinson's and Alzheimer's diseases . People following the diet may have longer life expectancies and lower rates of chronic diseases  . The Dietary Guidelines for Americans recommends the Mediterranean diet as an eating plan to promote health and prevent disease  What Is the Mediterranean Diet?  . Healthy eating plan  based on typical foods and recipes of Mediterranean-style cooking . The diet is primarily a plant based  diet; these foods should make up a majority of meals   Starches - Plant based foods should make up a majority of meals - They are an important sources of vitamins, minerals, energy, antioxidants, and fiber - Choose whole grains, foods high in fiber and minimally processed items  - Typical grain sources include wheat, oats, barley, corn, brown rice, bulgar, farro, millet, polenta, couscous  - Various types of beans include chickpeas, lentils, fava beans, black beans, white beans   Fruits  Veggies - Large quantities of antioxidant rich fruits & veggies; 6 or more servings  - Vegetables can be eaten raw or lightly drizzled with oil and cooked  - Vegetables common to the traditional Mediterranean Diet include: artichokes, arugula, beets, broccoli, brussel sprouts, cabbage, carrots, celery, collard greens, cucumbers, eggplant, kale, leeks, lemons, lettuce, mushrooms, okra, onions, peas, peppers, potatoes, pumpkin, radishes, rutabaga, shallots, spinach, sweet potatoes, turnips, zucchini - Fruits common to the Mediterranean Diet include: apples, apricots, avocados, cherries, clementines, dates, figs, grapefruits, grapes, melons, nectarines, oranges, peaches, pears, pomegranates, strawberries, tangerines  Fats - Replace butter and margarine with healthy oils, such as olive oil, canola oil, and tahini  - Limit nuts to no more than a handful a day  - Nuts include walnuts, almonds, pecans, pistachios, pine nuts  - Limit or avoid candied, honey roasted or heavily salted nuts - Olives are central to the [employer] - can be eaten whole or used in a variety of dishes   Meats Protein - Limiting red meat: no more than a few times a month - When eating red meat: choose lean cuts and keep the portion to the size of deck of cards - Eggs: approx. 0 to 4 times a week  - Fish and lean poultry: at least 2 a  week  - Healthy protein sources include, chicken, [place], lean beef, lamb - Increase intake of seafood such as tuna, salmon, trout, mackerel, shrimp, scallops - Avoid or limit high fat processed meats such as sausage and bacon  Dairy - Include moderate amounts of low fat dairy products  - Focus on healthy dairy such as fat free yogurt, skim milk, low or reduced fat cheese - Limit dairy products higher in fat such as whole or 2% milk, cheese, ice cream  Alcohol - Moderate amounts of red wine is ok  - No more than 5 oz daily for women (all ages) and men older than age 81  - No more than 10 oz of wine daily for men younger than 64  Other - Limit sweets and other desserts  - Use herbs and spices instead of salt to flavor foods  - Herbs and spices common to the traditional Mediterranean Diet include: basil, bay leaves, chives, cloves, cumin, fennel, garlic, lavender, marjoram, mint, oregano, parsley, pepper, rosemary, sage, savory, sumac, tarragon, thyme   It's not just a diet, it's a lifestyle:  . The Mediterranean diet includes lifestyle factors typical of those in the region  . Foods, drinks and meals are best eaten with others and savored . Daily physical activity is important for overall good health . This could be strenuous exercise like running and aerobics . This could also be more leisurely activities such as walking, housework, yard-work, or taking the stairs . Moderation is the key; a balanced and healthy diet accommodates most foods and drinks . Consider portion sizes and frequency of consumption of certain foods   Meal Ideas & Options:  . Breakfast:  o Whole wheat toast or  whole wheat English muffins with peanut butter & hard boiled egg o Steel cut oats topped with apples & cinnamon and skim milk  o Fresh fruit: banana, strawberries, melon, berries, peaches  o Smoothies: strawberries, bananas, greek yogurt, peanut butter o Low fat greek yogurt with blueberries and granola  o Egg  white omelet with spinach and mushrooms o Breakfast couscous: whole wheat couscous, apricots, skim milk, cranberries  . Sandwiches:  o Hummus and grilled vegetables (peppers, zucchini, squash) on whole wheat bread   o Grilled chicken on whole wheat pita with lettuce, tomatoes, cucumbers or tzatziki  o Tuna salad on whole wheat bread: tuna salad made with greek yogurt, olives, red peppers, capers, green onions o Garlic rosemary lamb pita: lamb sauted with garlic, rosemary, salt & pepper; add lettuce, cucumber, greek yogurt to pita - flavor with lemon juice and black pepper  . Seafood:  o Mediterranean grilled salmon, seasoned with garlic, basil, parsley, lemon juice and black pepper o Shrimp, lemon, and spinach whole-grain pasta salad made with low fat greek yogurt  o Seared scallops with lemon orzo  o Seared tuna steaks seasoned salt, pepper, coriander topped with tomato mixture of olives, tomatoes, olive oil, minced garlic, parsley, green onions and cappers  . Meats:  o Herbed greek chicken salad with kalamata olives, cucumber, feta  o Red bell peppers stuffed with spinach, bulgur, lean ground beef (or lentils) & topped with feta   o Kebabs: skewers of chicken, tomatoes, onions, zucchini, squash  o [place] burgers: made with red onions, mint, dill, lemon juice, feta cheese topped with roasted red peppers . Vegetarian o Cucumber salad: cucumbers, artichoke hearts, celery, red onion, feta cheese, tossed in olive oil & lemon juice  o Hummus and whole grain pita points with a greek salad (lettuce, tomato, feta, olives, cucumbers, red onion) o Lentil soup with celery, carrots made with vegetable broth, garlic, salt and pepper  o Tabouli salad: parsley, bulgur, mint, scallions, cucumbers, tomato, radishes, lemon juice, olive oil, salt and pepper.         Standley Brooking. Eilidh Marcano M.D.

## 2014-09-05 NOTE — Patient Instructions (Addendum)
Yearly flu vaccine Continue lifestyle intervention healthy eating and exercise . Try lamisil on  Area poss atheletes foot.  Same thyroid medication  Healthy lifestyle includes : At least 150 minutes of exercise weeks  , weight at healthy levels, which is usually   BMI 19-25. Avoid trans fats and processed foods;  Increase fresh fruits and veges to 5 servings per day. And avoid sweet beverages including tea and juice. Mediterranean diet with olive oil and nuts have been noted to be heart and brain healthy . Avoid tobacco products . Limit  alcohol to  7 per week for women and 14 servings for men.  Get adequate sleep . Wear seat belts . Don't text and drive .       Why follow it? Research shows. . Those who follow the Mediterranean diet have a reduced risk of heart disease  . The diet is associated with a reduced incidence of Parkinson's and Alzheimer's diseases . People following the diet may have longer life expectancies and lower rates of chronic diseases  . The Dietary Guidelines for Americans recommends the Mediterranean diet as an eating plan to promote health and prevent disease  What Is the Mediterranean Diet?  . Healthy eating plan based on typical foods and recipes of Mediterranean-style cooking . The diet is primarily a plant based diet; these foods should make up a majority of meals   Starches - Plant based foods should make up a majority of meals - They are an important sources of vitamins, minerals, energy, antioxidants, and fiber - Choose whole grains, foods high in fiber and minimally processed items  - Typical grain sources include wheat, oats, barley, corn, brown rice, bulgar, farro, millet, polenta, couscous  - Various types of beans include chickpeas, lentils, fava beans, black beans, white beans   Fruits  Veggies - Large quantities of antioxidant rich fruits & veggies; 6 or more servings  - Vegetables can be eaten raw or lightly drizzled with oil and cooked  -  Vegetables common to the traditional Mediterranean Diet include: artichokes, arugula, beets, broccoli, brussel sprouts, cabbage, carrots, celery, collard greens, cucumbers, eggplant, kale, leeks, lemons, lettuce, mushrooms, okra, onions, peas, peppers, potatoes, pumpkin, radishes, rutabaga, shallots, spinach, sweet potatoes, turnips, zucchini - Fruits common to the Mediterranean Diet include: apples, apricots, avocados, cherries, clementines, dates, figs, grapefruits, grapes, melons, nectarines, oranges, peaches, pears, pomegranates, strawberries, tangerines  Fats - Replace butter and margarine with healthy oils, such as olive oil, canola oil, and tahini  - Limit nuts to no more than a handful a day  - Nuts include walnuts, almonds, pecans, pistachios, pine nuts  - Limit or avoid candied, honey roasted or heavily salted nuts - Olives are central to the [employer] - can be eaten whole or used in a variety of dishes   Meats Protein - Limiting red meat: no more than a few times a month - When eating red meat: choose lean cuts and keep the portion to the size of deck of cards - Eggs: approx. 0 to 4 times a week  - Fish and lean poultry: at least 2 a week  - Healthy protein sources include, chicken, [place], lean beef, lamb - Increase intake of seafood such as tuna, salmon, trout, mackerel, shrimp, scallops - Avoid or limit high fat processed meats such as sausage and bacon  Dairy - Include moderate amounts of low fat dairy products  - Focus on healthy dairy such as fat free yogurt, skim milk, low or reduced  fat cheese - Limit dairy products higher in fat such as whole or 2% milk, cheese, ice cream  Alcohol - Moderate amounts of red wine is ok  - No more than 5 oz daily for women (all ages) and men older than age 15  - No more than 10 oz of wine daily for men younger than 90  Other - Limit sweets and other desserts  - Use herbs and spices instead of salt to flavor foods  - Herbs and spices  common to the traditional Mediterranean Diet include: basil, bay leaves, chives, cloves, cumin, fennel, garlic, lavender, marjoram, mint, oregano, parsley, pepper, rosemary, sage, savory, sumac, tarragon, thyme   It's not just a diet, it's a lifestyle:  . The Mediterranean diet includes lifestyle factors typical of those in the region  . Foods, drinks and meals are best eaten with others and savored . Daily physical activity is important for overall good health . This could be strenuous exercise like running and aerobics . This could also be more leisurely activities such as walking, housework, yard-work, or taking the stairs . Moderation is the key; a balanced and healthy diet accommodates most foods and drinks . Consider portion sizes and frequency of consumption of certain foods   Meal Ideas & Options:  . Breakfast:  o Whole wheat toast or whole wheat English muffins with peanut butter & hard boiled egg o Steel cut oats topped with apples & cinnamon and skim milk  o Fresh fruit: banana, strawberries, melon, berries, peaches  o Smoothies: strawberries, bananas, greek yogurt, peanut butter o Low fat greek yogurt with blueberries and granola  o Egg white omelet with spinach and mushrooms o Breakfast couscous: whole wheat couscous, apricots, skim milk, cranberries  . Sandwiches:  o Hummus and grilled vegetables (peppers, zucchini, squash) on whole wheat bread   o Grilled chicken on whole wheat pita with lettuce, tomatoes, cucumbers or tzatziki  o Tuna salad on whole wheat bread: tuna salad made with greek yogurt, olives, red peppers, capers, green onions o Garlic rosemary lamb pita: lamb sauted with garlic, rosemary, salt & pepper; add lettuce, cucumber, greek yogurt to pita - flavor with lemon juice and black pepper  . Seafood:  o Mediterranean grilled salmon, seasoned with garlic, basil, parsley, lemon juice and black pepper o Shrimp, lemon, and spinach whole-grain pasta salad made with  low fat greek yogurt  o Seared scallops with lemon orzo  o Seared tuna steaks seasoned salt, pepper, coriander topped with tomato mixture of olives, tomatoes, olive oil, minced garlic, parsley, green onions and cappers  . Meats:  o Herbed greek chicken salad with kalamata olives, cucumber, feta  o Red bell peppers stuffed with spinach, bulgur, lean ground beef (or lentils) & topped with feta   o Kebabs: skewers of chicken, tomatoes, onions, zucchini, squash  o [place] burgers: made with red onions, mint, dill, lemon juice, feta cheese topped with roasted red peppers . Vegetarian o Cucumber salad: cucumbers, artichoke hearts, celery, red onion, feta cheese, tossed in olive oil & lemon juice  o Hummus and whole grain pita points with a greek salad (lettuce, tomato, feta, olives, cucumbers, red onion) o Lentil soup with celery, carrots made with vegetable broth, garlic, salt and pepper  o Tabouli salad: parsley, bulgur, mint, scallions, cucumbers, tomato, radishes, lemon juice, olive oil, salt and pepper.

## 2014-09-06 ENCOUNTER — Ambulatory Visit: Payer: Medicare Other | Admitting: Sports Medicine

## 2014-09-08 ENCOUNTER — Encounter: Payer: Medicare Other | Admitting: Internal Medicine

## 2014-09-25 DIAGNOSIS — H5203 Hypermetropia, bilateral: Secondary | ICD-10-CM | POA: Diagnosis not present

## 2014-09-26 DIAGNOSIS — R87610 Atypical squamous cells of undetermined significance on cytologic smear of cervix (ASC-US): Secondary | ICD-10-CM | POA: Diagnosis not present

## 2014-09-26 DIAGNOSIS — N819 Female genital prolapse, unspecified: Secondary | ICD-10-CM | POA: Diagnosis not present

## 2014-09-26 DIAGNOSIS — Z01419 Encounter for gynecological examination (general) (routine) without abnormal findings: Secondary | ICD-10-CM | POA: Diagnosis not present

## 2014-09-26 DIAGNOSIS — N94810 Vulvar vestibulitis: Secondary | ICD-10-CM | POA: Diagnosis not present

## 2014-09-28 ENCOUNTER — Ambulatory Visit: Payer: Medicare Other | Admitting: Sports Medicine

## 2014-10-02 ENCOUNTER — Encounter: Payer: Self-pay | Admitting: Sports Medicine

## 2014-10-02 ENCOUNTER — Ambulatory Visit (INDEPENDENT_AMBULATORY_CARE_PROVIDER_SITE_OTHER): Payer: Medicare Other | Admitting: Sports Medicine

## 2014-10-02 VITALS — BP 122/64 | HR 67 | Ht 64.0 in | Wt 130.0 lb

## 2014-10-02 DIAGNOSIS — M1611 Unilateral primary osteoarthritis, right hip: Secondary | ICD-10-CM | POA: Diagnosis not present

## 2014-10-02 DIAGNOSIS — M5417 Radiculopathy, lumbosacral region: Secondary | ICD-10-CM

## 2014-10-02 DIAGNOSIS — M5416 Radiculopathy, lumbar region: Secondary | ICD-10-CM

## 2014-10-02 MED ORDER — PREDNISONE 50 MG PO TABS: 50.0000 mg | ORAL_TABLET | Freq: Every day | ORAL | Status: DC

## 2014-10-02 MED ORDER — RANITIDINE HCL 300 MG PO TABS: 300.0000 mg | ORAL_TABLET | Freq: Every day | ORAL | Status: DC

## 2014-10-02 NOTE — Assessment & Plan Note (Signed)
Continues to have pain but some of this is delayed onset muscle soreness from putting up all of her Christmas decorations on Saturday. 5 days of prednisone, if no improvement in a week we will obtain an MRI for interventional planning. We will do 300 mg of Zantac daily while using the prednisone.

## 2014-10-02 NOTE — Assessment & Plan Note (Signed)
Continues to have pain but some of this is delayed onset muscle soreness from putting up all of her Christmas decorations on Saturday. 5 days of prednisone, if no improvement in a week I will inject both of her hips.

## 2014-10-02 NOTE — Progress Notes (Signed)
  Subjective:    CC: Follow-up  HPI: Low back pain: Was doing very well with physical therapy and simple NSAIDs however she recently put up all of her Christmas decorations on Saturday, 2 days ago. Afterwards she developed increasing pain, only with movement and not rest, nothing radicular.  Bilateral hip pain: Pain is localized at the groin, again, worse with putting up Christmas decorations but moderate, persistent.  Past medical history, Surgical history, Family history not pertinant except as noted below, Social history, Allergies, and medications have been entered into the medical record, reviewed, and no changes needed.   Review of Systems: No fevers, chills, night sweats, weight loss, chest pain, or shortness of breath.   Objective:    General: Well Developed, well nourished, and in no acute distress.  Neuro: Alert and oriented x3, extra-ocular muscles intact, sensation grossly intact.  HEENT: Normocephalic, atraumatic, pupils equal round reactive to light, neck supple, no masses, no lymphadenopathy, thyroid nonpalpable.  Skin: Warm and dry, no rashes. Cardiac: Regular rate and rhythm, no murmurs rubs or gallops, no lower extremity edema.  Respiratory: Clear to auscultation bilaterally. Not using accessory muscles, speaking in full sentences. Bilateral Hip: ROM Less than 10 of internal rotation bilaterally, this does reproduce pain. Strength IR: 5/5, ER: 5/5, Flexion: 5/5, Extension: 5/5, Abduction: 5/5, Adduction: 5/5 Pelvic alignment unremarkable to inspection and palpation. Standing hip rotation and gait without trendelenburg / unsteadiness. Greater trochanter without tenderness to palpation. No tenderness over piriformis. No SI joint tenderness and normal minimal SI movement.  Impression and Recommendations:

## 2014-10-05 ENCOUNTER — Telehealth: Payer: Self-pay

## 2014-10-05 NOTE — Telephone Encounter (Signed)
Grettell reports the prednisone is causing diarrhea and insomnia. I advised patient to try to take just half a tablet in the morning. Advised if problems persisted then call the office.

## 2014-10-05 NOTE — Telephone Encounter (Signed)
Angela Burgess called and left a message stating the prednisone keep her up all night with diarrhea. Left message for patient to return call.

## 2014-10-05 NOTE — Telephone Encounter (Signed)
Good advice, thanks 

## 2014-10-06 ENCOUNTER — Telehealth: Payer: Self-pay | Admitting: Hematology & Oncology

## 2014-10-06 NOTE — Telephone Encounter (Signed)
Pt scheduled 1-25, she said she wasn't feeling great but just thought she should see the MD. I left RN message to make sure she didn't need to see someone sooner. There is no inbasket that was sent to schedule follow up. MD note said he wanted to see pt in 6-8 weeks from aug appointment

## 2014-10-10 ENCOUNTER — Encounter: Payer: Self-pay | Admitting: Sports Medicine

## 2014-10-10 ENCOUNTER — Ambulatory Visit (INDEPENDENT_AMBULATORY_CARE_PROVIDER_SITE_OTHER): Payer: Medicare Other | Admitting: Sports Medicine

## 2014-10-10 VITALS — BP 137/73 | HR 68 | Ht 64.0 in | Wt 130.0 lb

## 2014-10-10 DIAGNOSIS — M5416 Radiculopathy, lumbar region: Secondary | ICD-10-CM

## 2014-10-10 DIAGNOSIS — M5417 Radiculopathy, lumbosacral region: Secondary | ICD-10-CM | POA: Diagnosis not present

## 2014-10-10 DIAGNOSIS — M1611 Unilateral primary osteoarthritis, right hip: Secondary | ICD-10-CM | POA: Diagnosis not present

## 2014-10-10 MED ORDER — TRAMADOL HCL 50 MG PO TABS: ORAL_TABLET | ORAL | Status: DC

## 2014-10-10 NOTE — Assessment & Plan Note (Signed)
Pain free.

## 2014-10-10 NOTE — Progress Notes (Signed)
  Subjective:    CC: Follow-up  HPI: This pleasant 74 year old female returns, she continues to have pain in her low back worse with flexion, hip pain has resolved. Back pain is moderate, persistent and she is amenable to try an MRI for interventional injection planning.  Past medical history, Surgical history, Family history not pertinant except as noted below, Social history, Allergies, and medications have been entered into the medical record, reviewed, and no changes needed.   Review of Systems: No fevers, chills, night sweats, weight loss, chest pain, or shortness of breath.   Objective:    General: Well Developed, well nourished, and in no acute distress.  Neuro: Alert and oriented x3, extra-ocular muscles intact, sensation grossly intact.  HEENT: Normocephalic, atraumatic, pupils equal round reactive to light, neck supple, no masses, no lymphadenopathy, thyroid nonpalpable.  Skin: Warm and dry, no rashes. Cardiac: Regular rate and rhythm, no murmurs rubs or gallops, no lower extremity edema.  Respiratory: Clear to auscultation bilaterally. Not using accessory muscles, speaking in full sentences.  Impression and Recommendations:

## 2014-10-10 NOTE — Assessment & Plan Note (Signed)
Clinically represents an L5 radiculopathy. Axial pain is discogenic. At this point we are going to proceed with an MRI, return to go over results and for interventional planning. She would also like custom orthotics. Tylenol has been ineffective, switching to tramadol.

## 2014-10-11 ENCOUNTER — Telehealth: Payer: Self-pay | Admitting: *Deleted

## 2014-10-11 NOTE — Telephone Encounter (Signed)
MRI lumbar approval [id number] expires 11/25/14. Radiology notified.

## 2014-10-12 ENCOUNTER — Telehealth: Payer: Self-pay

## 2014-10-12 MED ORDER — DIAZEPAM 5 MG PO TABS: ORAL_TABLET | ORAL | Status: DC

## 2014-10-12 NOTE — Telephone Encounter (Signed)
Rx has been faxed to [facility]. Rhonda Cunningham,CMA

## 2014-10-12 NOTE — Telephone Encounter (Signed)
Patient is scheduled for MRI on Monday and she is requesting a rx for Valium sent to her pharmacy. Akaila Rambo,CMA

## 2014-10-12 NOTE — Telephone Encounter (Signed)
Rx in box. 

## 2014-10-16 ENCOUNTER — Encounter: Payer: Self-pay | Admitting: Hematology & Oncology

## 2014-10-16 ENCOUNTER — Ambulatory Visit (HOSPITAL_BASED_OUTPATIENT_CLINIC_OR_DEPARTMENT_OTHER): Payer: Medicare Other | Admitting: Hematology & Oncology

## 2014-10-16 ENCOUNTER — Other Ambulatory Visit: Payer: Self-pay | Admitting: *Deleted

## 2014-10-16 ENCOUNTER — Other Ambulatory Visit (HOSPITAL_BASED_OUTPATIENT_CLINIC_OR_DEPARTMENT_OTHER): Payer: Medicare Other | Admitting: Lab

## 2014-10-16 ENCOUNTER — Ambulatory Visit (INDEPENDENT_AMBULATORY_CARE_PROVIDER_SITE_OTHER): Payer: Medicare Other

## 2014-10-16 DIAGNOSIS — M5126 Other intervertebral disc displacement, lumbar region: Secondary | ICD-10-CM | POA: Diagnosis not present

## 2014-10-16 DIAGNOSIS — M5416 Radiculopathy, lumbar region: Secondary | ICD-10-CM

## 2014-10-16 DIAGNOSIS — M47897 Other spondylosis, lumbosacral region: Secondary | ICD-10-CM

## 2014-10-16 DIAGNOSIS — M4806 Spinal stenosis, lumbar region: Secondary | ICD-10-CM | POA: Diagnosis not present

## 2014-10-16 LAB — CBC WITH DIFFERENTIAL (CANCER CENTER ONLY)
BASO#: 0 10*3/uL (ref 0.0–0.2)
BASO%: 0.3 % (ref 0.0–2.0)
EOS ABS: 0.1 10*3/uL (ref 0.0–0.5)
EOS%: 1 % (ref 0.0–7.0)
HEMATOCRIT: 35.4 % (ref 34.8–46.6)
HGB: 12.4 g/dL (ref 11.6–15.9)
LYMPH#: 1.5 10*3/uL (ref 0.9–3.3)
LYMPH%: 24 % (ref 14.0–48.0)
MCH: 36.7 pg — ABNORMAL HIGH (ref 26.0–34.0)
MCHC: 35 g/dL (ref 32.0–36.0)
MCV: 105 fL — ABNORMAL HIGH (ref 81–101)
MONO#: 0.5 10*3/uL (ref 0.1–0.9)
MONO%: 7.3 % (ref 0.0–13.0)
NEUT#: 4.1 10*3/uL (ref 1.5–6.5)
NEUT%: 67.4 % (ref 39.6–80.0)
Platelets: 202 10*3/uL (ref 145–400)
RBC: 3.38 10*6/uL — ABNORMAL LOW (ref 3.70–5.32)
RDW: 11.1 % (ref 11.1–15.7)
WBC: 6.1 10*3/uL (ref 3.9–10.0)

## 2014-10-16 LAB — FERRITIN CHCC: FERRITIN: 92 ng/mL (ref 9–269)

## 2014-10-16 LAB — IRON AND TIBC CHCC
%SAT: 69 % — ABNORMAL HIGH (ref 21–57)
Iron: 142 ug/dL (ref 41–142)
TIBC: 206 ug/dL — AB (ref 236–444)
UIBC: 64 ug/dL — AB (ref 120–384)

## 2014-10-16 NOTE — Progress Notes (Signed)
Hematology and Oncology Follow Up Visit  Angela Burgess [id number] 07-02-1941 74 y.o. 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  Burgess is back for followup.  She comes in with her husband. They're doing quite well. She and her husband had a very good Christmas holiday.  She was last phlebotomize I think back in May. When she had her iron checked in October, her ferritin was 88.  She feels well. She's had no bleeding. She's had no nausea or vomiting. She's had no cough. There's been no fever. She's had no joint aches or pains. She's had no rashes.  They are planning on a river cruise sometime this year. They just do not know when they will go.    Medications:  Current outpatient prescriptions:  .  aspirin 81 MG tablet, Take 81 mg by mouth daily., Disp: , Rfl:  .  Astaxanthin 4 MG CAPS, Take 1 capsule by mouth 2 (two) times daily., Disp: , Rfl:  .  Calcium Carbonate-Vitamin D (CALCIUM 500/D) 500-125 MG-UNIT TABS, Take by mouth.  , Disp: , Rfl:  .  Cholecalciferol (VITAMIN D3) 2000 UNITS TABS, Take by mouth every morning., Disp: , Rfl:  .  diazepam (VALIUM) 5 MG tablet, Take 1 tab PO 1 hour before procedure or imaging., Disp: 2 tablet, Rfl: 0 .  estradiol (ESTRACE) 0.1 MG/GM vaginal cream, Place 1 Applicatorful vaginally 2 (two) times a week. , Disp: , Rfl:  .  fish oil-omega-3 fatty acids 1000 MG capsule, Take 2 g by mouth daily.  , Disp: , Rfl:  .  levothyroxine (SYNTHROID, LEVOTHROID) 112 MCG tablet, Take 1 tablet (112 mcg total) by mouth daily., Disp: 90 tablet, Rfl: 3 .  metroNIDAZOLE (METROCREAM) 0.75 % cream, Apply 1 application topically daily.  , Disp: , Rfl:  .  Multiple Vitamin (MULTIVITAMIN) tablet, Take 1 tablet by mouth daily.  , Disp: , Rfl:  .  traMADol (ULTRAM) 50 MG tablet, 0.5 tabs by mouth Q8 hours, maximum 6 tabs per day., Disp:  30 tablet, Rfl: 0 .  ranitidine (ZANTAC) 300 MG tablet, Take 1 tablet (300 mg total) by mouth daily. (Patient not taking: Reported on 10/16/2014), Disp: 30 tablet, Rfl: 3  Allergies:  Allergies  Allergen Reactions  . Tetracycline     Past Medical History, Surgical history, Social history, and Family History were reviewed and updated.  Review of Systems: As above  Physical Exam:  height is 5\' 4"  (1.626 m) and weight is 131 lb (59.421 kg). Her oral temperature is 97.8 F (36.6 C). Her blood pressure is 131/47 and her pulse is 69. Her respiration is 14.   Well-developed and well-nourished white female. Head and exam shows no ocular or oral lesions. She is no palpable cervical or supraclavicular lymph nodes. Lungs are clear. Cardiac exam regular rate and rhythm with no murmurs, rubs or bruits.. Abdomen is soft. She has good bowel sounds. There is no fluid wave. There is no palpable liver or spleen. Back exam shows no tenderness over the spine, ribs or hips. Extremities shows no clubbing cyanosis or edema. Skin exam shows no rashes, ecchymoses or petechia. Neurological exam with no focal deficits.  Lab Results  Component Value Date   WBC 6.1 10/16/2014   HGB 12.4 10/16/2014   HCT 35.4 10/16/2014   MCV 105* 10/16/2014   PLT 202 10/16/2014  Chemistry      Component Value Date/Time   NA 136 09/01/2014 0905   K 3.9 09/01/2014 0905   CL 104 09/01/2014 0905   CO2 28 09/01/2014 0905   BUN 16 09/01/2014 0905   CREATININE 0.8 09/01/2014 0905      Component Value Date/Time   CALCIUM 9.3 09/01/2014 0905   ALKPHOS 50 09/01/2014 0905   AST 17 09/01/2014 0905   ALT 15 09/01/2014 0905   BILITOT 0.6 09/01/2014 0905         Impression and Plan: Angela Burgess is 74 year old female with hemachromatosis. She is homozygous for the major  C282Y mutation.  She has been doing well with phlebotomies. We have not had to phlebotomize her now for several months.  We will see what her ferritin  level is. I suspect that the ferritin level probably will be over 100. This will determine when she comes back to see [place].  Hopefully, we will probably get her back in about 6-8 weeks.  She and her husband like to travel. I don't see any restrictions that she needs.   [name], MD 1/25/20161:45 PM

## 2014-10-16 NOTE — Addendum Note (Signed)
Addended by: Burney Gauze R on: 10/16/2014 03:25 PM   Modules accepted: Orders

## 2014-10-30 ENCOUNTER — Encounter: Payer: Self-pay | Admitting: Sports Medicine

## 2014-10-30 ENCOUNTER — Ambulatory Visit (INDEPENDENT_AMBULATORY_CARE_PROVIDER_SITE_OTHER): Payer: Medicare Other | Admitting: Sports Medicine

## 2014-10-30 DIAGNOSIS — M47896 Other spondylosis, lumbar region: Secondary | ICD-10-CM | POA: Diagnosis not present

## 2014-10-30 NOTE — Assessment & Plan Note (Signed)
There are 2 separate pain generators, the right L5 nerve root, it does appear to contact the right L5-S1 disc slightly extra foraminal. The main pain generator however is the left L5-S1 facet joint. We are going to start with a left L5-S1 facet injection, with close attention paid to how she feels during the injection, an hour after, a minute after, and 2 weeks after. I would like to see her back approximately 3 weeks after the injection to evaluate response, and if she does well she would probably be a candidate for a radiofrequency ablation of the left L5-S1 facet. Afterwards if she continues to have persistent right-sided radicular pain she would then be a candidate for a right L5-S1 transforaminal epidural injection for lumbar radiculopathy.

## 2014-10-30 NOTE — Progress Notes (Signed)
  Subjective:    CC: Follow-up MRI results  HPI: Low back pain: Right-sided L5 radiculopathy but left-sided axial back pain worse with standing. She tells me that her worse symptom is on the left side of the low back and with standing, the radicular symptoms are minor.  Past medical history, Surgical history, Family history not pertinant except as noted below, Social history, Allergies, and medications have been entered into the medical record, reviewed, and no changes needed.   Review of Systems: No fevers, chills, night sweats, weight loss, chest pain, or shortness of breath.   Objective:    General: Well Developed, well nourished, and in no acute distress.  Neuro: Alert and oriented x3, extra-ocular muscles intact, sensation grossly intact.  HEENT: Normocephalic, atraumatic, pupils equal round reactive to light, neck supple, no masses, no lymphadenopathy, thyroid nonpalpable.  Skin: Warm and dry, no rashes. Cardiac: Regular rate and rhythm, no murmurs rubs or gallops, no lower extremity edema.  Respiratory: Clear to auscultation bilaterally. Not using accessory muscles, speaking in full sentences.  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  Impression and Recommendations:

## 2014-11-06 ENCOUNTER — Encounter: Payer: Self-pay | Admitting: Sports Medicine

## 2014-11-06 ENCOUNTER — Ambulatory Visit (INDEPENDENT_AMBULATORY_CARE_PROVIDER_SITE_OTHER): Payer: Medicare Other | Admitting: Sports Medicine

## 2014-11-06 VITALS — BP 125/60 | HR 75 | Ht 64.0 in | Wt 135.0 lb

## 2014-11-06 DIAGNOSIS — M47896 Other spondylosis, lumbar region: Secondary | ICD-10-CM

## 2014-11-06 NOTE — Assessment & Plan Note (Signed)
Custom orthotics as above, still awaiting facet injection. I will see her back 3 weeks after the facet joint injection.

## 2014-11-06 NOTE — Progress Notes (Signed)

## 2014-11-13 ENCOUNTER — Other Ambulatory Visit: Payer: Medicare Other

## 2014-11-13 ENCOUNTER — Ambulatory Visit
Admission: RE | Admit: 2014-11-13 | Discharge: 2014-11-13 | Disposition: A | Discharge status: Home / Self Care | Payer: Medicare Other | Source: Ambulatory Visit | Attending: Sports Medicine | Admitting: Sports Medicine

## 2014-11-13 DIAGNOSIS — M47817 Spondylosis without myelopathy or radiculopathy, lumbosacral region: Secondary | ICD-10-CM | POA: Diagnosis not present

## 2014-11-13 DIAGNOSIS — M4726 Other spondylosis with radiculopathy, lumbar region: Secondary | ICD-10-CM

## 2014-11-13 DIAGNOSIS — M545 Low back pain: Secondary | ICD-10-CM | POA: Diagnosis not present

## 2014-11-13 MED ORDER — IOHEXOL 180 MG/ML  SOLN
1.0000 mL | Freq: Once | INTRAMUSCULAR | Status: AC | PRN
  Administered 2014-11-13: 1 mL via INTRA_ARTICULAR

## 2014-11-13 MED ORDER — METHYLPREDNISOLONE ACETATE 40 MG/ML INJ SUSP (RADIOLOG
120.0000 mg | Freq: Once | INTRAMUSCULAR | Status: AC
  Administered 2014-11-13: 120 mg via INTRA_ARTICULAR

## 2014-11-13 NOTE — Discharge Instructions (Signed)

## 2014-11-17 ENCOUNTER — Other Ambulatory Visit: Payer: Self-pay | Admitting: Internal Medicine

## 2014-11-17 NOTE — Telephone Encounter (Signed)
Denied.  Filled in Dec 2015 for 1 year

## 2014-12-04 ENCOUNTER — Ambulatory Visit (INDEPENDENT_AMBULATORY_CARE_PROVIDER_SITE_OTHER): Payer: Medicare Other | Admitting: Sports Medicine

## 2014-12-04 ENCOUNTER — Encounter: Payer: Self-pay | Admitting: Sports Medicine

## 2014-12-04 VITALS — BP 124/67 | HR 62 | Wt 130.0 lb

## 2014-12-04 DIAGNOSIS — M47896 Other spondylosis, lumbar region: Secondary | ICD-10-CM | POA: Diagnosis not present

## 2014-12-04 NOTE — Progress Notes (Signed)
  Subjective:    CC: Follow-up  HPI: This is a pleasant 74 year old female, she had initially right-sided L5 radicular symptoms and left-sided axial facetogenic type pain. She did have severe facet arthritis on her MRI, we proceeded with a left L5-S1 facet injection that resulted in complete pain relief for 2 weeks, she has been working outside, and is now having recurrence of pain, this is also starting to improve. She is very happy with how things did.  Past medical history, Surgical history, Family history not pertinant except as noted below, Social history, Allergies, and medications have been entered into the medical record, reviewed, and no changes needed.   Review of Systems: No fevers, chills, night sweats, weight loss, chest pain, or shortness of breath.   Objective:    General: Well Developed, well nourished, and in no acute distress.  Neuro: Alert and oriented x3, extra-ocular muscles intact, sensation grossly intact.  HEENT: Normocephalic, atraumatic, pupils equal round reactive to light, neck supple, no masses, no lymphadenopathy, thyroid nonpalpable.  Skin: Warm and dry, no rashes. Cardiac: Regular rate and rhythm, no murmurs rubs or gallops, no lower extremity edema.  Respiratory: Clear to auscultation bilaterally. Not using accessory muscles, speaking in full sentences.  Impression and Recommendations:

## 2014-12-04 NOTE — Assessment & Plan Note (Signed)
Excellent response to a left-sided L5-S1 facet injection with complete relief of back pain for 2-1/2 weeks, she has recently been working in the garden, seems to have overdone it, and is having a recurrence of pain that is also improving. Before we consider repeat injection or proceeding down the pathway of radio frequency ablation we will give her a month to recover. She will likely get 9-12 months of complete relief after radio frequency ablation if she has a good response to the medial branch blocks. This can then be repeated an additional 9012 months. She can simply call me for further intervention.

## 2014-12-25 ENCOUNTER — Encounter: Payer: Self-pay | Admitting: Hematology & Oncology

## 2014-12-25 ENCOUNTER — Other Ambulatory Visit (HOSPITAL_BASED_OUTPATIENT_CLINIC_OR_DEPARTMENT_OTHER): Payer: Medicare Other

## 2014-12-25 ENCOUNTER — Ambulatory Visit (HOSPITAL_BASED_OUTPATIENT_CLINIC_OR_DEPARTMENT_OTHER): Payer: Medicare Other | Admitting: Hematology & Oncology

## 2014-12-25 ENCOUNTER — Ambulatory Visit (HOSPITAL_BASED_OUTPATIENT_CLINIC_OR_DEPARTMENT_OTHER): Payer: Medicare Other

## 2014-12-25 VITALS — BP 138/46 | HR 61 | Resp 18

## 2014-12-25 DIAGNOSIS — R42 Dizziness and giddiness: Secondary | ICD-10-CM

## 2014-12-25 LAB — CBC WITH DIFFERENTIAL (CANCER CENTER ONLY)
BASO#: 0 10*3/uL (ref 0.0–0.2)
BASO%: 0.4 % (ref 0.0–2.0)
EOS%: 1 % (ref 0.0–7.0)
Eosinophils Absolute: 0.1 10*3/uL (ref 0.0–0.5)
HCT: 36 % (ref 34.8–46.6)
HGB: 12.7 g/dL (ref 11.6–15.9)
LYMPH#: 1.4 10*3/uL (ref 0.9–3.3)
LYMPH%: 29.2 % (ref 14.0–48.0)
MCH: 37.2 pg — AB (ref 26.0–34.0)
MCHC: 35.3 g/dL (ref 32.0–36.0)
MCV: 106 fL — AB (ref 81–101)
MONO#: 0.4 10*3/uL (ref 0.1–0.9)
MONO%: 8.4 % (ref 0.0–13.0)
NEUT#: 2.9 10*3/uL (ref 1.5–6.5)
NEUT%: 61 % (ref 39.6–80.0)
Platelets: 246 10*3/uL (ref 145–400)
RBC: 3.41 10*6/uL — ABNORMAL LOW (ref 3.70–5.32)
RDW: 11.2 % (ref 11.1–15.7)
WBC: 4.8 10*3/uL (ref 3.9–10.0)

## 2014-12-25 MED ORDER — SODIUM CHLORIDE 0.9 % IV SOLN
1000.0000 mL | Freq: Once | INTRAVENOUS | Status: AC
  Administered 2014-12-25: 1000 mL via INTRAVENOUS

## 2014-12-25 NOTE — Patient Instructions (Signed)

## 2014-12-25 NOTE — Progress Notes (Signed)
Hematology and Oncology Follow Up Visit  Angela Burgess [id number] 03-Mar-1941 74 y.o. 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  She comes in with her husband. They're doing quite well. She and her husband have been doing well she's last saw her in January.  Back in January, her ferritin was up to 92. I suspect that she probably is going to need a phlebotomy. Last time she had a phlebotomy was about 10 months ago.  She feels well. She's had no bleeding. She's had no nausea or vomiting. She's had no cough. There's been no fever. She's had no joint aches or pains. She's had no rashes.  They are planning on a river cruise sometime this year. They just do not know when they will go.    Medications:  Current outpatient prescriptions:  .  aspirin 81 MG tablet, Take 81 mg by mouth daily., Disp: , Rfl:  .  Astaxanthin 4 MG CAPS, Take 1 capsule by mouth 2 (two) times daily., Disp: , Rfl:  .  Calcium Carbonate-Vitamin D (CALCIUM 500/D) 500-125 MG-UNIT TABS, Take by mouth.  , Disp: , Rfl:  .  Cholecalciferol (VITAMIN D3) 2000 UNITS TABS, Take by mouth every morning., Disp: , Rfl:  .  estradiol (ESTRACE) 0.1 MG/GM vaginal cream, Place 1 Applicatorful vaginally 2 (two) times a week. , Disp: , Rfl:  .  fish oil-omega-3 fatty acids 1000 MG capsule, Take 2 g by mouth daily.  , Disp: , Rfl:  .  levothyroxine (SYNTHROID, LEVOTHROID) 112 MCG tablet, Take 1 tablet (112 mcg total) by mouth daily., Disp: 90 tablet, Rfl: 3 .  metroNIDAZOLE (METROCREAM) 0.75 % cream, Apply 1 application topically daily.  , Disp: , Rfl:  .  Multiple Vitamin (MULTIVITAMIN) tablet, Take 1 tablet by mouth daily.  , Disp: , Rfl:   Allergies:  Allergies  Allergen Reactions  . Tetracycline Rash    Past Medical History, Surgical history, Social history, and  Family History were reviewed and updated.  Review of Systems: As above  Physical Exam:  height is 5\' 4"  (1.626 m) and weight is 132 lb (59.875 kg). Her oral temperature is 97.7 F (36.5 C). Her blood pressure is 143/63 and her pulse is 70. Her respiration is 14.   Well-developed and well-nourished white female. Head and exam shows no ocular or oral lesions. She is no palpable cervical or supraclavicular lymph nodes. Lungs are clear. Cardiac exam regular rate and rhythm with no murmurs, rubs or bruits.. Abdomen is soft. She has good bowel sounds. There is no fluid wave. There is no palpable liver or spleen. Back exam shows no tenderness over the spine, ribs or hips. Extremities shows no clubbing cyanosis or edema. Skin exam shows no rashes, ecchymoses or petechia. Neurological exam with no focal deficits.  Lab Results  Component Value Date   WBC 4.8 12/25/2014   HGB 12.7 12/25/2014   HCT 36.0 12/25/2014   MCV 106* 12/25/2014   PLT 246 12/25/2014     Chemistry      Component Value Date/Time   NA 136 09/01/2014 0905   K 3.9 09/01/2014 0905   CL 104 09/01/2014 0905   CO2 28 09/01/2014 0905   BUN 16 09/01/2014 0905   CREATININE 0.8 09/01/2014 0905      Component Value Date/Time  CALCIUM 9.3 09/01/2014 0905   ALKPHOS 50 09/01/2014 0905   AST 17 09/01/2014 0905   ALT 15 09/01/2014 0905   BILITOT 0.6 09/01/2014 0905         Impression and Plan: Ms. Chabot is 74 year old female with hemachromatosis. She is homozygous for the major  C282Y mutation.  She has been doing well with phlebotomies. We have not had to phlebotomize her now for several months. again, I think that we aren't physician to phlebotomize her today. Even though the iron levels not backache, I spent that she probably will be over 100. Her iron saturation is on the high side.   Hopefully, we will probably get her back in about 8 weeks.  She and her husband like to travel. I don't see any restrictions that she  needs.   [name], MD 4/4/201611:24 AM

## 2014-12-26 DIAGNOSIS — M19041 Primary osteoarthritis, right hand: Secondary | ICD-10-CM | POA: Diagnosis not present

## 2014-12-26 DIAGNOSIS — M160 Bilateral primary osteoarthritis of hip: Secondary | ICD-10-CM | POA: Diagnosis not present

## 2014-12-26 DIAGNOSIS — M5137 Other intervertebral disc degeneration, lumbosacral region: Secondary | ICD-10-CM | POA: Diagnosis not present

## 2014-12-26 LAB — IRON AND TIBC CHCC
%SAT: 84 % — ABNORMAL HIGH (ref 21–57)
Iron: 174 ug/dL — ABNORMAL HIGH (ref 41–142)
TIBC: 208 ug/dL — ABNORMAL LOW (ref 236–444)
UIBC: 34 ug/dL — ABNORMAL LOW (ref 120–384)

## 2014-12-26 LAB — FERRITIN CHCC: FERRITIN: 97 ng/mL (ref 9–269)

## 2014-12-27 ENCOUNTER — Other Ambulatory Visit: Payer: Self-pay | Admitting: Hematology & Oncology

## 2014-12-27 ENCOUNTER — Telehealth: Payer: Self-pay | Admitting: Hematology & Oncology

## 2014-12-27 ENCOUNTER — Telehealth: Payer: Self-pay | Admitting: Nurse Practitioner

## 2014-12-27 ENCOUNTER — Encounter: Payer: Self-pay | Admitting: Nurse Practitioner

## 2014-12-27 NOTE — Telephone Encounter (Addendum)
Lvm on pt's machine. Rick given note to call and set up.    ----- Message from [name], MD sent at 12/27/2014  9:20 AM EDT ----- Call - ferritin is 97.  i think that she will need 2 more phlebotomies.  Please set up!!  pete

## 2014-12-27 NOTE — Telephone Encounter (Signed)
Left pt message to call for appointment next week. °

## 2014-12-28 DIAGNOSIS — B353 Tinea pedis: Secondary | ICD-10-CM | POA: Diagnosis not present

## 2015-01-02 ENCOUNTER — Ambulatory Visit (HOSPITAL_BASED_OUTPATIENT_CLINIC_OR_DEPARTMENT_OTHER): Payer: Medicare Other

## 2015-01-02 MED ORDER — SODIUM CHLORIDE 0.9 % IV SOLN
Freq: Once | INTRAVENOUS | Status: AC
  Administered 2015-01-02: 11:00:00 via INTRAVENOUS

## 2015-01-02 NOTE — Patient Instructions (Signed)

## 2015-01-02 NOTE — Progress Notes (Signed)
Angela Burgess presents today for phlebotomy per MD orders. Phlebotomy procedure started at 1050 and ended at 1103 500 grams removed. Patient observed for 30 minutes after procedure without any incident. Patient tolerated procedure well. 1L of fluid given post phlebotomy to decrease dizziness.  IV needle removed intact.

## 2015-01-05 DIAGNOSIS — H6123 Impacted cerumen, bilateral: Secondary | ICD-10-CM | POA: Diagnosis not present

## 2015-01-05 DIAGNOSIS — J300 Vasomotor rhinitis: Secondary | ICD-10-CM | POA: Diagnosis not present

## 2015-01-09 ENCOUNTER — Ambulatory Visit (HOSPITAL_BASED_OUTPATIENT_CLINIC_OR_DEPARTMENT_OTHER): Payer: Medicare Other

## 2015-01-09 MED ORDER — SODIUM CHLORIDE 0.9 % IV SOLN
Freq: Once | INTRAVENOUS | Status: AC
  Administered 2015-01-09: 11:00:00 via INTRAVENOUS

## 2015-01-09 NOTE — Progress Notes (Signed)
Angela Burgess presents today for phlebotomy per MD orders. Phlebotomy procedure started at 1100 and ended at 1115. 500 grams removed. Patient observed for 30 minutes after procedure without any incident. Patient tolerated procedure well. IV needle removed intact.

## 2015-01-09 NOTE — Patient Instructions (Signed)

## 2015-01-23 DIAGNOSIS — Z6822 Body mass index (BMI) 22.0-22.9, adult: Secondary | ICD-10-CM | POA: Diagnosis not present

## 2015-01-23 DIAGNOSIS — Z1231 Encounter for screening mammogram for malignant neoplasm of breast: Secondary | ICD-10-CM | POA: Diagnosis not present

## 2015-01-23 DIAGNOSIS — Z124 Encounter for screening for malignant neoplasm of cervix: Secondary | ICD-10-CM | POA: Diagnosis not present

## 2015-02-09 ENCOUNTER — Other Ambulatory Visit (HOSPITAL_BASED_OUTPATIENT_CLINIC_OR_DEPARTMENT_OTHER): Payer: Medicare Other

## 2015-02-09 LAB — IRON AND TIBC
%SAT: 33 % (ref 20–55)
Iron: 82 ug/dL (ref 42–145)
TIBC: 247 ug/dL — ABNORMAL LOW (ref 250–470)
UIBC: 165 ug/dL (ref 125–400)

## 2015-02-09 LAB — COMPREHENSIVE METABOLIC PANEL
ALT: 11 U/L (ref 0–35)
AST: 16 U/L (ref 0–37)
Albumin: 4.1 g/dL (ref 3.5–5.2)
Alkaline Phosphatase: 57 U/L (ref 39–117)
BILIRUBIN TOTAL: 0.3 mg/dL (ref 0.2–1.2)
BUN: 17 mg/dL (ref 6–23)
CHLORIDE: 102 meq/L (ref 96–112)
CO2: 29 mEq/L (ref 19–32)
CREATININE: 0.71 mg/dL (ref 0.50–1.10)
Calcium: 9.6 mg/dL (ref 8.4–10.5)
Glucose, Bld: 99 mg/dL (ref 70–99)
Potassium: 4.3 mEq/L (ref 3.5–5.3)
Sodium: 139 mEq/L (ref 135–145)
Total Protein: 6.9 g/dL (ref 6.0–8.3)

## 2015-02-09 LAB — CBC WITH DIFFERENTIAL (CANCER CENTER ONLY)
BASO#: 0 10*3/uL (ref 0.0–0.2)
BASO%: 0.7 % (ref 0.0–2.0)
EOS%: 1.1 % (ref 0.0–7.0)
Eosinophils Absolute: 0.1 10*3/uL (ref 0.0–0.5)
HCT: 34.5 % — ABNORMAL LOW (ref 34.8–46.6)
HGB: 12 g/dL (ref 11.6–15.9)
LYMPH#: 1.4 10*3/uL (ref 0.9–3.3)
LYMPH%: 30.4 % (ref 14.0–48.0)
MCH: 37.5 pg — ABNORMAL HIGH (ref 26.0–34.0)
MCHC: 34.8 g/dL (ref 32.0–36.0)
MCV: 108 fL — AB (ref 81–101)
MONO#: 0.4 10*3/uL (ref 0.1–0.9)
MONO%: 8 % (ref 0.0–13.0)
NEUT#: 2.7 10*3/uL (ref 1.5–6.5)
NEUT%: 59.8 % (ref 39.6–80.0)
PLATELETS: 259 10*3/uL (ref 145–400)
RBC: 3.2 10*6/uL — ABNORMAL LOW (ref 3.70–5.32)
RDW: 11.2 % (ref 11.1–15.7)
WBC: 4.5 10*3/uL (ref 3.9–10.0)

## 2015-02-09 LAB — FERRITIN: Ferritin: 19 ng/mL (ref 10–291)

## 2015-02-12 ENCOUNTER — Ambulatory Visit: Payer: Medicare Other

## 2015-02-12 ENCOUNTER — Ambulatory Visit (HOSPITAL_BASED_OUTPATIENT_CLINIC_OR_DEPARTMENT_OTHER): Payer: Medicare Other | Admitting: Hematology & Oncology

## 2015-02-12 ENCOUNTER — Other Ambulatory Visit: Payer: Medicare Other

## 2015-02-12 ENCOUNTER — Encounter: Payer: Self-pay | Admitting: Hematology & Oncology

## 2015-02-12 NOTE — Progress Notes (Signed)
No phlebotomy per Dr Marin Olp. Ferritin 19

## 2015-02-12 NOTE — Progress Notes (Signed)
Hematology and Oncology Follow Up Visit  Angela Burgess [id number] Jan 23, 1941 74 y.o. 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  Burgess is back for followup.  She is doing quite well. Her last iron studies looked fantastic. Her ferritin was only 19. Her iron saturation was 33%. This is nice and low. Typically, she's not been down at this level.  Otherwise, she feels well. She's had no nausea vomiting. She's had no arthritic issues. There's no arthralgias. There is no change in bowel or bladder habits. She's had no rashes. There's been no leg swelling.  She will be going to [employer] next week for a great-grandsons eighth-grade graduation. She is looking for to this.  Overall, her performance status is ECOG 0.   Medications:  Current outpatient prescriptions:  .  aspirin 81 MG tablet, Take 81 mg by mouth daily., Disp: , Rfl:  .  Astaxanthin 4 MG CAPS, Take 1 capsule by mouth 2 (two) times daily., Disp: , Rfl:  .  Calcium Carbonate-Vitamin D (CALCIUM 500/D) 500-125 MG-UNIT TABS, Take by mouth.  , Disp: , Rfl:  .  Cholecalciferol (VITAMIN D3) 2000 UNITS TABS, Take by mouth every morning., Disp: , Rfl:  .  estradiol (ESTRACE) 0.1 MG/GM vaginal cream, Place 1 Applicatorful vaginally 2 (two) times a week. , Disp: , Rfl:  .  fish oil-omega-3 fatty acids 1000 MG capsule, Take 2 g by mouth daily.  , Disp: , Rfl:  .  folic acid (FOLVITE) 481 MCG tablet, Take 400 mcg by mouth daily., Disp: , Rfl:  .  levothyroxine (SYNTHROID, LEVOTHROID) 112 MCG tablet, Take 1 tablet (112 mcg total) by mouth daily., Disp: 90 tablet, Rfl: 3 .  metroNIDAZOLE (METROCREAM) 0.75 % cream, Apply 1 application topically daily.  , Disp: , Rfl:  .  Multiple Vitamin (MULTIVITAMIN) tablet, Take 1 tablet by mouth daily.  , Disp: , Rfl:   Allergies:  Allergies  Allergen Reactions  .  Tetracycline Rash    Past Medical History, Surgical history, Social history, and Family History were reviewed and updated.  Review of Systems: As above  Physical Exam:  height is 5\' 4"  (1.626 m) and weight is 131 lb (59.421 kg). Her oral temperature is 98.4 F (36.9 C). Her blood pressure is 126/44 and her pulse is 64. Her respiration is 14.   Well-developed and well-nourished white female. Head and exam shows no ocular or oral lesions. She is no palpable cervical or supraclavicular lymph nodes. Lungs are clear. Cardiac exam regular rate and rhythm with no murmurs, rubs or bruits.. Abdomen is soft. She has good bowel sounds. There is no fluid wave. There is no palpable liver or spleen. Back exam shows no tenderness over the spine, ribs or hips. Extremities shows no clubbing cyanosis or edema. Skin exam shows no rashes, ecchymoses or petechia. Neurological exam with no focal deficits.  Lab Results  Component Value Date   WBC 4.5 02/09/2015   HGB 12.0 02/09/2015   HCT 34.5* 02/09/2015   MCV 108* 02/09/2015   PLT 259 02/09/2015     Chemistry      Component Value Date/Time   NA 139 02/09/2015 1521   K 4.3 02/09/2015 1521   CL 102 02/09/2015 1521   CO2 29 02/09/2015 1521   BUN 17 02/09/2015 1521   CREATININE 0.71 02/09/2015 1521  Component Value Date/Time   CALCIUM 9.6 02/09/2015 1521   ALKPHOS 57 02/09/2015 1521   AST 16 02/09/2015 1521   ALT 11 02/09/2015 1521   BILITOT 0.3 02/09/2015 1521         Impression and Plan: Angela Burgess is 74 year old female with hemachromatosis. She is homozygous for the major  C282Y mutation.  She has been doing well with phlebotomies.her iron levels look great. She does not need to be phlebotomized right now.  We probably get her back in 4 months. This way, she'll have all summer with herself and her family to be oh to travel and not worry about getting lab work. She is quite happy with this.    [name], MD 5/23/201611:50  AM

## 2015-04-19 DIAGNOSIS — L570 Actinic keratosis: Secondary | ICD-10-CM | POA: Diagnosis not present

## 2015-06-13 ENCOUNTER — Other Ambulatory Visit (HOSPITAL_BASED_OUTPATIENT_CLINIC_OR_DEPARTMENT_OTHER): Payer: Medicare Other

## 2015-06-13 LAB — IRON AND TIBC CHCC
Iron: 212 ug/dL — ABNORMAL HIGH (ref 41–142)
TIBC: 200 ug/dL — AB (ref 236–444)
UIBC: <1 ug/dL (ref 120–384)

## 2015-06-13 LAB — FERRITIN CHCC: Ferritin: 50 ng/ml (ref 9–269)

## 2015-06-13 LAB — CBC WITH DIFFERENTIAL (CANCER CENTER ONLY)
BASO#: 0 10*3/uL (ref 0.0–0.2)
BASO%: 0.8 % (ref 0.0–2.0)
EOS%: 2.3 % (ref 0.0–7.0)
Eosinophils Absolute: 0.1 10*3/uL (ref 0.0–0.5)
HCT: 34.5 % — ABNORMAL LOW (ref 34.8–46.6)
HEMOGLOBIN: 12 g/dL (ref 11.6–15.9)
LYMPH#: 1 10*3/uL (ref 0.9–3.3)
LYMPH%: 27.2 % (ref 14.0–48.0)
MCH: 35.8 pg — ABNORMAL HIGH (ref 26.0–34.0)
MCHC: 34.8 g/dL (ref 32.0–36.0)
MCV: 103 fL — AB (ref 81–101)
MONO#: 0.3 10*3/uL (ref 0.1–0.9)
MONO%: 8.2 % (ref 0.0–13.0)
NEUT%: 61.5 % (ref 39.6–80.0)
NEUTROS ABS: 2.2 10*3/uL (ref 1.5–6.5)
Platelets: 189 10*3/uL (ref 145–400)
RBC: 3.35 10*6/uL — AB (ref 3.70–5.32)
RDW: 11.9 % (ref 11.1–15.7)
WBC: 3.5 10*3/uL — AB (ref 3.9–10.0)

## 2015-06-13 LAB — RETICULOCYTES (CHCC)
ABS Retic: 47.6 10*3/uL (ref 19.0–186.0)
RBC.: 3.4 MIL/uL — ABNORMAL LOW (ref 3.87–5.11)
RETIC CT PCT: 1.4 % (ref 0.4–2.3)

## 2015-06-15 ENCOUNTER — Encounter: Payer: Self-pay | Admitting: Hematology & Oncology

## 2015-06-15 ENCOUNTER — Other Ambulatory Visit: Payer: Medicare Other

## 2015-06-15 ENCOUNTER — Ambulatory Visit (HOSPITAL_BASED_OUTPATIENT_CLINIC_OR_DEPARTMENT_OTHER): Payer: Medicare Other | Admitting: Hematology & Oncology

## 2015-06-15 NOTE — Progress Notes (Signed)
Hematology and Oncology Follow Up Visit  Angela Burgess [id number] August 26, 1941 74 y.o. 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  Burgess is back for followup.  She is doing quite well. Her last iron studies looked fantastic. Her ferritin was only 50. Her iron saturation is on the high side. I'm not sure exactly what this means clinically.  Overall,  she feels well. She's had no nausea or  vomiting. She's had no arthritic issues. There's no arthralgias.  she does take an over-the-counter supplement to try to help with arthritis. There is no change in bowel or bladder habits. She's had no rashes. There's been no leg swelling.  She will be going to [place] next month with her husband. Apparently, he has dental work done down there. I did not realize that they had a very good dental profession.   Overall, her performance status is ECOG 0.   Medications:  Current outpatient prescriptions:  .  aspirin 81 MG tablet, Take 81 mg by mouth daily., Disp: , Rfl:  .  Astaxanthin 4 MG CAPS, Take 1 capsule by mouth 2 (two) times daily., Disp: , Rfl:  .  Calcium Carbonate-Vitamin D (CALCIUM 500/D) 500-125 MG-UNIT TABS, Take by mouth.  , Disp: , Rfl:  .  Cholecalciferol (VITAMIN D3) 2000 UNITS TABS, Take by mouth every morning., Disp: , Rfl:  .  estradiol (ESTRACE) 0.1 MG/GM vaginal cream, Place 1 Applicatorful vaginally 2 (two) times a week. , Disp: , Rfl:  .  fish oil-omega-3 fatty acids 1000 MG capsule, Take 2 g by mouth daily.  , Disp: , Rfl:  .  levothyroxine (SYNTHROID, LEVOTHROID) 112 MCG tablet, Take 1 tablet (112 mcg total) by mouth daily., Disp: 90 tablet, Rfl: 3 .  metroNIDAZOLE (METROCREAM) 0.75 % cream, Apply 1 application topically daily.  , Disp: , Rfl:  .  Multiple Vitamin (MULTIVITAMIN) tablet, Take 1 tablet by mouth daily.  , Disp: , Rfl:  .   folic acid (FOLVITE) 283 MCG tablet, Take 400 mcg by mouth daily., Disp: , Rfl:   Allergies:  Allergies  Allergen Reactions  . Tetracycline Rash    Past Medical History, Surgical history, Social history, and Family History were reviewed and updated.  Review of Systems: As above  Physical Exam:  height is 5\' 4"  (1.626 m) and weight is 128 lb (58.06 kg). Her oral temperature is 97.4 F (36.3 C). Her blood pressure is 152/60 and her pulse is 65. Her respiration is 16.   Well-developed and well-nourished white female. Head and exam shows no ocular or oral lesions. She is no palpable cervical or supraclavicular lymph nodes. Lungs are clear. Cardiac exam regular rate and rhythm with no murmurs, rubs or bruits.. Abdomen is soft. She has good bowel sounds. There is no fluid wave. There is no palpable liver or spleen. Back exam shows no tenderness over the spine, ribs or hips. Extremities shows no clubbing cyanosis or edema. Skin exam shows no rashes, ecchymoses or petechia. Neurological exam with no focal deficits.  Lab Results  Component Value Date   WBC 3.5* 06/13/2015   HGB 12.0 06/13/2015   HCT 34.5* 06/13/2015   MCV 103* 06/13/2015   PLT 189 06/13/2015     Chemistry      Component Value Date/Time   NA 139 02/09/2015 1521   K 4.3 02/09/2015 1521  CL 102 02/09/2015 1521   CO2 29 02/09/2015 1521   BUN 17 02/09/2015 1521   CREATININE 0.71 02/09/2015 1521      Component Value Date/Time   CALCIUM 9.6 02/09/2015 1521   ALKPHOS 57 02/09/2015 1521   AST 16 02/09/2015 1521   ALT 11 02/09/2015 1521   BILITOT 0.3 02/09/2015 1521         Impression and Plan: Angela Burgess is 74 year old female with hemachromatosis. She is homozygous for the major  C282Y mutation.  Her ferritin is only 50. As such, we do not have to phlebotomize her.  I think we probably get her back after the holidays now. I'm sure that will we get her back, she probably will need to be phlebotomized.  She and  her husband will be head and down to [place] for vacation I think next month.   [name], MD 9/23/20169:31 AM

## 2015-07-06 DIAGNOSIS — J300 Vasomotor rhinitis: Secondary | ICD-10-CM | POA: Diagnosis not present

## 2015-07-06 DIAGNOSIS — H6123 Impacted cerumen, bilateral: Secondary | ICD-10-CM | POA: Diagnosis not present

## 2015-09-13 ENCOUNTER — Encounter: Payer: Self-pay | Admitting: Internal Medicine

## 2015-09-18 ENCOUNTER — Ambulatory Visit (INDEPENDENT_AMBULATORY_CARE_PROVIDER_SITE_OTHER): Payer: Medicare Other | Admitting: Internal Medicine

## 2015-09-18 ENCOUNTER — Encounter: Payer: Self-pay | Admitting: Internal Medicine

## 2015-09-18 VITALS — BP 134/64 | Temp 97.7°F | Ht 64.0 in | Wt 126.7 lb

## 2015-09-18 DIAGNOSIS — R42 Dizziness and giddiness: Secondary | ICD-10-CM

## 2015-09-18 DIAGNOSIS — Z136 Encounter for screening for cardiovascular disorders: Secondary | ICD-10-CM

## 2015-09-18 DIAGNOSIS — Z833 Family history of diabetes mellitus: Secondary | ICD-10-CM

## 2015-09-18 DIAGNOSIS — E039 Hypothyroidism, unspecified: Secondary | ICD-10-CM

## 2015-09-18 DIAGNOSIS — Z0000 Encounter for general adult medical examination without abnormal findings: Secondary | ICD-10-CM

## 2015-09-18 DIAGNOSIS — E785 Hyperlipidemia, unspecified: Secondary | ICD-10-CM

## 2015-09-18 DIAGNOSIS — E038 Other specified hypothyroidism: Secondary | ICD-10-CM

## 2015-09-18 DIAGNOSIS — M159 Polyosteoarthritis, unspecified: Secondary | ICD-10-CM

## 2015-09-18 DIAGNOSIS — I493 Ventricular premature depolarization: Secondary | ICD-10-CM

## 2015-09-18 DIAGNOSIS — E782 Mixed hyperlipidemia: Secondary | ICD-10-CM

## 2015-09-18 DIAGNOSIS — M5417 Radiculopathy, lumbosacral region: Secondary | ICD-10-CM | POA: Diagnosis not present

## 2015-09-18 DIAGNOSIS — M5416 Radiculopathy, lumbar region: Secondary | ICD-10-CM

## 2015-09-18 DIAGNOSIS — M150 Primary generalized (osteo)arthritis: Secondary | ICD-10-CM

## 2015-09-18 LAB — BASIC METABOLIC PANEL
BUN: 16 mg/dL (ref 6–23)
CHLORIDE: 103 meq/L (ref 96–112)
CO2: 32 meq/L (ref 19–32)
Calcium: 9.5 mg/dL (ref 8.4–10.5)
Creatinine, Ser: 0.7 mg/dL (ref 0.40–1.20)
GFR: 86.67 mL/min (ref >60.00)
GLUCOSE: 88 mg/dL (ref 70–99)
POTASSIUM: 3.9 meq/L (ref 3.5–5.1)
SODIUM: 141 meq/L (ref 135–145)

## 2015-09-18 LAB — CBC WITH DIFFERENTIAL/PLATELET
BASOS PCT: 0.6 % (ref 0.0–3.0)
Basophils Absolute: 0 10*3/uL (ref 0.0–0.1)
EOS PCT: 1.2 % (ref 0.0–5.0)
Eosinophils Absolute: 0 10*3/uL (ref 0.0–0.7)
HEMATOCRIT: 37.6 % (ref 36.0–46.0)
HEMOGLOBIN: 12.7 g/dL (ref 12.0–15.0)
LYMPHS ABS: 1.2 10*3/uL (ref 0.7–4.0)
Lymphocytes Relative: 33.3 % (ref 12.0–46.0)
MCHC: 33.8 g/dL (ref 30.0–36.0)
MCV: 104.4 fl — ABNORMAL HIGH (ref 78.0–100.0)
MONOS PCT: 8.7 % (ref 3.0–12.0)
Monocytes Absolute: 0.3 10*3/uL (ref 0.1–1.0)
NEUTROS ABS: 2.1 10*3/uL (ref 1.4–7.7)
Neutrophils Relative %: 56.2 % (ref 43.0–77.0)
Platelets: 208 10*3/uL (ref 150.0–400.0)
RBC: 3.6 Mil/uL — ABNORMAL LOW (ref 3.87–5.11)
RDW: 12 % (ref 11.5–15.5)
WBC: 3.7 10*3/uL — ABNORMAL LOW (ref 4.0–10.5)

## 2015-09-18 LAB — HEPATIC FUNCTION PANEL
ALK PHOS: 57 U/L (ref 39–117)
ALT: 12 U/L (ref 0–35)
AST: 16 U/L (ref 0–37)
Albumin: 4.1 g/dL (ref 3.5–5.2)
BILIRUBIN TOTAL: 0.6 mg/dL (ref 0.2–1.2)
Bilirubin, Direct: 0.1 mg/dL (ref 0.0–0.3)
Total Protein: 6.7 g/dL (ref 6.0–8.3)

## 2015-09-18 LAB — TSH: TSH: 0.89 u[IU]/mL (ref 0.35–4.50)

## 2015-09-18 LAB — LIPID PANEL
CHOL/HDL RATIO: 3
Cholesterol: 225 mg/dL — ABNORMAL HIGH (ref 0–200)
HDL: 65.8 mg/dL (ref >39.00)
LDL CALC: 144 mg/dL — AB (ref 0–99)
NONHDL: 159.18
Triglycerides: 74 mg/dL (ref 0.0–149.0)
VLDL: 14.8 mg/dL (ref 0.0–40.0)

## 2015-09-18 LAB — HEMOGLOBIN A1C: HEMOGLOBIN A1C: 5.1 % (ref 4.6–6.5)

## 2015-09-18 NOTE — Patient Instructions (Signed)
Continue lifestyle intervention healthy eating and exercise . ekg is ok except fore frequent pvcs  These can be common and normal but  Uncertain if  Important in your case . [name]  Will notify you  of labs when available. Consider   Getting cardiology to see you  Consideration of  Event monitor  To check rhythm of stress teest etc.  Sugar   Up and down could cause sx also.    Health Maintenance, Female Adopting a healthy lifestyle and getting preventive care can go a long way to promote health and wellness. Talk with your health care provider about what schedule of regular examinations is right for you. This is a good chance for you to check in with your provider about disease prevention and staying healthy. In between checkups, there are plenty of things you can do on your own. Experts have done a lot of research about which lifestyle changes and preventive measures are most likely to keep you healthy. Ask your health care provider for more information. WEIGHT AND DIET  Eat a healthy diet  Be sure to include plenty of vegetables, fruits, low-fat dairy products, and lean protein.  Do not eat a lot of foods high in solid fats, added sugars, or salt.  Get regular exercise. This is one of the most important things you can do for your health.  Most adults should exercise for at least 150 minutes each week. The exercise should increase your heart rate and make you sweat (moderate-intensity exercise).  Most adults should also do strengthening exercises at least twice a week. This is in addition to the moderate-intensity exercise.  Maintain a healthy weight  Body mass index (BMI) is a measurement that can be used to identify possible weight problems. It estimates body fat based on height and weight. Your health care provider can help determine your BMI and help you achieve or maintain a healthy weight.  For females 7 years of age and older:   A BMI below 18.5 is considered underweight.  A BMI of  18.5 to 24.9 is normal.  A BMI of 25 to 29.9 is considered overweight.  A BMI of 30 and above is considered obese.  Watch levels of cholesterol and blood lipids  You should start having your blood tested for lipids and cholesterol at 75 years of age, then have this test every 5 years.  You may need to have your cholesterol levels checked more often if:  Your lipid or cholesterol levels are high.  You are older than 75 years of age.  You are at high risk for heart disease.  CANCER SCREENING   Lung Cancer  Lung cancer screening is recommended for adults 52-41 years old who are at high risk for lung cancer because of a history of smoking.  A yearly low-dose CT scan of the lungs is recommended for people who:  Currently smoke.  Have quit within the past 15 years.  Have at least a 30-pack-year history of smoking. A pack year is smoking an average of one pack of cigarettes a day for 1 year.  Yearly screening should continue until it has been 15 years since you quit.  Yearly screening should stop if you develop a health problem that would prevent you from having lung cancer treatment.  Breast Cancer  Practice breast self-awareness. This means understanding how your breasts normally appear and feel.  It also means doing regular breast self-exams. Let your health care provider know about any changes, no  matter how small.  If you are in your 20s or 30s, you should have a clinical breast exam (CBE) by a health care provider every 1-3 years as part of a regular health exam.  If you are 37 or older, have a CBE every year. Also consider having a breast X-ray (mammogram) every year.  If you have a family history of breast cancer, talk to your health care provider about genetic screening.  If you are at high risk for breast cancer, talk to your health care provider about having an MRI and a mammogram every year.  Breast cancer gene (BRCA) assessment is recommended for women who  have family members with BRCA-related cancers. BRCA-related cancers include:  Breast.  Ovarian.  Tubal.  Peritoneal cancers.  Results of the assessment will determine the need for genetic counseling and BRCA1 and BRCA2 testing. Cervical Cancer Your health care provider may recommend that you be screened regularly for cancer of the pelvic organs (ovaries, uterus, and vagina). This screening involves a pelvic examination, including checking for microscopic changes to the surface of your cervix (Pap test). You may be encouraged to have this screening done every 3 years, beginning at age 83.  For women ages 38-65, health care providers may recommend pelvic exams and Pap testing every 3 years, or they may recommend the Pap and pelvic exam, combined with testing for human papilloma virus (HPV), every 5 years. Some types of HPV increase your risk of cervical cancer. Testing for HPV may also be done on women of any age with unclear Pap test results.  Other health care providers may not recommend any screening for nonpregnant women who are considered low risk for pelvic cancer and who do not have symptoms. Ask your health care provider if a screening pelvic exam is right for you.  If you have had past treatment for cervical cancer or a condition that could lead to cancer, you need Pap tests and screening for cancer for at least 20 years after your treatment. If Pap tests have been discontinued, your risk factors (such as having a new sexual partner) need to be reassessed to determine if screening should resume. Some women have medical problems that increase the chance of getting cervical cancer. In these cases, your health care provider may recommend more frequent screening and Pap tests. Colorectal Cancer  This type of cancer can be detected and often prevented.  Routine colorectal cancer screening usually begins at 75 years of age and continues through 75 years of age.  Your health care provider  may recommend screening at an earlier age if you have risk factors for colon cancer.  Your health care provider may also recommend using home test kits to check for hidden blood in the stool.  A small camera at the end of a tube can be used to examine your colon directly (sigmoidoscopy or colonoscopy). This is done to check for the earliest forms of colorectal cancer.  Routine screening usually begins at age 11.  Direct examination of the colon should be repeated every 5-10 years through 75 years of age. However, you may need to be screened more often if early forms of precancerous polyps or small growths are found. Skin Cancer  Check your skin from head to toe regularly.  Tell your health care provider about any new moles or changes in moles, especially if there is a change in a mole's shape or color.  Also tell your health care provider if you have a mole that  is larger than the size of a pencil eraser.  Always use sunscreen. Apply sunscreen liberally and repeatedly throughout the day.  Protect yourself by wearing long sleeves, pants, a wide-brimmed hat, and sunglasses whenever you are outside. HEART DISEASE, DIABETES, AND HIGH BLOOD PRESSURE   High blood pressure causes heart disease and increases the risk of stroke. High blood pressure is more likely to develop in:  People who have blood pressure in the high end of the normal range (130-139/85-89 mm Hg).  People who are overweight or obese.  People who are African American.  If you are 63-3 years of age, have your blood pressure checked every 3-5 years. If you are 41 years of age or older, have your blood pressure checked every year. You should have your blood pressure measured twice--once when you are at a hospital or clinic, and once when you are not at a hospital or clinic. Record the average of the two measurements. To check your blood pressure when you are not at a hospital or clinic, you can use:  An automated blood  pressure machine at a pharmacy.  A home blood pressure monitor.  If you are between 36 years and 29 years old, ask your health care provider if you should take aspirin to prevent strokes.  Have regular diabetes screenings. This involves taking a blood sample to check your fasting blood sugar level.  If you are at a normal weight and have a low risk for diabetes, have this test once every three years after 75 years of age.  If you are overweight and have a high risk for diabetes, consider being tested at a younger age or more often. PREVENTING INFECTION  Hepatitis B  If you have a higher risk for hepatitis B, you should be screened for this virus. You are considered at high risk for hepatitis B if:  You were born in a country where hepatitis B is common. Ask your health care provider which countries are considered high risk.  Your parents were born in a high-risk country, and you have not been immunized against hepatitis B (hepatitis B vaccine).  You have HIV or AIDS.  You use needles to inject street drugs.  You live with someone who has hepatitis B.  You have had sex with someone who has hepatitis B.  You get hemodialysis treatment.  You take certain medicines for conditions, including cancer, organ transplantation, and autoimmune conditions. Hepatitis C  Blood testing is recommended for:  Everyone born from [birth year] through 1966.  Anyone with known risk factors for hepatitis C. Sexually transmitted infections (STIs)  You should be screened for sexually transmitted infections (STIs) including gonorrhea and chlamydia if:  You are sexually active and are younger than 75 years of age.  You are older than 75 years of age and your health care provider tells you that you are at risk for this type of infection.  Your sexual activity has changed since you were last screened and you are at an increased risk for chlamydia or gonorrhea. Ask your health care provider if you are at  risk.  If you do not have HIV, but are at risk, it may be recommended that you take a prescription medicine daily to prevent HIV infection. This is called pre-exposure prophylaxis (PrEP). You are considered at risk if:  You are sexually active and do not regularly use condoms or know the HIV status of your partner(s).  You take drugs by injection.  You are sexually active  with a partner who has HIV. Talk with your health care provider about whether you are at high risk of being infected with HIV. If you choose to begin PrEP, you should first be tested for HIV. You should then be tested every 3 months for as long as you are taking PrEP.  PREGNANCY   If you are premenopausal and you may become pregnant, ask your health care provider about preconception counseling.  If you may become pregnant, take 400 to 800 micrograms (mcg) of folic acid every day.  If you want to prevent pregnancy, talk to your health care provider about birth control (contraception). OSTEOPOROSIS AND MENOPAUSE   Osteoporosis is a disease in which the bones lose minerals and strength with aging. This can result in serious bone fractures. Your risk for osteoporosis can be identified using a bone density scan.  If you are 8 years of age or older, or if you are at risk for osteoporosis and fractures, ask your health care provider if you should be screened.  Ask your health care provider whether you should take a calcium or vitamin D supplement to lower your risk for osteoporosis.  Menopause may have certain physical symptoms and risks.  Hormone replacement therapy may reduce some of these symptoms and risks. Talk to your health care provider about whether hormone replacement therapy is right for you.  HOME CARE INSTRUCTIONS   Schedule regular health, dental, and eye exams.  Stay current with your immunizations.   Do not use any tobacco products including cigarettes, chewing tobacco, or electronic cigarettes.  If  you are pregnant, do not drink alcohol.  If you are breastfeeding, limit how much and how often you drink alcohol.  Limit alcohol intake to no more than 1 drink per day for nonpregnant women. One drink equals 12 ounces of beer, 5 ounces of wine, or 1 ounces of hard liquor.  Do not use street drugs.  Do not share needles.  Ask your health care provider for help if you need support or information about quitting drugs.  Tell your health care provider if you often feel depressed.  Tell your health care provider if you have ever been abused or do not feel safe at home.   This information is not intended to replace advice given to you by your health care provider. Make sure you discuss any questions you have with your health care provider.   Document Released: 03/24/2011 Document Revised: 09/29/2014 Document Reviewed: 08/10/2013 Elsevier Interactive Patient Education [employer].

## 2015-09-18 NOTE — Progress Notes (Signed)
Pre visit review using our clinic review tool, if applicable. No additional management support is needed unless otherwise documented below in the visit note.  Chief Complaint  Patient presents with  . Medicare Wellness    fu meds labs     HPI: Angela Burgess 74 y.o. 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  Battling back pain issues generally well   Health Maintenance  Topic Date Due  . INFLUENZA VACCINE  04/22/2016  . TETANUS/TDAP  11/14/2019  . COLONOSCOPY  07/18/2020  . DEXA SCAN  Completed  . ZOSTAVAX  Completed  . PNA vac Low Risk Adult  Completed   Health Maintenance Review LIFESTYLE:  TAD no see scanned Sugar beverages:no  caffiene Sleep: 9 hours  Exercises planks  Limited aerobic cuase of  Ms   Issues pain  Working on flexibility   MEDICARE DOCUMENT QUESTIONS  TO SCAN   Hearing: ik  Vision:  No limitations at present . Last eye check UTD  Safety:  Has smoke detector and wears seat belts.  No firearms. No excess sun exposure. Sees dentist regularly.  Falls: no  Advance directive :  Reviewed  Has one.  Memory: Felt to be good  , no concern from her or her family.  Depression: No anhedonia unusual crying or depressive symptoms  Nutrition: Eats well balanced diet; adequate calcium and vitamin D. No swallowing chewing problems.  Injury: no major injuries in the last six months.  Other healthcare providers:  Reviewed today .  Social:  Lives with spouse married. No pets.   Preventive parameters: up-to-date  Reviewed   ADLS:   There are no problems or need for assistance  driving, feeding, obtaining food, dressing, toileting and bathing, managing money using phone. She is independent.   ROS:  No syncope  Does but has lightheaded  feeling at church like after doing  phelbotomy for  Her hemochormotosis   fam hx dm happened after high carb breakfast and coffee.  GEN/ HEENT: No fever, significant weight changes sweats headaches vision problems hearing changes, CV/ PULM; No chest pain shortness of breath cough, syncope,edema  change in exercise tolerance.? GI /GU: No adominal pain, vomiting, change in bowel habits. No blood in the stool. No significant GU symptoms. SKIN/HEME: ,no acute skin rashes suspicious lesions or bleeding. No lymphadenopathy, nodules, masses.  NEURO/ PSYCH:  No neurologic signs such as weakness numbness. No depression anxiety. IMM/ Allergy: No unusual infections.  Allergy .   REST of 12 system review negative except as per HPI   Past Medical History  Diagnosis Date  . Hypothyroidism   . Macrocytosis     with theme eval in past and normal b12  . Rosacea   . Cervical cancer ([facility]) 1968  . History of mammogram 1/10  . Tetanus-diphtheria (Td) vaccination 1999  . Anemia   . History of normal resting EKG 2005  . Dexamethasone adverse reaction 2008  . Eye exam abnormal 6/09  . Arthritis   . IBS (irritable bowel syndrome)   . Gastritis   . Osteopenia   . Hyperlipidemia   . Rectocele   . Elevated MCV     for years  . Family history of malignant neoplasm of gastrointestinal tract     Family History  Problem  Relation Age of Onset  . Cirrhosis Mother   . Kidney disease Mother   . Allergy (severe) Mother     protein allergy that sent posion to her brain  . Leukemia Father   . Heart attack Father 40  . Diabetes Father   . Bipolar disorder Sister   . Stroke Sister   . Lung cancer Brother     smoker  . Seizures Brother   . Bipolar disorder Son   . Colon cancer Brother   . Seizures Sister   . Seizures Sister     Social History   Social History  . Marital Status: Married    Spouse Name: N/A  . Number of Children: 2  . Years of Education: N/A   Occupational History  . retired     Social History Main Topics  . Smoking status: Never Smoker   . Smokeless tobacco: Never Used     Comment: never used tobacco  . Alcohol Use: 0.0 oz/week    0 Standard drinks or equivalent per week     Comment: 2 drinks a week   . Drug Use: No  . Sexual Activity: Not Asked   Other Topics Concern  . 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 aspirin 81 MG tablet Take 81 mg by mouth daily.  . Astaxanthin 4 MG CAPS Take 1 capsule by mouth 2 (two) times daily.  . Calcium Carbonate-Vitamin D (CALCIUM 500/D) 500-125 MG-UNIT TABS Take by mouth.    . Cholecalciferol (VITAMIN D3) 2000 UNITS TABS Take by mouth every morning.  [name] estradiol (ESTRACE) 0.1 MG/GM vaginal cream Place 1 Applicatorful vaginally 2 (two) times a week.   . fish oil-omega-3 fatty acids 1000 MG capsule Take 2 g by mouth daily.    . folic acid (FOLVITE) 637 MCG tablet Take 400 mcg by mouth daily.  [name] levothyroxine (SYNTHROID, LEVOTHROID) 112 MCG tablet Take 1 tablet (112 mcg total) by mouth daily.  . metroNIDAZOLE (METROCREAM) 0.75 % cream Apply 1 application topically daily.    . Multiple Vitamin (MULTIVITAMIN) tablet Take 1 tablet by mouth daily.     No facility-administered encounter medications on file as of 09/18/2015.    EXAM:  BP 134/64 mmHg  Temp(Src) 97.7 F (36.5 C) (Oral)  Ht 5' 4"  (1.626 m)  Wt 126 lb 11.2 oz (57.471 kg)  BMI 21.74 kg/m2  Body mass index is 21.74 kg/(m^2).  Physical Exam: Vital signs reviewed CHY:IFOY is a well-developed well-nourished alert cooperative   who appears stated age in no acute distress.  HEENT: normocephalic atraumatic , Eyes: PERRL EOM's full, conjunctiva clear, Nares: paten,t no deformity discharge or tenderness., Ears: no deformity EAC's clear TMs with normal landmarks. Mouth: clear OP, no lesions, edema.  Moist mucous  membranes. Dentition in adequate repair. NECK: supple without masses, thyromegaly or bruits. CHEST/PULM:  Clear to auscultation and percussion breath sounds equal no wheeze , rales or rhonchi. No chest wall deformities or tenderness. CV: PMI is nondisplaced, S1 S2 no gallops, murmurs, rubs. ocass premature beat .  Peripheral pulses are full without delay.No JVD . Breast: normal by inspection . No dimpling, discharge, masses, tenderness or discharge . ABDOMEN: Bowel sounds normal nontender  No guard or rebound, no hepato splenomegal no CVA tenderness.   Extremtities:  No clubbing cyanosis or edema, no acute joint swelling or redness no focal atrophy NEURO:  Oriented x3, cranial nerves 3-12 appear to be intact, no obvious focal weakness,gait within normal limits no abnormal reflexes or asymmetrical SKIN: No acute rashes normal turgor, color, no bruising or petechiae. PSYCH: Oriented, good eye contact, no obvious depression anxiety, cognition and judgment appear normal. LN: no cervical axillary inguinal adenopathy No noted deficits in memory, attention, and speech. ekg  Frequent pvcs    No acut findings otherwise   Lab Results  Component Value Date   WBC 3.7* 09/18/2015   HGB 12.7 09/18/2015   HCT 37.6 09/18/2015   PLT 208.0 09/18/2015   GLUCOSE 88 09/18/2015   CHOL 225* 09/18/2015   TRIG 74.0 09/18/2015   HDL 65.80 09/18/2015   LDLDIRECT 165.2 07/21/2012   LDLCALC 144* 09/18/2015   ALT 12 09/18/2015   AST 16 09/18/2015   NA 141 09/18/2015   K 3.9 09/18/2015   CL 103 09/18/2015   CREATININE 0.70 09/18/2015   BUN 16 09/18/2015   CO2 32 09/18/2015   TSH 0.89 09/18/2015   HGBA1C 5.1 09/18/2015    ASSESSMENT AND PLAN:  Discussed the following assessment and plan:  Visit for preventive health examination - Plan: Basic metabolic panel, CBC with Differential/Platelet, Hemoglobin A1c, Hepatic function panel, Lipid panel, TSH, EKG 12-Lead  Medicare annual wellness visit,  subsequent  Hyperlipidemia - Plan: Basic metabolic panel, CBC with Differential/Platelet, Hemoglobin A1c, Hepatic function panel, Lipid panel, TSH, EKG 12-Lead  Right lumbar radiculitis - Plan: Basic metabolic panel, CBC with Differential/Platelet, Hemoglobin A1c, Hepatic function panel, Lipid panel, TSH  Hemochromatosis - Plan: Basic metabolic panel, CBC with Differential/Platelet, Hemoglobin A1c, Hepatic function panel, Lipid panel, TSH  Hypothyroidism, unspecified hypothyroidism type - Plan: Basic metabolic panel, CBC with Differential/Platelet, Hemoglobin A1c, Hepatic function panel, Lipid panel, TSH  Intermittent lightheadedness - post phelboytoimy and once at church can do satic exercise wo problem  - Plan: Basic metabolic panel, CBC with Differential/Platelet, Hemoglobin A1c, Hepatic function panel, Lipid panel, TSH, EKG 12-Lead  Screening for cardiovascular condition - Plan: Basic metabolic panel, CBC with Differential/Platelet, Hemoglobin A1c, Hepatic function panel, Lipid panel, TSH, EKG 12-Lead  Family history of diabetes mellitus - Plan: Basic metabolic panel, CBC with Differential/Platelet, Hemoglobin A1c, Hepatic function panel, Lipid panel, TSH  Preventative health care  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  Other specified hypothyroidism  HYPERLIPIDEMIA - gh hdl  Other irritable bowel syndrome  Primary osteoarthritis involving multiple joints  Disorder of bone and cartilage  Elevated MCV - eval in past thyroid b12 etc stable  hemochormotosis   Homozygous mutation goa; 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 ekg is ok except fore frequent pvcs  These can be common and normal but  Uncertain if  Important in your case . [name]  Will notify you  of labs when available. Consider   Getting cardiology to see you  Consideration of  Event monitor  To check rhythm of stress teest etc.  Sugar   Up and down could cause sx also.    Health Maintenance, Female Adopting a healthy lifestyle and getting preventive care can go a long way to promote health and wellness. Talk with your health care provider about what schedule of regular examinations is right for you. This is a good chance for you to check in with your provider about disease prevention and staying healthy. In between checkups, there are plenty of things you can do on your own. Experts have done a lot of research about which lifestyle changes and preventive measures are most likely to keep you healthy. Ask your health care provider for more information. WEIGHT AND DIET  Eat a healthy diet  Be sure to include plenty of vegetables, fruits, low-fat dairy products, and lean protein.  Do not eat a lot of foods high in solid fats, added sugars, or salt.  Get regular exercise. This is one of the most important things you can do for your health.  Most adults should exercise for at least 150 minutes each week. The exercise should increase your heart rate and make you sweat (moderate-intensity exercise).  Most adults should also do strengthening exercises at least twice a week. This is in addition to the moderate-intensity exercise.  Maintain a healthy weight  Body mass index (BMI) is a measurement that can be used to identify possible weight problems. It estimates body fat based on height and weight. Your health care provider can help determine your BMI and help you achieve or maintain a healthy weight.  For females 80 years of age and older:    A BMI below 18.5 is considered underweight.  A BMI of 18.5 to 24.9 is normal.  A BMI of 25 to 29.9 is considered overweight.  A BMI of 30 and above is considered obese.  Watch levels of cholesterol and blood lipids  You should start having your blood tested for lipids and cholesterol at 75 years of age, then have this test every 5 years.  You may need to have your cholesterol levels checked more often if:  Your lipid or cholesterol levels are high.  You are older than 75 years of age.  You are at high risk for heart disease.  CANCER SCREENING   Lung Cancer  Lung cancer screening is recommended for adults 4-41 years old who are at high risk for lung cancer because of a history of smoking.  A yearly low-dose CT scan of the lungs is recommended for people who:  Currently smoke.  Have quit within the past 15 years.  Have at least a 30-pack-year history of smoking. A pack year is smoking an average of one pack of cigarettes a day for 1 year.  Yearly screening should continue until it has been 15 years since you quit.  Yearly screening should stop if you develop a health problem that would prevent you from having lung cancer treatment.  Breast Cancer  Practice breast self-awareness. This means understanding how your breasts normally appear and feel.  It also means doing regular breast self-exams. Let your health care provider know about any changes, no matter how small.  If you are in your 20s or 30s, you should have a clinical breast exam (CBE) by a health care provider every 1-3 years as part of a regular health exam.  If you are 47 or older, have a CBE every year. Also consider having a breast X-ray (mammogram) every year.  If you have a family history of breast  cancer, talk to your health care provider about genetic screening.  If you are at high risk for breast cancer, talk to your health care provider about having an MRI and a mammogram every year.  Breast  cancer gene (BRCA) assessment is recommended for women who have family members with BRCA-related cancers. BRCA-related cancers include:  Breast.  Ovarian.  Tubal.  Peritoneal cancers.  Results of the assessment will determine the need for genetic counseling and BRCA1 and BRCA2 testing. Cervical Cancer Your health care provider may recommend that you be screened regularly for cancer of the pelvic organs (ovaries, uterus, and vagina). This screening involves a pelvic examination, including checking for microscopic changes to the surface of your cervix (Pap test). You may be encouraged to have this screening done every 3 years, beginning at age 73.  For women ages 78-65, health care providers may recommend pelvic exams and Pap testing every 3 years, or they may recommend the Pap and pelvic exam, combined with testing for human papilloma virus (HPV), every 5 years. Some types of HPV increase your risk of cervical cancer. Testing for HPV may also be done on women of any age with unclear Pap test results.  Other health care providers may not recommend any screening for nonpregnant women who are considered low risk for pelvic cancer and who do not have symptoms. Ask your health care provider if a screening pelvic exam is right for you.  If you have had past treatment for cervical cancer or a condition that could lead to cancer, you need Pap tests and screening for cancer for at least 20 years after your treatment. If Pap tests have been discontinued, your risk factors (such as having a new sexual partner) need to be reassessed to determine if screening should resume. Some women have medical problems that increase the chance of getting cervical cancer. In these cases, your health care provider may recommend more frequent screening and Pap tests. Colorectal Cancer  This type of cancer can be detected and often prevented.  Routine colorectal cancer screening usually begins at 75 years of age and  continues through 75 years of age.  Your health care provider may recommend screening at an earlier age if you have risk factors for colon cancer.  Your health care provider may also recommend using home test kits to check for hidden blood in the stool.  A small camera at the end of a tube can be used to examine your colon directly (sigmoidoscopy or colonoscopy). This is done to check for the earliest forms of colorectal cancer.  Routine screening usually begins at age 36.  Direct examination of the colon should be repeated every 5-10 years through 75 years of age. However, you may need to be screened more often if early forms of precancerous polyps or small growths are found. Skin Cancer  Check your skin from head to toe regularly.  Tell your health care provider about any new moles or changes in moles, especially if there is a change in a mole's shape or color.  Also tell your health care provider if you have a mole that is larger than the size of a pencil eraser.  Always use sunscreen. Apply sunscreen liberally and repeatedly throughout the day.  Protect yourself by wearing long sleeves, pants, a wide-brimmed hat, and sunglasses whenever you are outside. HEART DISEASE, DIABETES, AND HIGH BLOOD PRESSURE   High blood pressure causes heart disease and increases the risk of stroke. High blood pressure is more likely  to develop in:  People who have blood pressure in the high end of the normal range (130-139/85-89 mm Hg).  People who are overweight or obese.  People who are African American.  If you are 14-37 years of age, have your blood pressure checked every 3-5 years. If you are 70 years of age or older, have your blood pressure checked every year. You should have your blood pressure measured twice--once when you are at a hospital or clinic, and once when you are not at a hospital or clinic. Record the average of the two measurements. To check your blood pressure when you are not at  a hospital or clinic, you can use:  An automated blood pressure machine at a pharmacy.  A home blood pressure monitor.  If you are between 1 years and 21 years old, ask your health care provider if you should take aspirin to prevent strokes.  Have regular diabetes screenings. This involves taking a blood sample to check your fasting blood sugar level.  If you are at a normal weight and have a low risk for diabetes, have this test once every three years after 75 years of age.  If you are overweight and have a high risk for diabetes, consider being tested at a younger age or more often. PREVENTING INFECTION  Hepatitis B  If you have a higher risk for hepatitis B, you should be screened for this virus. You are considered at high risk for hepatitis B if:  You were born in a country where hepatitis B is common. Ask your health care provider which countries are considered high risk.  Your parents were born in a high-risk country, and you have not been immunized against hepatitis B (hepatitis B vaccine).  You have HIV or AIDS.  You use needles to inject street drugs.  You live with someone who has hepatitis B.  You have had sex with someone who has hepatitis B.  You get hemodialysis treatment.  You take certain medicines for conditions, including cancer, organ transplantation, and autoimmune conditions. Hepatitis C  Blood testing is recommended for:  Everyone born from [birth year] through 1966.  Anyone with known risk factors for hepatitis C. Sexually transmitted infections (STIs)  You should be screened for sexually transmitted infections (STIs) including gonorrhea and chlamydia if:  You are sexually active and are younger than 75 years of age.  You are older than 75 years of age and your health care provider tells you that you are at risk for this type of infection.  Your sexual activity has changed since you were last screened and you are at an increased risk for chlamydia or  gonorrhea. Ask your health care provider if you are at risk.  If you do not have HIV, but are at risk, it may be recommended that you take a prescription medicine daily to prevent HIV infection. This is called pre-exposure prophylaxis (PrEP). You are considered at risk if:  You are sexually active and do not regularly use condoms or know the HIV status of your partner(s).  You take drugs by injection.  You are sexually active with a partner who has HIV. Talk with your health care provider about whether you are at high risk of being infected with HIV. If you choose to begin PrEP, you should first be tested for HIV. You should then be tested every 3 months for as long as you are taking PrEP.  PREGNANCY   If you are premenopausal and you may become  pregnant, ask your health care provider about preconception counseling.  If you may become pregnant, take 400 to 800 micrograms (mcg) of folic acid every day.  If you want to prevent pregnancy, talk to your health care provider about birth control (contraception). OSTEOPOROSIS AND MENOPAUSE   Osteoporosis is a disease in which the bones lose minerals and strength with aging. This can result in serious bone fractures. Your risk for osteoporosis can be identified using a bone density scan.  If you are 9 years of age or older, or if you are at risk for osteoporosis and fractures, ask your health care provider if you should be screened.  Ask your health care provider whether you should take a calcium or vitamin D supplement to lower your risk for osteoporosis.  Menopause may have certain physical symptoms and risks.  Hormone replacement therapy may reduce some of these symptoms and risks. Talk to your health care provider about whether hormone replacement therapy is right for you.  HOME CARE INSTRUCTIONS   Schedule regular health, dental, and eye exams.  Stay current with your immunizations.   Do not use any tobacco products including  cigarettes, chewing tobacco, or electronic cigarettes.  If you are pregnant, do not drink alcohol.  If you are breastfeeding, limit how much and how often you drink alcohol.  Limit alcohol intake to no more than 1 drink per day for nonpregnant women. One drink equals 12 ounces of beer, 5 ounces of wine, or 1 ounces of hard liquor.  Do not use street drugs.  Do not share needles.  Ask your health care provider for help if you need support or information about quitting drugs.  Tell your health care provider if you often feel depressed.  Tell your health care provider if you have ever been abused or do not feel safe at home.   This information is not intended to replace advice given to you by your health care provider. Make sure you discuss any questions you have with your health care provider.   Document Released: 03/24/2011 Document Revised: 09/29/2014 Document Reviewed: 08/10/2013 Elsevier Interactive Patient Education 2016 Melvin K. Dyneisha Murchison M.D.

## 2015-10-17 ENCOUNTER — Other Ambulatory Visit (HOSPITAL_BASED_OUTPATIENT_CLINIC_OR_DEPARTMENT_OTHER): Payer: Medicare Other

## 2015-10-17 LAB — COMPREHENSIVE METABOLIC PANEL
ALT: 13 U/L (ref 0–55)
ANION GAP: 6 meq/L (ref 3–11)
AST: 18 U/L (ref 5–34)
Albumin: 3.9 g/dL (ref 3.5–5.0)
Alkaline Phosphatase: 63 U/L (ref 40–150)
BUN: 14.2 mg/dL (ref 7.0–26.0)
CHLORIDE: 105 meq/L (ref 98–109)
CO2: 29 meq/L (ref 22–29)
CREATININE: 0.8 mg/dL (ref 0.6–1.1)
Calcium: 9.5 mg/dL (ref 8.4–10.4)
EGFR: 72 mL/min/{1.73_m2} — ABNORMAL LOW (ref >90)
Glucose: 100 mg/dl (ref 70–140)
Potassium: 4.2 mEq/L (ref 3.5–5.1)
Sodium: 140 mEq/L (ref 136–145)
Total Bilirubin: 0.49 mg/dL (ref 0.20–1.20)
Total Protein: 6.9 g/dL (ref 6.4–8.3)

## 2015-10-17 LAB — CBC WITH DIFFERENTIAL (CANCER CENTER ONLY)
BASO#: 0 10*3/uL (ref 0.0–0.2)
BASO%: 0.5 % (ref 0.0–2.0)
EOS ABS: 0 10*3/uL (ref 0.0–0.5)
EOS%: 1 % (ref 0.0–7.0)
HCT: 34.8 % (ref 34.8–46.6)
HEMOGLOBIN: 12.4 g/dL (ref 11.6–15.9)
LYMPH#: 1.2 10*3/uL (ref 0.9–3.3)
LYMPH%: 28.9 % (ref 14.0–48.0)
MCH: 36.4 pg — AB (ref 26.0–34.0)
MCHC: 35.6 g/dL (ref 32.0–36.0)
MCV: 102 fL — ABNORMAL HIGH (ref 81–101)
MONO#: 0.3 10*3/uL (ref 0.1–0.9)
MONO%: 7.3 % (ref 0.0–13.0)
NEUT#: 2.5 10*3/uL (ref 1.5–6.5)
NEUT%: 62.3 % (ref 39.6–80.0)
PLATELETS: 208 10*3/uL (ref 145–400)
RBC: 3.41 10*6/uL — ABNORMAL LOW (ref 3.70–5.32)
RDW: 11.2 % (ref 11.1–15.7)
WBC: 4 10*3/uL (ref 3.9–10.0)

## 2015-10-17 LAB — FERRITIN: Ferritin: 42 ng/ml (ref 9–269)

## 2015-10-17 LAB — IRON AND TIBC
%SAT: 91 % — AB (ref 21–57)
IRON: 187 ug/dL — AB (ref 41–142)
TIBC: 206 ug/dL — ABNORMAL LOW (ref 236–444)
UIBC: 19 ug/dL — ABNORMAL LOW (ref 120–384)

## 2015-10-18 ENCOUNTER — Telehealth: Payer: Self-pay | Admitting: *Deleted

## 2015-10-18 NOTE — Telephone Encounter (Addendum)
Patient aware of results.   ----- Message from [name], MD sent at 10/17/2015  5:28 PM EST ----- Call - ferritin is only 42!!  This is great!!  No need for a phlebotomy!!!  Happy New Year!!  pete

## 2015-10-19 ENCOUNTER — Encounter: Payer: Self-pay | Admitting: Hematology & Oncology

## 2015-10-19 ENCOUNTER — Ambulatory Visit (HOSPITAL_BASED_OUTPATIENT_CLINIC_OR_DEPARTMENT_OTHER): Payer: Medicare Other | Admitting: Hematology & Oncology

## 2015-10-19 NOTE — Progress Notes (Signed)
Hematology and Oncology Follow Up Visit  Angela Burgess [id number] 05-Dec-1940 75 y.o. 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  She is doing quite well. Her last iron studies looked fantastic. Her ferritin was only 50. Her iron saturation is on the high side. I'm not sure exactly what this means clinically.  Overall,  she feels well. She's had no nausea or  vomiting. She's had no arthritic issues. There's no arthralgias.  she does take an over-the-counter supplement to try to help with arthritis. There is no change in bowel or bladder habits. She's had no rashes. There's been no leg swelling.   she and her husband had a good holiday season. They really did not go anywhere. Family came to visit them.   They're supposed to go down to [place] after we saw him last in September. They do not do that. They're going down there for dental care.  Overall, her performance status is ECOG 0.   Medications:  Current outpatient prescriptions:  .  aspirin 81 MG tablet, Take 81 mg by mouth daily., Disp: , Rfl:  .  Astaxanthin 4 MG CAPS, Take 1 capsule by mouth 2 (two) times daily., Disp: , Rfl:  .  Calcium Carbonate-Vitamin D (CALCIUM 500/D) 500-125 MG-UNIT TABS, Take by mouth.  , Disp: , Rfl:  .  Cholecalciferol (VITAMIN D3) 2000 UNITS TABS, Take by mouth every morning., Disp: , Rfl:  .  estradiol (ESTRACE) 0.1 MG/GM vaginal cream, Place 1 Applicatorful vaginally 2 (two) times a week. , Disp: , Rfl:  .  fish oil-omega-3 fatty acids 1000 MG capsule, Take 2 g by mouth daily.  , Disp: , Rfl:  .  folic acid (FOLVITE) A999333 MCG tablet, Take 400 mcg by mouth daily., Disp: , Rfl:  .  levothyroxine (SYNTHROID, LEVOTHROID) 112 MCG tablet, Take 1 tablet (112 mcg total) by mouth daily., Disp: 90 tablet, Rfl: 3 .  metroNIDAZOLE (METROCREAM)  0.75 % cream, Apply 1 application topically daily.  , Disp: , Rfl:  .  Multiple Vitamin (MULTIVITAMIN) tablet, Take 1 tablet by mouth daily.  , Disp: , Rfl:   Allergies:  Allergies  Allergen Reactions  . Tetracycline Rash    Past Medical History, Surgical history, Social history, and Family History were reviewed and updated.  Review of Systems: As above  Physical Exam:  height is 5\' 4"  (1.626 m) and weight is 130 lb (58.968 kg). Her oral temperature is 97.4 F (36.3 C). Her blood pressure is 126/36 and her pulse is 60. Her respiration is 16.   Well-developed and well-nourished white female. Head and exam shows no ocular or oral lesions. She is no palpable cervical or supraclavicular lymph nodes. Lungs are clear. Cardiac exam regular rate and rhythm with no murmurs, rubs or bruits.. Abdomen is soft. She has good bowel sounds. There is no fluid wave. There is no palpable liver or spleen. Back exam shows no tenderness over the spine, ribs or hips. Extremities shows no clubbing cyanosis or edema. Skin exam shows no rashes, ecchymoses or petechia. Neurological exam with no focal deficits.  Lab Results  Component Value Date   WBC 4.0 10/17/2015   HGB 12.4 10/17/2015   HCT 34.8 10/17/2015   MCV 102* 10/17/2015   PLT 208 10/17/2015     Chemistry  Component Value Date/Time   NA 140 10/17/2015 1006   NA 141 09/18/2015 1122   K 4.2 10/17/2015 1006   K 3.9 09/18/2015 1122   CL 103 09/18/2015 1122   CO2 29 10/17/2015 1006   CO2 32 09/18/2015 1122   BUN 14.2 10/17/2015 1006   BUN 16 09/18/2015 1122   CREATININE 0.8 10/17/2015 1006   CREATININE 0.70 09/18/2015 1122      Component Value Date/Time   CALCIUM 9.5 10/17/2015 1006   CALCIUM 9.5 09/18/2015 1122   ALKPHOS 63 10/17/2015 1006   ALKPHOS 57 09/18/2015 1122   AST 18 10/17/2015 1006   AST 16 09/18/2015 1122   ALT 13 10/17/2015 1006   ALT 12 09/18/2015 1122   BILITOT 0.49 10/17/2015 1006   BILITOT 0.6 09/18/2015 1122          Impression and Plan: Angela Burgess is 75 year old female with hemachromatosis. She is homozygous for the major  C282Y mutation.  Her ferritin is only 42.  As such, we do not have to phlebotomize her.  I think we probably get her back  In 3 months. We will have her come in sooner for lab work and then when we see her back, we will have the results. [name], MD 1/27/201710:38 AM

## 2015-11-20 ENCOUNTER — Other Ambulatory Visit: Payer: Self-pay | Admitting: Internal Medicine

## 2015-11-20 NOTE — Telephone Encounter (Signed)
Sent to the pharmacy by e-scribe. 

## 2015-11-23 DIAGNOSIS — H5203 Hypermetropia, bilateral: Secondary | ICD-10-CM | POA: Diagnosis not present

## 2015-11-23 DIAGNOSIS — H04123 Dry eye syndrome of bilateral lacrimal glands: Secondary | ICD-10-CM | POA: Diagnosis not present

## 2015-11-23 DIAGNOSIS — H2513 Age-related nuclear cataract, bilateral: Secondary | ICD-10-CM | POA: Diagnosis not present

## 2015-11-23 DIAGNOSIS — Z135 Encounter for screening for eye and ear disorders: Secondary | ICD-10-CM | POA: Diagnosis not present

## 2015-12-24 DIAGNOSIS — M19241 Secondary osteoarthritis, right hand: Secondary | ICD-10-CM | POA: Diagnosis not present

## 2015-12-24 DIAGNOSIS — M19271 Secondary osteoarthritis, right ankle and foot: Secondary | ICD-10-CM | POA: Diagnosis not present

## 2015-12-24 DIAGNOSIS — M160 Bilateral primary osteoarthritis of hip: Secondary | ICD-10-CM | POA: Diagnosis not present

## 2016-01-11 ENCOUNTER — Other Ambulatory Visit: Payer: Medicare Other

## 2016-01-14 ENCOUNTER — Other Ambulatory Visit (HOSPITAL_BASED_OUTPATIENT_CLINIC_OR_DEPARTMENT_OTHER): Payer: Medicare Other

## 2016-01-14 LAB — COMPREHENSIVE METABOLIC PANEL
ALT: 13 U/L (ref 0–55)
AST: 20 U/L (ref 5–34)
Albumin: 4 g/dL (ref 3.5–5.0)
Alkaline Phosphatase: 64 U/L (ref 40–150)
Anion Gap: 7 mEq/L (ref 3–11)
BUN: 17 mg/dL (ref 7.0–26.0)
CHLORIDE: 105 meq/L (ref 98–109)
CO2: 29 meq/L (ref 22–29)
CREATININE: 0.8 mg/dL (ref 0.6–1.1)
Calcium: 9.8 mg/dL (ref 8.4–10.4)
EGFR: 75 mL/min/{1.73_m2} — ABNORMAL LOW (ref >90)
Glucose: 86 mg/dl (ref 70–140)
Potassium: 4.1 mEq/L (ref 3.5–5.1)
Sodium: 141 mEq/L (ref 136–145)
Total Bilirubin: 0.54 mg/dL (ref 0.20–1.20)
Total Protein: 7.1 g/dL (ref 6.4–8.3)

## 2016-01-14 LAB — CBC WITH DIFFERENTIAL (CANCER CENTER ONLY)
BASO#: 0 10*3/uL (ref 0.0–0.2)
BASO%: 0.5 % (ref 0.0–2.0)
EOS ABS: 0.1 10*3/uL (ref 0.0–0.5)
EOS%: 1.6 % (ref 0.0–7.0)
HCT: 36.2 % (ref 34.8–46.6)
HGB: 13.1 g/dL (ref 11.6–15.9)
LYMPH#: 1 10*3/uL (ref 0.9–3.3)
LYMPH%: 26 % (ref 14.0–48.0)
MCH: 37.5 pg — AB (ref 26.0–34.0)
MCHC: 36.2 g/dL — ABNORMAL HIGH (ref 32.0–36.0)
MCV: 104 fL — AB (ref 81–101)
MONO#: 0.3 10*3/uL (ref 0.1–0.9)
MONO%: 8.6 % (ref 0.0–13.0)
NEUT#: 2.4 10*3/uL (ref 1.5–6.5)
NEUT%: 63.3 % (ref 39.6–80.0)
PLATELETS: 202 10*3/uL (ref 145–400)
RBC: 3.49 10*6/uL — AB (ref 3.70–5.32)
RDW: 11 % — AB (ref 11.1–15.7)
WBC: 3.7 10*3/uL — AB (ref 3.9–10.0)

## 2016-01-14 LAB — IRON AND TIBC
%SAT: 83 % — AB (ref 21–57)
Iron: 187 ug/dL — ABNORMAL HIGH (ref 41–142)
TIBC: 226 ug/dL — ABNORMAL LOW (ref 236–444)
UIBC: 39 ug/dL — AB (ref 120–384)

## 2016-01-14 LAB — FERRITIN: Ferritin: 41 ng/ml (ref 9–269)

## 2016-01-15 ENCOUNTER — Encounter: Payer: Self-pay | Admitting: Nurse Practitioner

## 2016-01-18 ENCOUNTER — Ambulatory Visit (HOSPITAL_BASED_OUTPATIENT_CLINIC_OR_DEPARTMENT_OTHER): Payer: Medicare Other | Admitting: Hematology & Oncology

## 2016-01-18 ENCOUNTER — Encounter: Payer: Self-pay | Admitting: Hematology & Oncology

## 2016-01-18 NOTE — Progress Notes (Signed)
Hematology and Oncology Follow Up Visit  Angela Burgess [id number] Feb 29, 1941 75 y.o. 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  Burgess is back for followup.  She is doing quite well. She and her husband are still quite busy.  With her last iron studies, I think we have to try to keep her ferritin below 50. Her iron saturation is on the high side. I think we need to try to bring that down a little bit more. I explained this to her. She understands.   She's had no problems with bleeding. His been no change in bowel or bladder habits.   She's had no cough or shortness of breath. She's had no fever. She's had no rashes. She's had no joint swelling.   Overall, her performance status is ECOG 0.   Medications:  Current outpatient prescriptions:  .  aspirin 81 MG tablet, Take 81 mg by mouth daily., Disp: , Rfl:  .  Astaxanthin 4 MG CAPS, Take 1 capsule by mouth 2 (two) times daily., Disp: , Rfl:  .  Calcium Carbonate-Vitamin D (CALCIUM 500/D) 500-125 MG-UNIT TABS, Take by mouth.  , Disp: , Rfl:  .  Cholecalciferol (VITAMIN D3) 2000 UNITS TABS, Take by mouth every morning., Disp: , Rfl:  .  estradiol (ESTRACE) 0.1 MG/GM vaginal cream, Place 1 Applicatorful vaginally 2 (two) times a week. , Disp: , Rfl:  .  fish oil-omega-3 fatty acids 1000 MG capsule, Take 2 g by mouth daily.  , Disp: , Rfl:  .  levothyroxine (SYNTHROID, LEVOTHROID) 112 MCG tablet, TAKE 1 TABLET BY MOUTH EVERY DAY, Disp: 90 tablet, Rfl: 2 .  metroNIDAZOLE (METROCREAM) 0.75 % cream, Apply 1 application topically daily.  , Disp: , Rfl:  .  Multiple Vitamin (MULTIVITAMIN) tablet, Take 1 tablet by mouth daily.  , Disp: , Rfl:   Allergies:  Allergies  Allergen Reactions  . Tetracycline Rash    Past Medical History, Surgical history, Social history, and Family History were reviewed and  updated.  Review of Systems: As above  Physical Exam:  height is 5\' 4"  (1.626 m) and weight is 129 lb (58.514 kg). Her oral temperature is 97.6 F (36.4 C). Her blood pressure is 135/55 and her pulse is 63. Her respiration is 18.   Well-developed and well-nourished white female. Head and exam shows no ocular or oral lesions. She is no palpable cervical or supraclavicular lymph nodes. Lungs are clear. Cardiac exam regular rate and rhythm with no murmurs, rubs or bruits.. Abdomen is soft. She has good bowel sounds. There is no fluid wave. There is no palpable liver or spleen. Back exam shows no tenderness over the spine, ribs or hips. Extremities shows no clubbing cyanosis or edema. Skin exam shows no rashes, ecchymoses or petechia. Neurological exam with no focal deficits.  Lab Results  Component Value Date   WBC 3.7* 01/14/2016   HGB 13.1 01/14/2016   HCT 36.2 01/14/2016   MCV 104* 01/14/2016   PLT 202 01/14/2016     Chemistry      Component Value Date/Time   NA 141 01/14/2016 1034   NA 141 09/18/2015 1122   K 4.1 01/14/2016 1034   K 3.9 09/18/2015 1122   CL 103 09/18/2015 1122   CO2 29 01/14/2016 1034   CO2 32 09/18/2015 1122   BUN 17.0 01/14/2016 1034  BUN 16 09/18/2015 1122   CREATININE 0.8 01/14/2016 1034   CREATININE 0.70 09/18/2015 1122      Component Value Date/Time   CALCIUM 9.8 01/14/2016 1034   CALCIUM 9.5 09/18/2015 1122   ALKPHOS 64 01/14/2016 1034   ALKPHOS 57 09/18/2015 1122   AST 20 01/14/2016 1034   AST 16 09/18/2015 1122   ALT 13 01/14/2016 1034   ALT 12 09/18/2015 1122   BILITOT 0.54 01/14/2016 1034   BILITOT 0.6 09/18/2015 1122         Impression and Plan: Angela Burgess is 75 year old female with hemachromatosis. She is homozygous for the major  C282Y mutation.  Her ferritin is only 42.  As such, we do not have to phlebotomize her.  I do want to get her back in 2 months. Again I think we be a little more aggressive with phlebotomizing her and  making sure that her ferritin is below 50 so we try to bring down her iron saturation.  [name], MD 4/28/201710:33 AM

## 2016-02-04 DIAGNOSIS — H6123 Impacted cerumen, bilateral: Secondary | ICD-10-CM | POA: Diagnosis not present

## 2016-03-04 DIAGNOSIS — Z1231 Encounter for screening mammogram for malignant neoplasm of breast: Secondary | ICD-10-CM | POA: Diagnosis not present

## 2016-03-04 DIAGNOSIS — Z13820 Encounter for screening for osteoporosis: Secondary | ICD-10-CM | POA: Diagnosis not present

## 2016-03-04 DIAGNOSIS — N958 Other specified menopausal and perimenopausal disorders: Secondary | ICD-10-CM | POA: Diagnosis not present

## 2016-03-04 DIAGNOSIS — Z124 Encounter for screening for malignant neoplasm of cervix: Secondary | ICD-10-CM | POA: Diagnosis not present

## 2016-03-04 DIAGNOSIS — Z01419 Encounter for gynecological examination (general) (routine) without abnormal findings: Secondary | ICD-10-CM | POA: Diagnosis not present

## 2016-03-04 DIAGNOSIS — Z6821 Body mass index (BMI) 21.0-21.9, adult: Secondary | ICD-10-CM | POA: Diagnosis not present

## 2016-03-14 ENCOUNTER — Other Ambulatory Visit (HOSPITAL_BASED_OUTPATIENT_CLINIC_OR_DEPARTMENT_OTHER): Payer: Medicare Other

## 2016-03-14 LAB — COMPREHENSIVE METABOLIC PANEL
ALBUMIN: 3.8 g/dL (ref 3.5–5.0)
ALK PHOS: 58 U/L (ref 40–150)
ALT: 13 U/L (ref 0–55)
AST: 16 U/L (ref 5–34)
Anion Gap: 8 mEq/L (ref 3–11)
BILIRUBIN TOTAL: 0.55 mg/dL (ref 0.20–1.20)
BUN: 16 mg/dL (ref 7.0–26.0)
CALCIUM: 9.6 mg/dL (ref 8.4–10.4)
CO2: 29 mEq/L (ref 22–29)
CREATININE: 0.8 mg/dL (ref 0.6–1.1)
Chloride: 104 mEq/L (ref 98–109)
EGFR: 72 mL/min/{1.73_m2} — ABNORMAL LOW (ref >90)
Glucose: 107 mg/dl (ref 70–140)
Potassium: 4.3 mEq/L (ref 3.5–5.1)
Sodium: 141 mEq/L (ref 136–145)
TOTAL PROTEIN: 6.7 g/dL (ref 6.4–8.3)

## 2016-03-14 LAB — CBC WITH DIFFERENTIAL (CANCER CENTER ONLY)
BASO#: 0 10*3/uL (ref 0.0–0.2)
BASO%: 0.9 % (ref 0.0–2.0)
EOS%: 1.8 % (ref 0.0–7.0)
Eosinophils Absolute: 0.1 10*3/uL (ref 0.0–0.5)
HCT: 35.5 % (ref 34.8–46.6)
HGB: 12.6 g/dL (ref 11.6–15.9)
LYMPH#: 1 10*3/uL (ref 0.9–3.3)
LYMPH%: 30.1 % (ref 14.0–48.0)
MCH: 36.8 pg — ABNORMAL HIGH (ref 26.0–34.0)
MCHC: 35.5 g/dL (ref 32.0–36.0)
MCV: 104 fL — AB (ref 81–101)
MONO#: 0.3 10*3/uL (ref 0.1–0.9)
MONO%: 8.2 % (ref 0.0–13.0)
NEUT#: 2 10*3/uL (ref 1.5–6.5)
NEUT%: 59 % (ref 39.6–80.0)
PLATELETS: 186 10*3/uL (ref 145–400)
RBC: 3.42 10*6/uL — ABNORMAL LOW (ref 3.70–5.32)
RDW: 11.3 % (ref 11.1–15.7)
WBC: 3.4 10*3/uL — ABNORMAL LOW (ref 3.9–10.0)

## 2016-03-14 LAB — IRON AND TIBC
%SAT: 85 % — ABNORMAL HIGH (ref 21–57)
Iron: 178 ug/dL — ABNORMAL HIGH (ref 41–142)
TIBC: 209 ug/dL — AB (ref 236–444)
UIBC: 31 ug/dL — ABNORMAL LOW (ref 120–384)

## 2016-03-14 LAB — FERRITIN: Ferritin: 49 ng/ml (ref 9–269)

## 2016-03-17 ENCOUNTER — Telehealth: Payer: Self-pay | Admitting: *Deleted

## 2016-03-17 NOTE — Telephone Encounter (Addendum)
Patient aware of results. Appointment made.   ----- Message from [name], MD sent at 03/14/2016  4:49 PM EDT ----- Call - the iron saturation is too high!!  She needs a phlebotomy!!!  Please set this up!!!  pete

## 2016-03-21 ENCOUNTER — Other Ambulatory Visit: Payer: Self-pay | Admitting: Family

## 2016-03-21 ENCOUNTER — Ambulatory Visit (HOSPITAL_BASED_OUTPATIENT_CLINIC_OR_DEPARTMENT_OTHER): Payer: Medicare Other | Admitting: Hematology & Oncology

## 2016-03-21 ENCOUNTER — Encounter: Payer: Self-pay | Admitting: Hematology & Oncology

## 2016-03-21 ENCOUNTER — Ambulatory Visit (HOSPITAL_BASED_OUTPATIENT_CLINIC_OR_DEPARTMENT_OTHER): Payer: Medicare Other

## 2016-03-21 MED ORDER — SODIUM CHLORIDE 0.9 % IV SOLN
Freq: Once | INTRAVENOUS | Status: AC
  Administered 2016-03-21: 12:00:00 via INTRAVENOUS

## 2016-03-21 NOTE — Progress Notes (Signed)
Hematology and Oncology Follow Up Visit  FRANZISKA STRENGTH [id number] [birth year] 75 y.o. 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  Goetze is back for followup.  Unfortunately, I think we will have to be more aggressive with her phlebotomies. She still has an incredibly high iron saturation. I did this is directly related to the fact that she is homozygous for the major hemochromatosis gene.  I think we really have to drive down her iron. I really want to keep her iron saturation below 50% and in order to that, I think we will will have to keep her ferritin below 25.  She has not had any proms otherwise. She has a low bit of headache. She does feel a little bit "foggy" when she first gets up.  Her husband is having problems with his prostate. His only she will need to have a prostate TURP in the next few weeks.  She has had no fever. She has had no cough. There's been no change in bowel or bladder habits.  She had a mammogram 2 weeks ago.she reports this as being fine.  Overall, her performance status is ECOG 0.   Medications:  Current outpatient prescriptions:  .  aspirin 81 MG tablet, Take 81 mg by mouth daily., Disp: , Rfl:  .  Astaxanthin 4 MG CAPS, Take 1 capsule by mouth 2 (two) times daily., Disp: , Rfl:  .  Calcium Carbonate-Vitamin D (CALCIUM 500/D) 500-125 MG-UNIT TABS, Take by mouth.  , Disp: , Rfl:  .  Cholecalciferol (VITAMIN D3) 2000 UNITS TABS, Take by mouth every morning., Disp: , Rfl:  .  estradiol (ESTRACE) 0.1 MG/GM vaginal cream, Place 1 Applicatorful vaginally 2 (two) times a week. , Disp: , Rfl:  .  fish oil-omega-3 fatty acids 1000 MG capsule, Take 2 g by mouth daily.  , Disp: , Rfl:  .  levothyroxine (SYNTHROID, LEVOTHROID) 112 MCG tablet, TAKE 1 TABLET BY MOUTH EVERY DAY, Disp: 90 tablet, Rfl: 2 .   metroNIDAZOLE (METROCREAM) 0.75 % cream, Apply 1 application topically daily.  , Disp: , Rfl:  .  Multiple Vitamin (MULTIVITAMIN) tablet, Take 1 tablet by mouth daily.  , Disp: , Rfl:   Allergies:  Allergies  Allergen Reactions  . Tetracycline Rash    Past Medical History, Surgical history, Social history, and Family History were reviewed and updated.  Review of Systems: As above  Physical Exam:  height is 5\' 4"  (1.626 m) and weight is 128 lb 1.9 oz (58.115 kg). Her oral temperature is 97.7 F (36.5 C). Her blood pressure is 151/53 and her pulse is 66. Her respiration is 16.   Well-developed and well-nourished white female. Head and exam shows no ocular or oral lesions. She is no palpable cervical or supraclavicular lymph nodes. Lungs are clear. Cardiac exam regular rate and rhythm with no murmurs, rubs or bruits.. Abdomen is soft. She has good bowel sounds. There is no fluid wave. There is no palpable liver or spleen. Back exam shows no tenderness over the spine, ribs or hips. Extremities shows no clubbing cyanosis or edema. Skin exam shows no rashes, ecchymoses or petechia. Neurological exam with no focal deficits.  Lab Results  Component Value Date   WBC 3.4* 03/14/2016   HGB 12.6 03/14/2016   HCT 35.5 03/14/2016   MCV 104* 03/14/2016  PLT 186 03/14/2016     Chemistry      Component Value Date/Time   NA 141 03/14/2016 0942   NA 141 09/18/2015 1122   K 4.3 03/14/2016 0942   K 3.9 09/18/2015 1122   CL 103 09/18/2015 1122   CO2 29 03/14/2016 0942   CO2 32 09/18/2015 1122   BUN 16.0 03/14/2016 0942   BUN 16 09/18/2015 1122   CREATININE 0.8 03/14/2016 0942   CREATININE 0.70 09/18/2015 1122      Component Value Date/Time   CALCIUM 9.6 03/14/2016 0942   CALCIUM 9.5 09/18/2015 1122   ALKPHOS 58 03/14/2016 0942   ALKPHOS 57 09/18/2015 1122   AST 16 03/14/2016 0942   AST 16 09/18/2015 1122   ALT 13 03/14/2016 0942   ALT 12 09/18/2015 1122   BILITOT 0.55 03/14/2016 0942    BILITOT 0.6 09/18/2015 1122         Impression and Plan: Ms. Landeck is 75 year old female with hemachromatosis. She is homozygous for the major  C282Y mutation.  Again, we will have to make a change in our protocol for her. I really want to keep her iron saturation down below 50%. We had to keep her ferritin below 25. We will phlebotomize her for the next 3 weeks. This will be in addition to her today.  I will then check her iron studies in about 3 weeks.  I want see her back in 4 weeks.  I spent about 25 minutes with her today explaining our change in management. She understands very well.  [name], MD 6/30/201710:49 AM

## 2016-03-21 NOTE — Progress Notes (Signed)
Angela Burgess presents today for phlebotomy per MD orders. Phlebotomy procedure started at 1100 and ended at 1115. 500 ml removed. Patient observed for 30 minutes after procedure without any incident. Patient tolerated procedure well. IV needle removed intact.

## 2016-03-21 NOTE — Patient Instructions (Signed)

## 2016-03-28 ENCOUNTER — Ambulatory Visit (HOSPITAL_BASED_OUTPATIENT_CLINIC_OR_DEPARTMENT_OTHER): Payer: Medicare Other

## 2016-03-28 ENCOUNTER — Other Ambulatory Visit (HOSPITAL_BASED_OUTPATIENT_CLINIC_OR_DEPARTMENT_OTHER): Payer: Medicare Other

## 2016-03-28 LAB — CBC WITH DIFFERENTIAL (CANCER CENTER ONLY)
BASO#: 0 10*3/uL (ref 0.0–0.2)
BASO%: 0.5 % (ref 0.0–2.0)
EOS%: 1 % (ref 0.0–7.0)
Eosinophils Absolute: 0 10*3/uL (ref 0.0–0.5)
HCT: 30.5 % — ABNORMAL LOW (ref 34.8–46.6)
HGB: 10.7 g/dL — ABNORMAL LOW (ref 11.6–15.9)
LYMPH#: 1.1 10*3/uL (ref 0.9–3.3)
LYMPH%: 27.8 % (ref 14.0–48.0)
MCH: 37 pg — AB (ref 26.0–34.0)
MCHC: 35.1 g/dL (ref 32.0–36.0)
MCV: 106 fL — ABNORMAL HIGH (ref 81–101)
MONO#: 0.4 10*3/uL (ref 0.1–0.9)
MONO%: 8.8 % (ref 0.0–13.0)
NEUT#: 2.5 10*3/uL (ref 1.5–6.5)
NEUT%: 61.9 % (ref 39.6–80.0)
PLATELETS: 208 10*3/uL (ref 145–400)
RBC: 2.89 10*6/uL — AB (ref 3.70–5.32)
RDW: 11.4 % (ref 11.1–15.7)
WBC: 4.1 10*3/uL (ref 3.9–10.0)

## 2016-03-28 MED ORDER — SODIUM CHLORIDE 0.9 % IV SOLN
Freq: Once | INTRAVENOUS | Status: AC
  Administered 2016-03-28: 15:00:00 via INTRAVENOUS

## 2016-03-28 NOTE — Patient Instructions (Signed)

## 2016-04-04 ENCOUNTER — Ambulatory Visit (HOSPITAL_BASED_OUTPATIENT_CLINIC_OR_DEPARTMENT_OTHER): Payer: Medicare Other

## 2016-04-04 ENCOUNTER — Other Ambulatory Visit (HOSPITAL_BASED_OUTPATIENT_CLINIC_OR_DEPARTMENT_OTHER): Payer: Medicare Other

## 2016-04-04 LAB — CBC WITH DIFFERENTIAL (CANCER CENTER ONLY)
BASO#: 0 10*3/uL (ref 0.0–0.2)
BASO%: 0.4 % (ref 0.0–2.0)
EOS%: 1.4 % (ref 0.0–7.0)
Eosinophils Absolute: 0.1 10*3/uL (ref 0.0–0.5)
HCT: 29.2 % — ABNORMAL LOW (ref 34.8–46.6)
HEMOGLOBIN: 10.2 g/dL — AB (ref 11.6–15.9)
LYMPH#: 1 10*3/uL (ref 0.9–3.3)
LYMPH%: 20.9 % (ref 14.0–48.0)
MCH: 37.4 pg — ABNORMAL HIGH (ref 26.0–34.0)
MCHC: 34.9 g/dL (ref 32.0–36.0)
MCV: 107 fL — ABNORMAL HIGH (ref 81–101)
MONO#: 0.5 10*3/uL (ref 0.1–0.9)
MONO%: 9.2 % (ref 0.0–13.0)
NEUT%: 68.1 % (ref 39.6–80.0)
NEUTROS ABS: 3.3 10*3/uL (ref 1.5–6.5)
Platelets: 237 10*3/uL (ref 145–400)
RBC: 2.73 10*6/uL — AB (ref 3.70–5.32)
RDW: 12 % (ref 11.1–15.7)
WBC: 4.9 10*3/uL (ref 3.9–10.0)

## 2016-04-04 MED ORDER — SODIUM CHLORIDE 0.9 % IV SOLN
1000.0000 mL | Freq: Once | INTRAVENOUS | Status: AC
  Administered 2016-04-04: 1000 mL via INTRAVENOUS

## 2016-04-04 NOTE — Progress Notes (Signed)
Angela Burgess presents today for phlebotomy per MD orders. Phlebotomy procedure started at 1340 and ended at 1401 500 grams removed. Patient observed for 30 minutes after procedure without any incident. Patient tolerated procedure well. 1L of IVF given post phlebotomy for fluid replacement as ordered.  IV needle removed intact.

## 2016-04-04 NOTE — Patient Instructions (Signed)
Therapeutic Phlebotomy, Care After Refer to this sheet in the next few weeks. These instructions provide you with information about caring for yourself after your procedure. Your health care provider may also give you more specific instructions. Your treatment has been planned according to current medical practices, but problems sometimes occur. Call your health care provider if you have any problems or questions after your procedure. WHAT TO EXPECT AFTER THE PROCEDURE After your procedure, it is common to have:  Light-headedness or dizziness. You may feel faint.  Nausea.  Tiredness. HOME CARE INSTRUCTIONS Activities  Return to your normal activities as directed by your health care provider. Most people can go back to their normal activities right away.  Avoid strenuous physical activity and heavy lifting or pulling for about 5 hours after the procedure. Do not lift anything that is heavier than 10 lb (4.5 kg).  Athletes should avoid strenuous exercise for at least 12 hours.  Change positions slowly for the remainder of the day. This will help to prevent light-headedness or fainting.  If you feel light-headed, lie down until the feeling goes away. Eating and Drinking  Be sure to eat well-balanced meals for the next 24 hours.  Drink enough fluid to keep your urine clear or pale yellow.  Avoid drinking alcohol on the day that you had the procedure. Care of the Needle Insertion Site  Keep your bandage dry. You can remove the bandage after about 5 hours or as directed by your health care provider.  If you have bleeding from the needle insertion site, elevate your arm and press firmly on the site until the bleeding stops.  If you have bruising at the site, apply ice to the area:  Put ice in a plastic bag.  Place a towel between your skin and the bag.  Leave the ice on for 20 minutes, 2-3 times a day for the first 24 hours.  If the swelling does not go away after 24 hours,  apply a warm, moist washcloth to the area for 20 minutes, 2-3 times a day. General Instructions  Avoid smoking for at least 30 minutes after the procedure.  Keep all follow-up visits as directed by your health care provider. It is important to continue with further therapeutic phlebotomy treatments as directed. SEEK MEDICAL CARE IF:  You have redness, swelling, or pain at the needle insertion site.  You have fluid, blood, or pus coming from the needle insertion site.  You feel light-headed, dizzy, or nauseated, and the feeling does not go away.  You notice new bruising at the needle insertion site.  You feel weaker than normal.  You have a fever or chills. SEEK IMMEDIATE MEDICAL CARE IF:  You have severe nausea or vomiting.  You have chest pain.  You have trouble breathing.   This information is not intended to replace advice given to you by your health care provider. Make sure you discuss any questions you have with your health care provider.   Document Released: 02/10/2011 Document Revised: 01/23/2015 Document Reviewed: 09/04/2014 Elsevier Interactive Patient Education 2016 [place].  Fluid Replacement  Dehydration means your body does not have as much fluid or water as it needs. It happens when you take in less fluid than you lose. Your kidneys, brain, and heart will not work properly without the right amount of fluids.  Dehydration can range from mild to severe. It should be treated right away to help prevent it from becoming severe. HOME CARE  Drink enough fluid  to keep your pee (urine) clear or pale yellow.  Drink water or fluid slowly by taking small sips. You can also try sucking on ice cubes.  Have food or drinks that contain electrolytes. Examples include bananas and sports drinks.  Take over-the-counter and prescription medicines only as told by your doctor.  Prepare oral rehydration solution (ORS) according to the instructions that came with it. Take sips  of ORS every 5 minutes until your pee returns to normal.  If you are throwing up (vomiting) or have watery poop (diarrhea), keep trying to drink water, ORS, or both.  If you have watery poop, avoid:  Drinks with caffeine.  Fruit juice.  Milk.  Carbonated soft drinks.  Do not take salt tablets. This can lead to having too much sodium in your body (hypernatremia). GET HELP IF:  You cannot eat or drink without throwing up.  You have had mild watery poop for longer than 24 hours.  You have a fever. GET HELP RIGHT AWAY IF:   You have very strong thirst.  You have very bad watery poop.  You have not peed in 6-8 hours, or you have peed only a small amount of very dark pee.  You have shriveled skin.  You are dizzy, confused, or both.   This information is not intended to replace advice given to you by your health care provider. Make sure you discuss any questions you have with your health care provider.   Document Released: 07/05/2009 Document Revised: 05/30/2015 Document Reviewed: 01/24/2015 Elsevier Interactive Patient Education [employer].

## 2016-04-16 ENCOUNTER — Other Ambulatory Visit (HOSPITAL_BASED_OUTPATIENT_CLINIC_OR_DEPARTMENT_OTHER): Payer: Medicare Other

## 2016-04-16 LAB — CBC WITH DIFFERENTIAL (CANCER CENTER ONLY)
BASO#: 0.1 10*3/uL (ref 0.0–0.2)
BASO%: 1.3 % (ref 0.0–2.0)
EOS ABS: 0.1 10*3/uL (ref 0.0–0.5)
EOS%: 1.3 % (ref 0.0–7.0)
HEMATOCRIT: 29.2 % — AB (ref 34.8–46.6)
HEMOGLOBIN: 10 g/dL — AB (ref 11.6–15.9)
LYMPH#: 1.1 10*3/uL (ref 0.9–3.3)
LYMPH%: 28.3 % (ref 14.0–48.0)
MCH: 36.8 pg — AB (ref 26.0–34.0)
MCHC: 34.2 g/dL (ref 32.0–36.0)
MCV: 107 fL — AB (ref 81–101)
MONO#: 0.3 10*3/uL (ref 0.1–0.9)
MONO%: 8.9 % (ref 0.0–13.0)
NEUT%: 60.2 % (ref 39.6–80.0)
NEUTROS ABS: 2.3 10*3/uL (ref 1.5–6.5)
Platelets: 273 10*3/uL (ref 145–400)
RBC: 2.72 10*6/uL — ABNORMAL LOW (ref 3.70–5.32)
RDW: 11.7 % (ref 11.1–15.7)
WBC: 3.8 10*3/uL — ABNORMAL LOW (ref 3.9–10.0)

## 2016-04-17 LAB — IRON AND TIBC
%SAT: 23 % (ref 21–57)
IRON: 60 ug/dL (ref 41–142)
TIBC: 259 ug/dL (ref 236–444)
UIBC: 199 ug/dL (ref 120–384)

## 2016-04-17 LAB — FERRITIN: FERRITIN: 12 ng/mL (ref 9–269)

## 2016-04-21 ENCOUNTER — Encounter: Payer: Self-pay | Admitting: Hematology & Oncology

## 2016-04-21 ENCOUNTER — Ambulatory Visit (HOSPITAL_BASED_OUTPATIENT_CLINIC_OR_DEPARTMENT_OTHER): Payer: Medicare Other | Admitting: Hematology & Oncology

## 2016-04-21 NOTE — Progress Notes (Signed)
Hematology and Oncology Follow Up Visit  Angela Burgess [id number] Nov 24, 1940 75 y.o. 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  Burgess is back for followup.  She is doing great. We went ahead and phlebotomize Angela 3 weeks in a row. I really want to get Angela iron levels down. We succeeded very nicely. On July 26, Angela ferritin was 12 with an iron saturation low at 23%.  She feels okay. She got Angela blood count down quite low but this also is improving.  She's had no problems with bowels or bladder. She has had no cough. She's had no rashes.   Angela Burgess, unfortunately, has I think either prostate cancer or bladder cancer.   Overall, Angela performance status is ECOG 0.   Medications:  Current Outpatient Prescriptions:  .  aspirin 81 MG tablet, Take 81 mg by mouth daily., Disp: , Rfl:  .  Astaxanthin 4 MG CAPS, Take 1 capsule by mouth 2 (two) times daily., Disp: , Rfl:  .  Calcium Carbonate-Vitamin D (CALCIUM 500/D) 500-125 MG-UNIT TABS, Take by mouth.  , Disp: , Rfl:  .  Cholecalciferol (VITAMIN D3) 2000 UNITS TABS, Take by mouth every morning., Disp: , Rfl:  .  estradiol (ESTRACE) 0.1 MG/GM vaginal cream, Place 1 Applicatorful vaginally 2 (two) times a week. , Disp: , Rfl:  .  fish oil-omega-3 fatty acids 1000 MG capsule, Take 2 g by mouth daily.  , Disp: , Rfl:  .  levothyroxine (SYNTHROID, LEVOTHROID) 112 MCG tablet, TAKE 1 TABLET BY MOUTH EVERY DAY, Disp: 90 tablet, Rfl: 2 .  metroNIDAZOLE (METROCREAM) 0.75 % cream, Apply 1 application topically daily.  , Disp: , Rfl:  .  Multiple Vitamin (MULTIVITAMIN) tablet, Take 1 tablet by mouth daily.  , Disp: , Rfl:   Allergies:  Allergies  Allergen Reactions  . Tetracycline Rash    Past Medical History, Surgical history, Social history, and Family History were reviewed and  updated.  Review of Systems: As above  Physical Exam:  height is 5\' 4"  (1.626 m) and weight is 127 lb (57.6 kg). Angela oral temperature is 97.6 F (36.4 C). Angela blood pressure is 125/53 (abnormal) and Angela pulse is 69. Angela respiration is 18.   Well-developed and well-nourished white female. Head and exam shows no ocular or oral lesions. She is no palpable cervical or supraclavicular lymph nodes. Lungs are clear. Cardiac exam regular rate and rhythm with no murmurs, rubs or bruits.. Abdomen is soft. She has good bowel sounds. There is no fluid wave. There is no palpable liver or spleen. Back exam shows no tenderness over the spine, ribs or hips. Extremities shows no clubbing cyanosis or edema. Skin exam shows no rashes, ecchymoses or petechia. Neurological exam with no focal deficits.  Lab Results  Component Value Date   WBC 3.8 (L) 04/16/2016   HGB 10.0 (L) 04/16/2016   HCT 29.2 (L) 04/16/2016   MCV 107 (H) 04/16/2016   PLT 273 04/16/2016     Chemistry      Component Value Date/Time   NA 141 03/14/2016 0942   K 4.3 03/14/2016 0942   CL 103 09/18/2015 1122   CO2 29 03/14/2016 0942   BUN 16.0 03/14/2016 0942   CREATININE 0.8 03/14/2016 0942      Component Value Date/Time   CALCIUM 9.6  03/14/2016 0942   ALKPHOS 58 03/14/2016 0942   AST 16 03/14/2016 0942   ALT 13 03/14/2016 0942   BILITOT 0.55 03/14/2016 0942         Impression and Plan: [name] is 75 year old female with hemachromatosis. She is homozygous for the major  C282Y mutation.  We will continue to be aggressive with Angela iron removal.  I really want to keep Angela iron saturation down below 50%. We had to keep Angela ferritin below 25.  I believe that we can go ahead and get Angela back in 3 months now. I think this would be reasonable. [name] [name], MD 7/31/201712:10 PM

## 2016-05-12 ENCOUNTER — Encounter: Payer: Self-pay | Admitting: Physical Therapy

## 2016-05-12 ENCOUNTER — Ambulatory Visit (INDEPENDENT_AMBULATORY_CARE_PROVIDER_SITE_OTHER): Payer: Medicare Other | Admitting: Physical Therapy

## 2016-05-12 DIAGNOSIS — M25651 Stiffness of right hip, not elsewhere classified: Secondary | ICD-10-CM

## 2016-05-12 DIAGNOSIS — M25652 Stiffness of left hip, not elsewhere classified: Secondary | ICD-10-CM

## 2016-05-12 NOTE — Patient Instructions (Addendum)
Gastroc Stretch   [place] [phone] , do all stretches on both sides       Stand with right foot back, leg straight, forward leg bent. Keeping heel on floor, turned slightly out, lean into wall until stretch is felt in calf. Hold __30-45__ seconds. Repeat __1__ times per set. Do __1__ sets per session. Do __1__ sessions per day.  Soleus Stretch    Stand with right foot back, both knees bent. Keeping heel on floor, turned slightly out, lean into wall until stretch is felt in lower calf. Hold __30-45__ seconds. Repeat ___1_ times per set. Do __1__ sets per session. Do __1__ sessions per day.   Butterfly, Supine    Lie on back, feet together. Lower knees toward floor. Hold _30-45__ seconds. Repeat _1__ times per session. Do _1__ sessions per day.   Supine Knee-to-Chest, Unilateral - can use a strap under your knee to help with stretch     Lie on back, hands clasped behind one knee. Pull knee in toward chest until a comfortable stretch is felt in lower back and buttocks. Hold _30-45__ seconds.  Repeat __1_ times per session. Do __1_ sessions per day.  Quads / HF, Lunge    Kneel in deep lunge, behind leg on floor. Push pelvis down slowly while slightly arching back until stretch is felt on front of hip. Hold _30-45__ seconds. Repeat __1_ times per session. Do 1___ sessions per day.  Hip External Rotator With Hip Flexed 90 Degrees    Wrap strap around left foot, hold in same-side hand, leg bent 90/90, other leg bent, foot on floor. While pressing raised knee inward, pull foot outward. Hold _30-45__ seconds. Relax leg and return to start, keeping leg 90/90. Repeat __1_ times, each leg, 1 time a day.       Trigger Point Dry Needling  . What is Trigger Point Dry Needling (DN)? o DN is a physical therapy technique used to treat muscle pain and dysfunction. Specifically, DN helps deactivate muscle trigger points (muscle knots).  o A thin filiform needle is used to penetrate  the skin and stimulate the underlying trigger point. The goal is for a local twitch response (LTR) to occur and for the trigger point to relax. No medication of any kind is injected during the procedure.   . What Does Trigger Point Dry Needling Feel Like?  o The procedure feels different for each individual patient. Some patients report that they do not actually feel the needle enter the skin and overall the process is not painful. Very mild bleeding may occur. However, many patients feel a deep cramping in the muscle in which the needle was inserted. This is the local twitch response.   [name] How Will I feel after the treatment? o Soreness is normal, and the onset of soreness may not occur for a few hours. Typically this soreness does not last longer than two days.  o Bruising is uncommon, however; ice can be used to decrease any possible bruising.  o In rare cases feeling tired or nauseous after the treatment is normal. In addition, your symptoms may get worse before they get better, this period will typically not last longer than 24 hours.   . What Can I do After My Treatment? o Increase your hydration by drinking more water for the next 24 hours. o You may place ice or heat on the areas treated that have become sore, however, do not use heat on inflamed or bruised areas. Heat often brings more  relief post needling. o You can continue your regular activities, but vigorous activity is not recommended initially after the treatment for 24 hours. o DN is best combined with other physical therapy such as strengthening, stretching, and other therapies.

## 2016-05-12 NOTE — Therapy (Signed)
[place], [place], [address] Phone: [phone]   Fax:  [phone]  Physical Therapy Evaluation  Patient Details  Name: Angela Burgess MRN: [id number] Date of Birth: 76-13-1942 Referring Provider: Eliezer Lofts PA  Encounter Date: 05/12/2016      PT End of Session - 05/12/16 0940    Visit Number 1   Number of Visits 4   Date for PT Re-Evaluation 06/09/16   PT Start Time 0940   PT Stop Time 1030   PT Time Calculation (min) 50 min   Activity Tolerance Patient tolerated treatment well      Past Medical History:  Diagnosis Date  . Anemia   . Arthritis   . Cervical cancer ([place]) 1968  . Dexamethasone adverse reaction 2008  . Elevated MCV    for years  . Eye exam abnormal 6/09  . Family history of malignant neoplasm of gastrointestinal tract   . Gastritis   . History of mammogram 1/10  . History of normal resting EKG 2005  . Hyperlipidemia   . Hypothyroidism   . IBS (irritable bowel syndrome)   . Macrocytosis    with theme eval in past and normal b12  . Osteopenia   . Rectocele   . Rosacea   . Tetanus-diphtheria (Td) vaccination 1999    Past Surgical History:  Procedure Laterality Date  . ABDOMINAL HYSTERECTOMY    . APPENDECTOMY    . BREAST LUMPECTOMY      There were no vitals filed for this visit.       Subjective Assessment - 05/12/16 0935    Subjective Pt reports  she has a spot in her back that has been bothering her for a long time.  She has been doing her HEP from before and the PA sent her back as she wants to know what is safe and unsafe to perform now.  She is taking [employer] currently.  States her knees don't bother her and the hand OA.    Pertinent History osteopenia, Hemochromatosis ( iron deficiency)    How long can you sit comfortably? tightness in low back after sitting and transitioning to stand.    How long can you walk comfortably? only problem with walking  up hill.    Patient Stated Goals learn and make sure she is doing exercise that are safe for her, bend over and work in her garden easier.    Currently in Pain? No/denies            [facility] PT Assessment - 05/12/16 0001      Assessment   Medical Diagnosis lumbar spondylolithesis, bilat knee pain, hand pain   Referring Provider Eliezer Lofts PA   Onset Date/Surgical Date 01/11/16  at her last check up with MD mentioned it    Hand Dominance Right   Next MD Visit as needed   Prior Therapy for hips, a couple yrs ago     Precautions   Precautions Other (comment)   Precaution Comments adverse rxn to dexamethasone     Balance Screen   Has the patient fallen in the past 6 months No   Has the patient had a decrease in activity level because of a fear of falling?  No   Is the patient reluctant to leave their home because of a fear of falling?  No     Home Environment   Living Environment Private residence   Home Layout Two level  no trouble  Prior Function   Level of Independence Independent   Vocation Retired   Leisure tia chi, go out to eat and movies, work in garden     Observation/Other Assessments   Focus on Therapeutic Outcomes (FOTO)  41% limited     Functional Tests   Functional tests Squat;Single leg stance     Squat   Comments bilat LE ER and feet pronation     Single Leg Stance   Comments bilat WNL     Posture/Postural Control   Posture/Postural Control No significant limitations     ROM / Strength   AROM / PROM / Strength AROM;Strength     AROM   AROM Assessment Site Lumbar;Wrist;Finger;Ankle;Hip   Right/Left Wrist --  bilat WNL   Right/Left Finger --  bilat WNL   Right/Left Hip Right;Left  flexion Lt 100 with groin pain, Rt 122   Right Hip Flexion 122   Right Hip External Rotation  19   Right Hip Internal Rotation  10  with pain   Left Hip Flexion 100   Left Hip External Rotation  9   Left Hip Internal Rotation  22   Right/Left Ankle --   DF Lt 8 degrees, Rt 5 degrees   Lumbar Flexion 6" from floor, hip tightness   Lumbar Extension stopped her at ~ 15 degrees d/t dx   Lumbar - Right Side Bend [facility]   Lumbar - Left Side Bend WFL   Lumbar - Right Rotation WFL   Lumbar - Left Rotation [facility]     Strength   Strength Assessment Site Hip;Knee;Ankle;Lumbar;Shoulder;Elbow;Hand   Right/Left Shoulder --  bilat WNL   Right/Left Elbow --  bilat WNL   Right/Left hand Left;Right   Right Hand Grip (lbs) 53#   Left Hand Grip (lbs) 50#   Right/Left Hip --  bilat WNL   Right/Left Knee --  bilat WNL   Right/Left Ankle --  bilat WNL   Lumbar Flexion --  TA good   Lumbar Extension --  multifidi good     Flexibility   Soft Tissue Assessment /Muscle Length --     Palpation   Palpation comment tightness in bilat hip rotators and adductors                    [facility] Adult PT Treatment/Exercise - 05/12/16 0001      Exercises   Exercises Knee/Hip     Knee/Hip Exercises: Stretches   Hip Flexor Stretch Both;1 rep;30 seconds  low lunge   Gastroc Stretch Both;1 rep;30 seconds   Soleus Stretch Both;1 rep;30 seconds   Other Knee/Hip Stretches butterfly stretch, SKTC with strap and IR hip stretch with strap, both LE's, 1 rep, 30 sec hold                PT Education - 05/12/16 1041    Education provided Yes   Education Details HEP   Person(s) Educated Patient   Methods Explanation;Demonstration;Handout   Comprehension Returned demonstration             PT Long Term Goals - 05/12/16 1046      PT LONG TERM GOAL #1   Title I with advanced HEP for lower body flexibility ( 06/09/16)    Time 4   Period Weeks   Status New     PT LONG TERM GOAL #2   Title demo bilat hip IR/ER WNL ( 06/09/16)    Time 4   Period Weeks   Status New  PT LONG TERM GOAL #3   Title report reduction of feelings of tightness in her back/hips =/> 75% ( 06/09/16)    Time 4   Period Weeks   Status New     PT LONG TERM GOAL #4    Title perform squat to simulate gardening with good form and report of =/> 75% ease ( 06/09/16)    Time 4   Period Weeks   Status New     PT LONG TERM GOAL #5   Title improve FOTO =/< 33% limited, CJ level (06/09/16)    Time 4   Period Weeks   Status New               Plan - 05/12/16 1043    Clinical Impression Statement 75 yo very active female.  She presents with good strength overall.  She does have limitations in her hip ROM that place stress on her back along with tight muscles around her hips.  Her hands and knees aren't of a concern to her at this time.  No limitations noted in her hands and knees are influenced by tight hips.    Rehab Potential Excellent   PT Frequency 1x / week   PT Duration 4 weeks   PT Treatment/Interventions Moist Heat;Ultrasound;Therapeutic exercise;Dry needling;Manual techniques;Electrical Stimulation;Cryotherapy;Patient/family education;Passive range of motion   PT Next Visit Plan TDN to hip adductors and rotators.    Consulted and Agree with Plan of Care Patient      Patient will benefit from skilled therapeutic intervention in order to improve the following deficits and impairments:  Pain, Impaired flexibility, Increased muscle spasms  Visit Diagnosis: Stiffness of left hip, not elsewhere classified - Plan: PT plan of care cert/re-cert  Stiffness of right hip, not elsewhere classified - Plan: PT plan of care cert/re-cert     Problem List Patient Active Problem List   Diagnosis Date Noted  . Hypothyroidism 09/05/2014  . Hyperlipidemia 09/05/2014  . Medicare annual wellness visit, subsequent 09/05/2014  . Lumbar spondylosis 07/07/2014  . 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 Visit for preventive health examination 08/31/2013  . Hair loss 01/20/2013  . Elevated MCV 07/22/2012  . Preventative health care 04/02/2011  . IRRITABLE BOWEL SYNDROME 07/10/2010  . GASTRITIS 07/04/2010  . RHINITIS 05/21/2010  . VERTIGO 05/21/2010  . VARICOSE  VEINS, LOWER EXTREMITIES 11/13/2009  . OTHER DISEASES OF NASAL CAVITY AND SINUSES 01/31/2008  . HYPOTHYROIDISM 08/31/2007  . HYPERLIPIDEMIA 08/31/2007  . OTHER SPEC DISEASES BLOOD&BLOOD-FORMING ORGANS 08/31/2007  . ROSACEA 08/31/2007  . Osteoarthritis of right hip 08/31/2007  . OSTEOPENIA 08/31/2007    [name] PT  05/12/2016, 10:51 AM  [facility] [place], [place], [address] Phone: [phone]   Fax:  [phone]  Name: ELBONY SHOLL MRN: [id number] Date of Birth: October 03, 1940

## 2016-05-20 ENCOUNTER — Ambulatory Visit (INDEPENDENT_AMBULATORY_CARE_PROVIDER_SITE_OTHER): Payer: Medicare Other | Admitting: Physical Therapy

## 2016-05-20 DIAGNOSIS — M25652 Stiffness of left hip, not elsewhere classified: Secondary | ICD-10-CM

## 2016-05-20 DIAGNOSIS — M25651 Stiffness of right hip, not elsewhere classified: Secondary | ICD-10-CM

## 2016-05-20 NOTE — Therapy (Signed)
[place], [place], [address] Phone: [phone]   Fax:  [phone]  Physical Therapy Treatment  Patient Details  Name: Angela Burgess MRN: [id number] Date of Birth: 18-Feb-1941 Referring Provider: Eliezer Lofts PA  Encounter Date: 05/20/2016      PT End of Session - 05/20/16 1012    Visit Number 2   Number of Visits 4   Date for PT Re-Evaluation 06/09/16   PT Start Time 1013   PT Stop Time 1124   PT Time Calculation (min) 71 min      Past Medical History:  Diagnosis Date  . Anemia   . Arthritis   . Cervical cancer ([place]) 1968  . Dexamethasone adverse reaction 2008  . Elevated MCV    for years  . Eye exam abnormal 6/09  . Family history of malignant neoplasm of gastrointestinal tract   . Gastritis   . History of mammogram 1/10  . History of normal resting EKG 2005  . Hyperlipidemia   . Hypothyroidism   . IBS (irritable bowel syndrome)   . Macrocytosis    with theme eval in past and normal b12  . Osteopenia   . Rectocele   . Rosacea   . Tetanus-diphtheria (Td) vaccination 1999    Past Surgical History:  Procedure Laterality Date  . ABDOMINAL HYSTERECTOMY    . APPENDECTOMY    . BREAST LUMPECTOMY      There were no vitals filed for this visit.      Subjective Assessment - 05/20/16 1014    Subjective Pt states she is able to do her HEP, she did start a little to hard at first.  Has some trouble with the rotation stretch using strap.    Currently in Pain? No/denies            [facility] PT Assessment - 05/20/16 0001      Assessment   Medical Diagnosis lumbar spondylolithesis, bilat knee pain, hand pain     AROM   Right Hip Internal Rotation  15  after treatment 28   Left Hip Internal Rotation  25  after treatment 25                     [facility] Adult PT Treatment/Exercise - 05/20/16 0001      Self-Care   Self-Care Other Self-Care Comments   Other  Self-Care Comments  reviewed HEP hip IR stretch      Knee/Hip Exercises: Stretches   Gastroc Stretch --  45 sec each on prostretch   Other Knee/Hip Stretches IR/ER hip stretches with strap     Knee/Hip Exercises: Aerobic   Nustep L5x5'     Knee/Hip Exercises: Standing   Other Standing Knee Exercises 2x10 functional sqauts keeping good alignment     Modalities   Modalities Electrical Stimulation;Moist Heat     Moist Heat Therapy   Number Minutes Moist Heat 20 Minutes   Moist Heat Location --  bilat inner thighs and Rt lower leg     Electrical Stimulation   Electrical Stimulation Location bilat inner thighs   Electrical Stimulation Action IFC   Electrical Stimulation Parameters to tolerance   Electrical Stimulation Goals Pain;Tone     Manual Therapy   Manual Therapy Soft tissue mobilization   Soft tissue mobilization medial thighs bilat, lateral Rt lower leg          Trigger Point Dry Needling - 05/20/16 1050    Consent Given?  Yes   Education Handout Provided Yes   Muscles Treated Lower Body Adductor longus/brevius/maximus  Rt peroneal longis good twitch   Adductor Response Palpable increased muscle length;Twitch response elicited  with e-stim                   PT Long Term Goals - 05/20/16 1156      PT LONG TERM GOAL #1   Title I with advanced HEP for lower body flexibility ( 06/09/16)    Status On-going     PT LONG TERM GOAL #2   Title demo bilat hip IR/ER WNL ( 06/09/16)    Status On-going     PT LONG TERM GOAL #3   Title report reduction of feelings of tightness in her back/hips =/> 75% ( 06/09/16)    Status On-going     PT LONG TERM GOAL #4   Title perform squat to simulate gardening with good form and report of =/> 75% ease ( 06/09/16)    Status On-going     PT LONG TERM GOAL #5   Title improve FOTO =/< 33% limited, CJ level (06/09/16)    Status On-going               Plan - 05/20/16 1156    Clinical Impression Statement This is  Jahzara second visit, no goals met.  She did report a feeling of increased mobility after treatment.  She is very tight through her hips and ankles.     Rehab Potential Excellent   PT Frequency 1x / week   PT Duration 4 weeks   PT Treatment/Interventions Moist Heat;Ultrasound;Therapeutic exercise;Dry needling;Manual techniques;Electrical Stimulation;Cryotherapy;Patient/family education;Passive range of motion   PT Next Visit Plan assess response to TDN in hips and lower Rt leg   Consulted and Agree with Plan of Care Patient      Patient will benefit from skilled therapeutic intervention in order to improve the following deficits and impairments:  Pain, Impaired flexibility, Increased muscle spasms  Visit Diagnosis: Stiffness of left hip, not elsewhere classified  Stiffness of right hip, not elsewhere classified     Problem List Patient Active Problem List   Diagnosis Date Noted  . Hypothyroidism 09/05/2014  . Hyperlipidemia 09/05/2014  . Medicare annual wellness visit, subsequent 09/05/2014  . Lumbar spondylosis 07/07/2014  . 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 Visit for preventive health examination 08/31/2013  . Hair loss 01/20/2013  . Elevated MCV 07/22/2012  . Preventative health care 04/02/2011  . IRRITABLE BOWEL SYNDROME 07/10/2010  . GASTRITIS 07/04/2010  . RHINITIS 05/21/2010  . VERTIGO 05/21/2010  . VARICOSE VEINS, LOWER EXTREMITIES 11/13/2009  . OTHER DISEASES OF NASAL CAVITY AND SINUSES 01/31/2008  . HYPOTHYROIDISM 08/31/2007  . HYPERLIPIDEMIA 08/31/2007  . OTHER SPEC DISEASES BLOOD&BLOOD-FORMING ORGANS 08/31/2007  . ROSACEA 08/31/2007  . Osteoarthritis of right hip 08/31/2007  . OSTEOPENIA 08/31/2007    [name] PT  05/20/2016, 11:58 AM  [facility] [place], [place], [address] Phone: [phone]   Fax:  [phone]  Name: KAMDEN REBER MRN: [id number] Date of Birth:  1940-10-14

## 2016-05-29 ENCOUNTER — Ambulatory Visit (INDEPENDENT_AMBULATORY_CARE_PROVIDER_SITE_OTHER): Payer: Medicare Other | Admitting: Physical Therapy

## 2016-05-29 DIAGNOSIS — M25651 Stiffness of right hip, not elsewhere classified: Secondary | ICD-10-CM | POA: Diagnosis not present

## 2016-05-29 DIAGNOSIS — M25652 Stiffness of left hip, not elsewhere classified: Secondary | ICD-10-CM

## 2016-05-29 NOTE — Therapy (Signed)
[place], [place], [address] Phone: [phone]   Fax:  [phone]  Physical Therapy Treatment  Patient Details  Name: Angela Burgess MRN: [id number] Date of Birth: 08-18-1941 Referring Provider: Eliezer Lofts PA  Encounter Date: 05/29/2016      PT End of Session - 05/29/16 1445    Visit Number 3   Number of Visits 4   Date for PT Re-Evaluation 06/09/16   PT Start Time [id number]   PT Stop Time 1550   PT Time Calculation (min) 65 min   Activity Tolerance Patient tolerated treatment well      Past Medical History:  Diagnosis Date  . Anemia   . Arthritis   . Cervical cancer ([place]) 1968  . Dexamethasone adverse reaction 2008  . Elevated MCV    for years  . Eye exam abnormal 6/09  . Family history of malignant neoplasm of gastrointestinal tract   . Gastritis   . History of mammogram 1/10  . History of normal resting EKG 2005  . Hyperlipidemia   . Hypothyroidism   . IBS (irritable bowel syndrome)   . Macrocytosis    with theme eval in past and normal b12  . Osteopenia   . Rectocele   . Rosacea   . Tetanus-diphtheria (Td) vaccination 1999    Past Surgical History:  Procedure Laterality Date  . ABDOMINAL HYSTERECTOMY    . APPENDECTOMY    . BREAST LUMPECTOMY      There were no vitals filed for this visit.      Subjective Assessment - 05/29/16 1446    Subjective Angela Burgess thinks the needling has helped her some, says it is difficulty to tell for sure. Big difference in the night pain in the Rt lower leg.    Patient Stated Goals learn and make sure she is doing exercise that are safe for her, bend over and work in her garden easier.    Currently in Pain? Yes  from [place]    Pain Score 4    Pain Location --  bilat inner thighs   Pain Orientation Left;Right   Pain Descriptors / Indicators Aching  muscular working feeling   Aggravating Factors  tia chi this am   Pain Relieving  Factors rest            [facility] PT Assessment - 05/29/16 0001      Assessment   Medical Diagnosis lumbar spondylolithesis, bilat knee pain, hand pain   Referring Provider Eliezer Lofts PA   Onset Date/Surgical Date 01/11/16     AROM   Right Hip External Rotation  35   Right Hip Internal Rotation  25   Left Hip External Rotation  28   Left Hip Internal Rotation  35                     [facility] Adult PT Treatment/Exercise - 05/29/16 0001      Knee/Hip Exercises: Stretches   Gastroc Stretch Both;1 rep;30 seconds   Soleus Stretch 1 rep;30 seconds;Both   Other Knee/Hip Stretches butterfly stretch     Knee/Hip Exercises: Aerobic   Nustep L5x5'     Knee/Hip Exercises: Standing   Heel Raises Both;3 sets;10 reps  one set each toes in/out/straight     Modalities   Modalities Electrical Stimulation;Moist Heat     Moist Heat Therapy   Number Minutes Moist Heat 20 Minutes   Moist Heat Location --  buttocks, bilat  inner thighs, Rt calf     Electrical Stimulation   Electrical Stimulation Location bilat inner thighs  buttocks   Electrical Stimulation Action IFC   Electrical Stimulation Parameters to tolerance   Electrical Stimulation Goals Pain;Tone          Trigger Point Dry Needling - 05/29/16 1502    Consent Given? Yes   Education Handout Provided No   Muscles Treated Upper Body Gluteus maximus;Piriformis;Adductor longus/brevius/magnus  bilat    Muscles Treated Lower Body --  Rt peroneal    Adductor Response Palpable increased muscle length;Twitch response elicited                   PT Long Term Goals - 05/29/16 1449      PT LONG TERM GOAL #1   Title I with advanced HEP for lower body flexibility ( 06/09/16)    Status Achieved     PT LONG TERM GOAL #2   Title demo bilat hip IR/ER WNL ( 06/09/16)      PT LONG TERM GOAL #3   Title report reduction of feelings of tightness in her back/hips =/> 75% ( 06/09/16)    Status On-going     PT  LONG TERM GOAL #4   Title perform squat to simulate gardening with good form and report of =/> 75% ease ( 06/09/16)    Status --  pt hasn't attempted at home yet     PT LONG TERM GOAL #5   Title improve FOTO =/< 33% limited, CJ level (06/09/16)    Status On-going               Plan - 05/29/16 [place] did very well after last tx, increased flexibility and some decreased pain especially in the lower Rt leg. Progressing to goals   Rehab Potential Excellent   PT Frequency 1x / week   PT Duration 4 weeks   PT Treatment/Interventions Moist Heat;Ultrasound;Therapeutic exercise;Dry needling;Manual techniques;Electrical Stimulation;Cryotherapy;Patient/family education;Passive range of motion   PT Next Visit Plan FOTO and assess for discharge in 2 wks   Consulted and Agree with Plan of Care Patient      Patient will benefit from skilled therapeutic intervention in order to improve the following deficits and impairments:  Pain, Impaired flexibility, Increased muscle spasms  Visit Diagnosis: Stiffness of left hip, not elsewhere classified  Stiffness of right hip, not elsewhere classified     Problem List Patient Active Problem List   Diagnosis Date Noted  . Hypothyroidism 09/05/2014  . Hyperlipidemia 09/05/2014  . Medicare annual wellness visit, subsequent 09/05/2014  . Lumbar spondylosis 07/07/2014  . 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 Visit for preventive health examination 08/31/2013  . Hair loss 01/20/2013  . Elevated MCV 07/22/2012  . Preventative health care 04/02/2011  . IRRITABLE BOWEL SYNDROME 07/10/2010  . GASTRITIS 07/04/2010  . RHINITIS 05/21/2010  . VERTIGO 05/21/2010  . VARICOSE VEINS, LOWER EXTREMITIES 11/13/2009  . OTHER DISEASES OF NASAL CAVITY AND SINUSES 01/31/2008  . HYPOTHYROIDISM 08/31/2007  . HYPERLIPIDEMIA 08/31/2007  . OTHER SPEC DISEASES BLOOD&BLOOD-FORMING ORGANS 08/31/2007  . ROSACEA 08/31/2007  . Osteoarthritis  of right hip 08/31/2007  . OSTEOPENIA 08/31/2007    Angela Burgess PT  05/29/2016, 4:18 PM  [facility] [place], [place], [address] Phone: [phone]   Fax:  [phone]  Name: Angela Burgess MRN: [id number] Date of Birth: 1941-03-17

## 2016-06-03 DIAGNOSIS — D485 Neoplasm of uncertain behavior of skin: Secondary | ICD-10-CM | POA: Diagnosis not present

## 2016-06-03 DIAGNOSIS — L570 Actinic keratosis: Secondary | ICD-10-CM | POA: Diagnosis not present

## 2016-06-03 DIAGNOSIS — Z85828 Personal history of other malignant neoplasm of skin: Secondary | ICD-10-CM | POA: Diagnosis not present

## 2016-06-03 DIAGNOSIS — D2339 Other benign neoplasm of skin of other parts of face: Secondary | ICD-10-CM | POA: Diagnosis not present

## 2016-06-03 DIAGNOSIS — Z08 Encounter for follow-up examination after completed treatment for malignant neoplasm: Secondary | ICD-10-CM | POA: Diagnosis not present

## 2016-06-10 ENCOUNTER — Ambulatory Visit (INDEPENDENT_AMBULATORY_CARE_PROVIDER_SITE_OTHER): Payer: Medicare Other | Admitting: Physical Therapy

## 2016-06-10 ENCOUNTER — Encounter: Payer: Self-pay | Admitting: Physical Therapy

## 2016-06-10 DIAGNOSIS — M25651 Stiffness of right hip, not elsewhere classified: Secondary | ICD-10-CM

## 2016-06-10 DIAGNOSIS — M25652 Stiffness of left hip, not elsewhere classified: Secondary | ICD-10-CM

## 2016-06-10 NOTE — Therapy (Signed)
Angela Burgess, Angela Burgess, Angela Burgess Phone: [phone]   Fax:  [phone]  Physical Therapy Treatment  Patient Details  Name: Angela Burgess MRN: [id number] Date of Birth: 24-Nov-1940 Referring Provider: Eliezer Lofts PA  Encounter Date: 06/10/2016      PT End of Session - 06/10/16 1105    Visit Number 4   Number of Visits 6   Date for PT Re-Evaluation 06/24/16   PT Start Time 1105   PT Stop Time 1154   PT Time Calculation (min) 49 min   Activity Tolerance Patient tolerated treatment well      Past Medical History:  Diagnosis Date  . Anemia   . Arthritis   . Cervical cancer ([place]) 1968  . Dexamethasone adverse reaction 2008  . Elevated MCV    for years  . Eye exam abnormal 6/09  . Family history of malignant neoplasm of gastrointestinal tract   . Gastritis   . History of mammogram 1/10  . History of normal resting EKG 2005  . Hyperlipidemia   . Hypothyroidism   . IBS (irritable bowel syndrome)   . Macrocytosis    with theme eval in past and normal b12  . Osteopenia   . Rectocele   . Rosacea   . Tetanus-diphtheria (Td) vaccination 1999    Past Surgical History:  Procedure Laterality Date  . ABDOMINAL HYSTERECTOMY    . APPENDECTOMY    . BREAST LUMPECTOMY      There were no vitals filed for this visit.      Subjective Assessment - 06/10/16 1106    Subjective Pt was out of town last week, she has some discomfort in her thighs/hip flexors however doesnt' feel like its pain. She thinks the TDN helped alot.    Currently in Pain? No/denies            [facility] PT Assessment - 06/10/16 0001      Assessment   Medical Diagnosis lumbar spondylolithesis, bilat knee pain, hand pain     AROM   Right Hip External Rotation  38   Right Hip Internal Rotation  38   Left Hip External Rotation  35   Left Hip Internal Rotation  39   Lumbar Flexion 3" from the floor     Palpation   Palpation comment some tightness in Rt piriformis                     [facility] Adult PT Treatment/Exercise - 06/10/16 0001      Knee/Hip Exercises: Stretches   Passive Hamstring Stretch 3 reps;Both;30 seconds  with strap   Other Knee/Hip Stretches BKT, SKTC, lower body rotation stretches     Modalities   Modalities Electrical Stimulation;Moist Heat     Moist Heat Therapy   Number Minutes Moist Heat 15 Minutes   Moist Heat Location Lumbar Spine     Electrical Stimulation   Electrical Stimulation Location lumbar   Electrical Stimulation Action IFC   Electrical Stimulation Parameters to tolerance   Electrical Stimulation Goals Pain;Tone     Manual Therapy   Manual Therapy Soft tissue mobilization   Soft tissue mobilization Rt buttocks and bilat lumbar paraspinals          Trigger Point Dry Needling - 06/10/16 1116    Consent Given? Yes   Education Handout Provided No   Muscles Treated Upper Body Gluteus maximus;Gluteus minimus;Longissimus  lumbar   Longissimus Response Twitch response elicited;Palpable increased  muscle length                   PT Long Term Goals - 06/10/16 1117      PT LONG TERM GOAL #1   Title I with advanced HEP for lower body flexibility ( 06/09/16)    Status Achieved     PT LONG TERM GOAL #2   Title demo bilat hip IR/ER WNL ( 06/09/16)    Status Achieved     PT LONG TERM GOAL #3   Title report reduction of feelings of tightness in her back/hips =/> 75% ( 06/09/16)    Status On-going  70% better     PT LONG TERM GOAL #4   Title perform squat to simulate gardening with good form and report of =/> 75% ease ( 06/09/16)    Status On-going     PT LONG TERM GOAL #5   Title improve FOTO =/< 33% limited, CJ level (06/09/16)    Status On-going  35% limited               Plan - 06/10/16 1256    Clinical Impression Statement Angela Burgess continues to show improvement, she maintained and improved on her hip rotation over the last  week meeting this goal.  She still has tightness and intermittent discomfort in her low back.  Lumbar ROM is improving as she becomes more loose.  Would benefit from a couple more treatments.    Rehab Potential Excellent   PT Frequency 1x / week   PT Duration 2 weeks   PT Treatment/Interventions Moist Heat;Ultrasound;Therapeutic exercise;Dry needling;Manual techniques;Electrical Stimulation;Cryotherapy;Patient/family education;Passive range of motion   PT Next Visit Plan continue with TDN to  back/buttocks.    Consulted and Agree with Plan of Care Patient      Patient will benefit from skilled therapeutic intervention in order to improve the following deficits and impairments:  Pain, Impaired flexibility, Increased muscle spasms  Visit Diagnosis: Stiffness of right hip, not elsewhere classified  Stiffness of left hip, not elsewhere classified     Problem List Patient Active Problem List   Diagnosis Date Noted  . Hypothyroidism 09/05/2014  . Hyperlipidemia 09/05/2014  . Medicare annual wellness visit, subsequent 09/05/2014  . Lumbar spondylosis 07/07/2014  . 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 Visit for preventive health examination 08/31/2013  . Hair loss 01/20/2013  . Elevated MCV 07/22/2012  . Preventative health care 04/02/2011  . IRRITABLE BOWEL SYNDROME 07/10/2010  . GASTRITIS 07/04/2010  . RHINITIS 05/21/2010  . VERTIGO 05/21/2010  . VARICOSE VEINS, LOWER EXTREMITIES 11/13/2009  . OTHER DISEASES OF NASAL CAVITY AND SINUSES 01/31/2008  . HYPOTHYROIDISM 08/31/2007  . HYPERLIPIDEMIA 08/31/2007  . OTHER SPEC DISEASES BLOOD&BLOOD-FORMING ORGANS 08/31/2007  . ROSACEA 08/31/2007  . Osteoarthritis of right hip 08/31/2007  . OSTEOPENIA 08/31/2007    [name] PT  06/10/2016, 12:59 PM  [facility] [place], Angela Burgess, [address] Phone: [phone]   Fax:  [phone]  Name: Angela Burgess MRN:  [id number] Date of Birth: 28-Aug-1941

## 2016-06-19 ENCOUNTER — Encounter: Payer: Self-pay | Admitting: Physical Therapy

## 2016-06-19 ENCOUNTER — Ambulatory Visit (INDEPENDENT_AMBULATORY_CARE_PROVIDER_SITE_OTHER): Payer: Medicare Other | Admitting: Physical Therapy

## 2016-06-19 DIAGNOSIS — M25652 Stiffness of left hip, not elsewhere classified: Secondary | ICD-10-CM | POA: Diagnosis not present

## 2016-06-19 DIAGNOSIS — M25651 Stiffness of right hip, not elsewhere classified: Secondary | ICD-10-CM | POA: Diagnosis not present

## 2016-06-19 NOTE — Therapy (Signed)
[place], [place], [address] Phone: [phone]   Fax:  [phone]  Physical Therapy Treatment  Patient Details  Name: Angela Burgess MRN: [id number] Date of Birth: Jul 29, 1941 Referring Provider: Eliezer Lofts PA  Encounter Date: 06/19/2016      PT End of Session - 06/19/16 0932    Visit Number 5   Number of Visits 6   Date for PT Re-Evaluation 06/24/16   PT Start Time 0932   PT Stop Time 1033   PT Time Calculation (min) 61 min   Activity Tolerance Patient tolerated treatment well      Past Medical History:  Diagnosis Date  . Anemia   . Arthritis   . Cervical cancer ([place]) 1968  . Dexamethasone adverse reaction 2008  . Elevated MCV    for years  . Eye exam abnormal 6/09  . Family history of malignant neoplasm of gastrointestinal tract   . Gastritis   . History of mammogram 1/10  . History of normal resting EKG 2005  . Hyperlipidemia   . Hypothyroidism   . IBS (irritable bowel syndrome)   . Macrocytosis    with theme eval in past and normal b12  . Osteopenia   . Rectocele   . Rosacea   . Tetanus-diphtheria (Td) vaccination 1999    Past Surgical History:  Procedure Laterality Date  . ABDOMINAL HYSTERECTOMY    . APPENDECTOMY    . BREAST LUMPECTOMY      There were no vitals filed for this visit.      Subjective Assessment - 06/19/16 0938    Subjective Angela Burgess reports she is doing pretty good, is stiff as she had to sit a lot yesterday.  Her husband has his first round of chemo yesterday.    Patient Stated Goals learn and make sure she is doing exercise that are safe for her, bend over and work in her garden easier.    Currently in Pain? No/denies            [facility] PT Assessment - 06/19/16 0001      Assessment   Medical Diagnosis lumbar spondylolithesis, bilat knee pain, hand pain                     [facility] Adult PT Treatment/Exercise - 06/19/16 0001       Therapeutic Activites    Therapeutic Activities Other Therapeutic Activities   Other Therapeutic Activities simulated high kneel working in the garden      Knee/Hip Exercises: Stretches   Hip Flexor Stretch Both;1 rep;30 seconds  low lunge, with trunk extension   Other Knee/Hip Stretches bilat ITB stretch, cross body with strap 2x30sec  Rt tighter than Lt      Knee/Hip Exercises: Aerobic   Recumbent Bike L2x4' VC to keep knees adducted for good alignment     Knee/Hip Exercises: Standing   Forward Lunges Both;2 sets;10 reps     Modalities   Modalities Electrical Stimulation;Moist Heat     Moist Heat Therapy   Number Minutes Moist Heat 15 Minutes   Moist Heat Location Lumbar Spine;Hip  Rt side     Electrical Stimulation   Electrical Stimulation Location lumbar& Rt buttock   Electrical Stimulation Action IFC   Electrical Stimulation Parameters to tolerance   Electrical Stimulation Goals Pain;Tone     Manual Therapy   Manual Therapy Soft tissue mobilization;Joint mobilization   Joint Mobilization Rt hip, grade III-IV with distraction in  supine   Soft tissue mobilization Rt lumbar paraspinals, gluts, piriformis          Trigger Point Dry Needling - 06/19/16 1007    Consent Given? Yes   Education Handout Provided No   Muscles Treated Upper Body Longissimus;Gluteus maximus  Rt   Muscles Treated Lower Body Gluteus maximus  rt   Longissimus Response Palpable increased muscle length;Twitch response elicited  with stim   Gluteus Maximus Response --  very tender today                   PT Long Term Goals - 06/19/16 0933      PT LONG TERM GOAL #1   Title I with advanced HEP for lower body flexibility ( 06/09/16)    Status Achieved     PT LONG TERM GOAL #2   Title demo bilat hip IR/ER WNL ( 06/09/16)    Status Achieved     PT LONG TERM GOAL #3   Title report reduction of feelings of tightness in her back/hips =/> 75% ( 06/09/16)    Status On-going      PT LONG TERM GOAL #4   Title perform squat to simulate gardening with good form and report of =/> 75% ease ( 06/09/16)    Status On-going  pt reports 40% improvement     PT LONG TERM GOAL #5   Title improve FOTO =/< 33% limited, CJ level (06/09/16)    Status On-going               Plan - 06/19/16 1021    Clinical Impression Statement Angela Burgess responded well to treatment today, had decreased Rt hip/low back symptoms. Able to reach up without the pulling in her Rt hip. Progressing to goals.    Rehab Potential Excellent   PT Frequency 1x / week   PT Duration 2 weeks   PT Treatment/Interventions Moist Heat;Ultrasound;Therapeutic exercise;Dry needling;Manual techniques;Electrical Stimulation;Cryotherapy;Patient/family education;Passive range of motion   PT Next Visit Plan assess for discharge   Consulted and Agree with Plan of Care Patient      Patient will benefit from skilled therapeutic intervention in order to improve the following deficits and impairments:  Pain, Impaired flexibility, Increased muscle spasms  Visit Diagnosis: Stiffness of right hip, not elsewhere classified  Stiffness of left hip, not elsewhere classified     Problem List Patient Active Problem List   Diagnosis Date Noted  . Hypothyroidism 09/05/2014  . Hyperlipidemia 09/05/2014  . Medicare annual wellness visit, subsequent 09/05/2014  . Lumbar spondylosis 07/07/2014  . 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 Visit for preventive health examination 08/31/2013  . Hair loss 01/20/2013  . Elevated MCV 07/22/2012  . Preventative health care 04/02/2011  . IRRITABLE BOWEL SYNDROME 07/10/2010  . GASTRITIS 07/04/2010  . RHINITIS 05/21/2010  . VERTIGO 05/21/2010  . VARICOSE VEINS, LOWER EXTREMITIES 11/13/2009  . OTHER DISEASES OF NASAL CAVITY AND SINUSES 01/31/2008  . HYPOTHYROIDISM 08/31/2007  . HYPERLIPIDEMIA 08/31/2007  . OTHER SPEC DISEASES BLOOD&BLOOD-FORMING ORGANS 08/31/2007  . ROSACEA 08/31/2007  .  Osteoarthritis of right hip 08/31/2007  . OSTEOPENIA 08/31/2007    [name] PT 06/19/2016, 10:23 AM  [facility] [place], [place], [address] Phone: [phone]   Fax:  [phone]  Name: Angela Burgess MRN: [id number] Date of Birth: December 18, 1940

## 2016-06-24 ENCOUNTER — Ambulatory Visit (INDEPENDENT_AMBULATORY_CARE_PROVIDER_SITE_OTHER): Payer: Medicare Other | Admitting: Physical Therapy

## 2016-06-24 DIAGNOSIS — M25652 Stiffness of left hip, not elsewhere classified: Secondary | ICD-10-CM | POA: Diagnosis not present

## 2016-06-24 DIAGNOSIS — M25651 Stiffness of right hip, not elsewhere classified: Secondary | ICD-10-CM

## 2016-06-24 NOTE — Patient Instructions (Addendum)
Shoulder Blade Squeeze: Fingers Interlaced    Fingers interlaced behind your body, palms facing each other. Press pelvis down. Squeeze backbone with shoulder blades. Raise front of shoulders, chest, and head. Keep neck neutral. Hold _1-2__ seconds. Relax. Repeat _1-2x10__ times. 3 times a week  Opposite Arm / Leg Lift (Prone)    Abdomen and head supported, left knee locked, raise leg and opposite arm __8-10__ inches from floor. Repeat __10__ times per set. Do _1-2_ sets per session. Do _3 sessions per week.  Body-Weight Forward Lunge: Stable - Stationary (Active)    Stand in wide stride, legs shoulder width apart, head up, back flat. Bend both legs simultaneously until forward thigh is parallel to floor. Complete __1-2_ sets of _10__ repetitions. Perform _3__ sessions per week.  Therapeutic - Bridging    Lift buttocks, keeping back straight and arms on floor. Hold __1-2__ seconds. Repeat __10-20__ times, on each leg. 3 times a week.   Outer Hip Stretch: Reclined IT Band Stretch (Strap)    Strap around opposite foot, pull across only as far as possible with shoulders on mat. Hold for _30___ secs. Repeat __2-3__ times each leg. 3 times a week.  Copyright  [employer]. All rights reserve.          TENS UNIT: This is helpful for muscle pain and spasm.   Search and Purchase a TENS 7000 2nd edition at www.tenspros.com. It should be less than $30.     TENS unit instructions: Do not shower or bathe with the unit on Turn the unit off before removing electrodes or batteries If the electrodes lose stickiness add a drop of water to the electrodes after they are disconnected from the unit and place on plastic sheet. If you continued to have difficulty, call the TENS unit company to purchase more electrodes. Do not apply lotion on the skin area prior to use. Make sure the skin is clean and dry as this will help prolong the life of the electrodes. After use, always check skin for  unusual red areas, rash or other skin difficulties. If there are any skin problems, does not apply electrodes to the same area. Never remove the electrodes from the unit by pulling the wires. Do not use the TENS unit or electrodes other than as directed. Do not change electrode placement without consultating your therapist or physician. Keep 2 fingers with between each electrode. Wear time ratio is 2:1, on to off times.    For example on for 30 minutes off for 15 minutes and then on for 30 minutes off for 15 minutes

## 2016-06-24 NOTE — Therapy (Signed)
[place], [place], [address] Phone: [phone]   Fax:  [phone]  Physical Therapy Treatment  Patient Details  Name: DEMRI POULTON MRN: [id number] Date of Birth: June 13, 1941 Referring Provider: Eliezer Lofts PA  Encounter Date: 06/24/2016      PT End of Session - 06/24/16 0843    Visit Number 6   Number of Visits 6   Date for PT Re-Evaluation 06/24/16   PT Start Time 0843   PT Stop Time 0945   PT Time Calculation (min) 62 min   Activity Tolerance Patient tolerated treatment well      Past Medical History:  Diagnosis Date  . Anemia   . Arthritis   . Cervical cancer ([place]) 1968  . Dexamethasone adverse reaction 2008  . Elevated MCV    for years  . Eye exam abnormal 6/09  . Family history of malignant neoplasm of gastrointestinal tract   . Gastritis   . History of mammogram 1/10  . History of normal resting EKG 2005  . Hyperlipidemia   . Hypothyroidism   . IBS (irritable bowel syndrome)   . Macrocytosis    with theme eval in past and normal b12  . Osteopenia   . Rectocele   . Rosacea   . Tetanus-diphtheria (Td) vaccination 1999    Past Surgical History:  Procedure Laterality Date  . ABDOMINAL HYSTERECTOMY    . APPENDECTOMY    . BREAST LUMPECTOMY      There were no vitals filed for this visit.      Subjective Assessment - 06/24/16 0845    Subjective Marylen was very sore after the last treatment from her muscles and the lunges. She is having other issues right now that aren't related to her hips or back.    Currently in Pain? No/denies  in her back and hips            [facility] PT Assessment - 06/24/16 0001      Assessment   Medical Diagnosis lumbar spondylolithesis, bilat knee pain, hand pain   Referring Provider Eliezer Lofts PA   Onset Date/Surgical Date 01/11/16     Observation/Other Assessments   Focus on Therapeutic Outcomes (FOTO)  44% limited      Strength   Strength Assessment Site Hip;Lumbar   Right/Left Hip --  bilat WNL with break test   Lumbar Flexion 5/5                     [facility] Adult PT Treatment/Exercise - 06/24/16 0001      Self-Care   Self-Care Other Self-Care Comments   Other Self-Care Comments  home TENs unit     Exercises   Exercises Lumbar     Lumbar Exercises: Stretches   Quad Stretch 30 seconds  prone with strap   ITB Stretch 3 reps;30 seconds  cross body with strap     Lumbar Exercises: Prone   Opposite Arm/Leg Raise Left arm/Right leg;10 reps   Other Prone Lumbar Exercises 10 reps upper body lifts     Knee/Hip Exercises: Aerobic   Recumbent Bike L2x4' VC to keep knees adducted for good alignment     Modalities   Modalities Electrical Stimulation;Moist Heat     Moist Heat Therapy   Number Minutes Moist Heat 15 Minutes   Moist Heat Location Lumbar Spine  buttocks     Electrical Stimulation   Electrical Stimulation Location lumbar& Rt buttock   Electrical Stimulation  Action IFC   Electrical Stimulation Parameters to tolerance   Electrical Stimulation Goals Pain     Manual Therapy   Manual Therapy Joint mobilization   Joint Mobilization Rt hip grade III-IV then with manual traction    Soft tissue mobilization Rt upper gluts                PT Education - 06/24/16 0931    Education provided Yes   Education Details HEP, TENs   Person(s) Educated Patient   Methods Explanation;Demonstration   Comprehension Returned demonstration             PT Long Term Goals - 06/24/16 0847      PT LONG TERM GOAL #1   Title I with advanced HEP for lower body flexibility ( 06/09/16)    Status Achieved     PT LONG TERM GOAL #2   Title demo bilat hip IR/ER WNL ( 06/09/16)    Status Achieved     PT LONG TERM GOAL #3   Title report reduction of feelings of tightness in her back/hips =/> 75% ( 06/09/16)    Status Not Met  60% improvement     PT LONG TERM GOAL #4   Title  perform squat to simulate gardening with good form and report of =/> 75% ease ( 06/09/16)    Status Achieved     PT LONG TERM GOAL #5   Title improve FOTO =/< 33% limited, CJ level (06/09/16)    Status Not Met  44% limited, CK level               Plan - 06/24/16 0933    Clinical Impression Statement This is Toneshia's last visit.  She is going to work on her HEP and should continue to progress.  She had a slight change in status this week, reports it is not related to her back/hips though.    PT Next Visit Plan d/c to HEP    Consulted and Agree with Plan of Care Patient      Patient will benefit from skilled therapeutic intervention in order to improve the following deficits and impairments:     Visit Diagnosis: Stiffness of right hip, not elsewhere classified  Stiffness of left hip, not elsewhere classified     Problem List Patient Active Problem List   Diagnosis Date Noted  . Hypothyroidism 09/05/2014  . Hyperlipidemia 09/05/2014  . Medicare annual wellness visit, subsequent 09/05/2014  . Lumbar spondylosis 07/07/2014  . 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 Visit for preventive health examination 08/31/2013  . Hair loss 01/20/2013  . Elevated MCV 07/22/2012  . Preventative health care 04/02/2011  . IRRITABLE BOWEL SYNDROME 07/10/2010  . GASTRITIS 07/04/2010  . RHINITIS 05/21/2010  . VERTIGO 05/21/2010  . VARICOSE VEINS, LOWER EXTREMITIES 11/13/2009  . OTHER DISEASES OF NASAL CAVITY AND SINUSES 01/31/2008  . HYPOTHYROIDISM 08/31/2007  . HYPERLIPIDEMIA 08/31/2007  . OTHER SPEC DISEASES BLOOD&BLOOD-FORMING ORGANS 08/31/2007  . ROSACEA 08/31/2007  . Osteoarthritis of right hip 08/31/2007  . OSTEOPENIA 08/31/2007    SHAVER,SUE 06/24/2016, 9:37 AM  [facility] [place], [place], [address] Phone: [phone]   Fax:  [phone]  Name: ROSLAND RIDING MRN: [id number] Date of Birth:  05/09/1941  PHYSICAL THERAPY DISCHARGE SUMMARY  Visits from Start of Care: 6  Current functional level related to goals / functional outcomes: Great improvement in hip ROM, still has some stiffness with hip adduction.    Remaining deficits: See  above   Education / Equipment: HEP  Plan: Patient agrees to discharge.  Patient goals were partially met. Patient is being discharged due to being pleased with the current functional level.  ?????    [name], PT 06/24/16 9:37 AM

## 2016-07-21 ENCOUNTER — Other Ambulatory Visit (HOSPITAL_BASED_OUTPATIENT_CLINIC_OR_DEPARTMENT_OTHER): Payer: Medicare Other

## 2016-07-21 ENCOUNTER — Telehealth: Payer: Self-pay | Admitting: *Deleted

## 2016-07-21 LAB — CMP (CANCER CENTER ONLY)
ALBUMIN: 3.5 g/dL (ref 3.3–5.5)
ALT(SGPT): 19 U/L (ref 10–47)
AST: 21 U/L (ref 11–38)
Alkaline Phosphatase: 59 U/L (ref 26–84)
BUN, Bld: 11 mg/dL (ref 7–22)
CHLORIDE: 98 meq/L (ref 98–108)
CO2: 28 meq/L (ref 18–33)
CREATININE: 0.7 mg/dL (ref 0.6–1.2)
Calcium: 8.9 mg/dL (ref 8.0–10.3)
GLUCOSE: 109 mg/dL (ref 73–118)
Potassium: 3.6 mEq/L (ref 3.3–4.7)
SODIUM: 138 meq/L (ref 128–145)
Total Bilirubin: 0.6 mg/dl (ref 0.20–1.60)
Total Protein: 6.6 g/dL (ref 6.4–8.1)

## 2016-07-21 LAB — CBC WITH DIFFERENTIAL (CANCER CENTER ONLY)
BASO#: 0 10*3/uL (ref 0.0–0.2)
BASO%: 0.6 % (ref 0.0–2.0)
EOS ABS: 0.1 10*3/uL (ref 0.0–0.5)
EOS%: 1.5 % (ref 0.0–7.0)
HCT: 34.4 % — ABNORMAL LOW (ref 34.8–46.6)
HEMOGLOBIN: 11.8 g/dL (ref 11.6–15.9)
LYMPH#: 1.1 10*3/uL (ref 0.9–3.3)
LYMPH%: 33.6 % (ref 14.0–48.0)
MCH: 33.4 pg (ref 26.0–34.0)
MCHC: 34.3 g/dL (ref 32.0–36.0)
MCV: 98 fL (ref 81–101)
MONO#: 0.2 10*3/uL (ref 0.1–0.9)
MONO%: 7.2 % (ref 0.0–13.0)
NEUT#: 1.9 10*3/uL (ref 1.5–6.5)
NEUT%: 57.1 % (ref 39.6–80.0)
PLATELETS: 211 10*3/uL (ref 145–400)
RBC: 3.53 10*6/uL — ABNORMAL LOW (ref 3.70–5.32)
RDW: 12.4 % (ref 11.1–15.7)
WBC: 3.3 10*3/uL — ABNORMAL LOW (ref 3.9–10.0)

## 2016-07-21 LAB — IRON AND TIBC
%SAT: 37 % (ref 21–57)
IRON: 90 ug/dL (ref 41–142)
TIBC: 245 ug/dL (ref 236–444)
UIBC: 155 ug/dL (ref 120–384)

## 2016-07-21 LAB — FERRITIN: Ferritin: 11 ng/ml (ref 9–269)

## 2016-07-21 NOTE — Telephone Encounter (Addendum)
Patient aware of results  ----- Message from [name], MD sent at 07/21/2016 12:24 PM EDT ----- Call - iron level is nice and low!!!  NO need foo phlebotomy!!  Angela Burgess

## 2016-07-24 ENCOUNTER — Ambulatory Visit (HOSPITAL_BASED_OUTPATIENT_CLINIC_OR_DEPARTMENT_OTHER): Payer: Medicare Other

## 2016-07-24 ENCOUNTER — Ambulatory Visit (HOSPITAL_BASED_OUTPATIENT_CLINIC_OR_DEPARTMENT_OTHER): Payer: Medicare Other | Admitting: Hematology & Oncology

## 2016-07-24 DIAGNOSIS — Z23 Encounter for immunization: Secondary | ICD-10-CM | POA: Diagnosis not present

## 2016-07-24 MED ORDER — INFLUENZA VAC SPLIT QUAD 0.5 ML IM SUSY
0.5000 mL | PREFILLED_SYRINGE | Freq: Once | INTRAMUSCULAR | Status: AC
  Administered 2016-07-24: 0.5 mL via INTRAMUSCULAR
  Filled 2016-07-24: qty 0.5

## 2016-07-24 NOTE — Progress Notes (Signed)
Hematology and Oncology Follow Up Visit  CHARLIEANN EVERARD [id number] 08/07/1941 75 y.o. 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  Pagdilao is back for followup.  She is doing great. She has not been phlebotomized for about 4 months now. Her iron studies done 3 days ago showed a ferritin of 13 with iron saturation of 37%.  She feels good. She's had a problem with fatigue or weakness. She's had no cough or shortness of breath. She's had no change in bowel or bladder habits.  Her husband currently is getting chemotherapy for bladder cancer. He's getting this at [facility].  She's had no bleeding. She's had no fever. She's had no leg swelling. She's had no rashes.  Overall, her performance status is ECOG 0.   Medications:  Current Outpatient Prescriptions:  .  aspirin 81 MG tablet, Take 81 mg by mouth daily., Disp: , Rfl:  .  Astaxanthin 4 MG CAPS, Take 1 capsule by mouth 2 (two) times daily., Disp: , Rfl:  .  Calcium Carbonate-Vitamin D (CALCIUM 500/D) 500-125 MG-UNIT TABS, Take by mouth.  , Disp: , Rfl:  .  Cholecalciferol (VITAMIN D3) 2000 UNITS TABS, Take by mouth every morning., Disp: , Rfl:  .  estradiol (ESTRACE) 0.1 MG/GM vaginal cream, Place 1 Applicatorful vaginally 2 (two) times a week. , Disp: , Rfl:  .  fish oil-omega-3 fatty acids 1000 MG capsule, Take 2 g by mouth daily.  , Disp: , Rfl:  .  levothyroxine (SYNTHROID, LEVOTHROID) 112 MCG tablet, TAKE 1 TABLET BY MOUTH EVERY DAY, Disp: 90 tablet, Rfl: 2 .  metroNIDAZOLE (METROCREAM) 0.75 % cream, Apply 1 application topically daily.  , Disp: , Rfl:  .  Multiple Vitamin (MULTIVITAMIN) tablet, Take 1 tablet by mouth daily.  , Disp: , Rfl:   Allergies:  Allergies  Allergen Reactions  . Tetracycline Rash    Past Medical History, Surgical history, Social history, and  Family History were reviewed and updated.  Review of Systems: As above  Physical Exam:  vitals were not taken for this visit.  Well-developed and well-nourished white female. Head and exam shows no ocular or oral lesions. She is no palpable cervical or supraclavicular lymph nodes. Lungs are clear. Cardiac exam regular rate and rhythm with no murmurs, rubs or bruits.. Abdomen is soft. She has good bowel sounds. There is no fluid wave. There is no palpable liver or spleen. Back exam shows no tenderness over the spine, ribs or hips. Extremities shows no clubbing cyanosis or edema. Skin exam shows no rashes, ecchymoses or petechia. Neurological exam with no focal deficits.  Lab Results  Component Value Date   WBC 3.3 (L) 07/21/2016   HGB 11.8 07/21/2016   HCT 34.4 (L) 07/21/2016   MCV 98 07/21/2016   PLT 211 07/21/2016     Chemistry      Component Value Date/Time   NA 138 07/21/2016 0847   NA 141 03/14/2016 0942   K 3.6 07/21/2016 0847   K 4.3 03/14/2016 0942   CL 98 07/21/2016 0847   CO2 28 07/21/2016 0847   CO2 29 03/14/2016 0942   BUN 11 07/21/2016 0847   BUN 16.0 03/14/2016 0942   CREATININE 0.7 07/21/2016 0847   CREATININE 0.8 03/14/2016 0942      Component Value Date/Time   CALCIUM 8.9  07/21/2016 0847   CALCIUM 9.6 03/14/2016 0942   ALKPHOS 59 07/21/2016 0847   ALKPHOS 58 03/14/2016 0942   AST 21 07/21/2016 0847   AST 16 03/14/2016 0942   ALT 19 07/21/2016 0847   ALT 13 03/14/2016 0942   BILITOT 0.60 07/21/2016 0847   BILITOT 0.55 03/14/2016 0942         Impression and Plan: Ms. Leppanen is 75 year old female with hemachromatosis. She is homozygous for the major  C282Y mutation.  She does not live phlebotomize right now. I think that we can get her through the holidays.  We will plan to see her back in 3 months. She will have her lab work done before see her back so that way we can phlebotomize her, if necessary we see her. I suspect we probably will need to  phlebotomize her we see her back.  [name], MD 11/2/20178:27 AM

## 2016-07-24 NOTE — Patient Instructions (Signed)

## 2016-07-29 IMAGING — CR DG LUMBAR SPINE COMPLETE 4+V
5 series · 5 of 5 positions shown · non-contrast
Comparison: None.

CLINICAL DATA: Right lumbar radiculitis.

EXAM:
LUMBAR SPINE - COMPLETE 4+ VIEW

[view not recorded (1 of 5)]
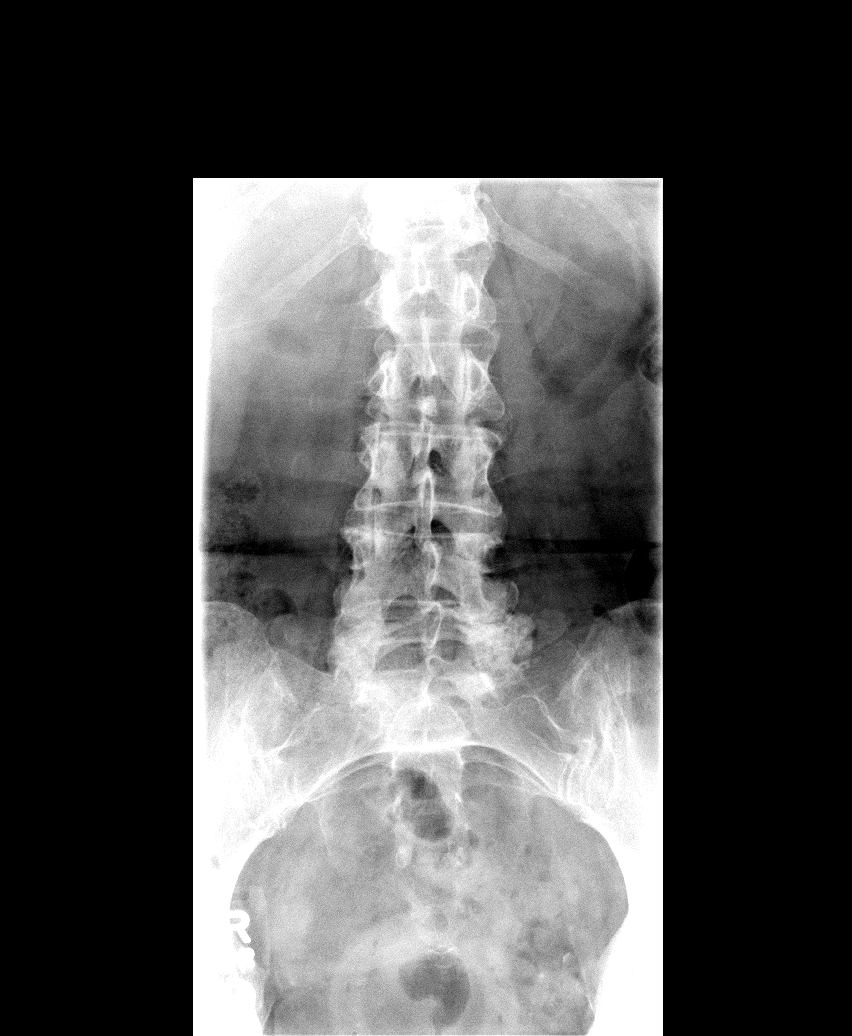

[view not recorded (2 of 5)]
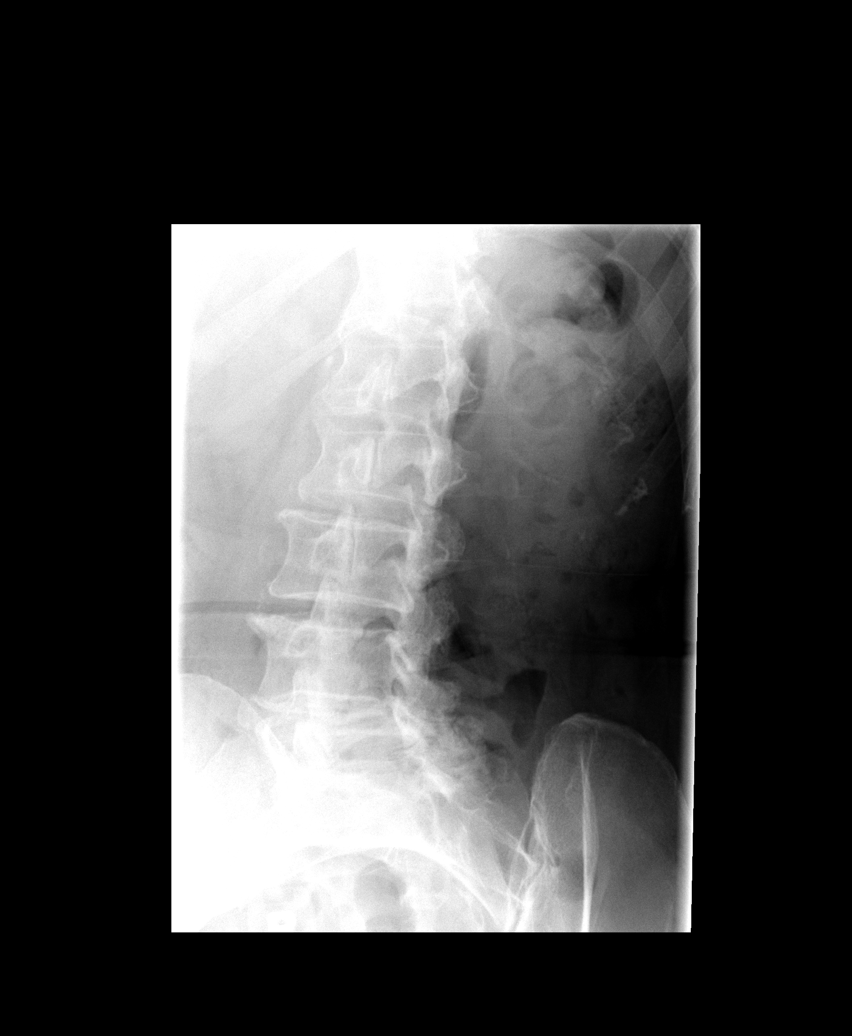

[view not recorded (3 of 5)]
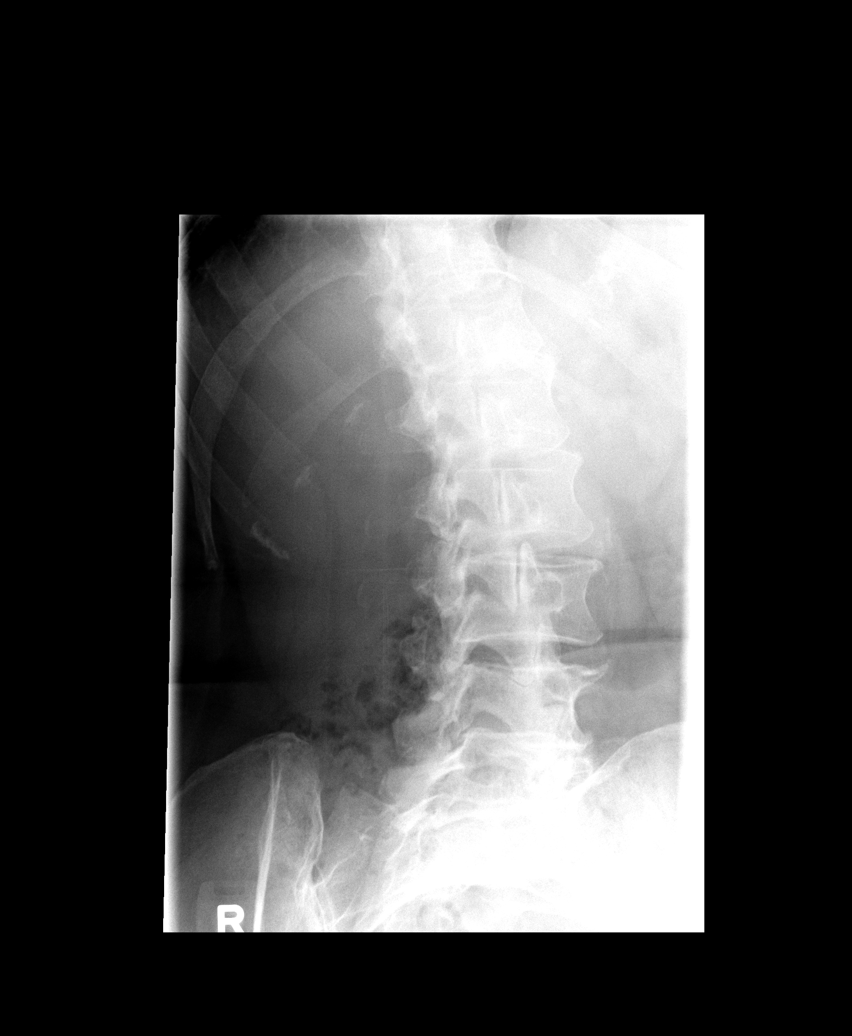

[view not recorded (4 of 5)]
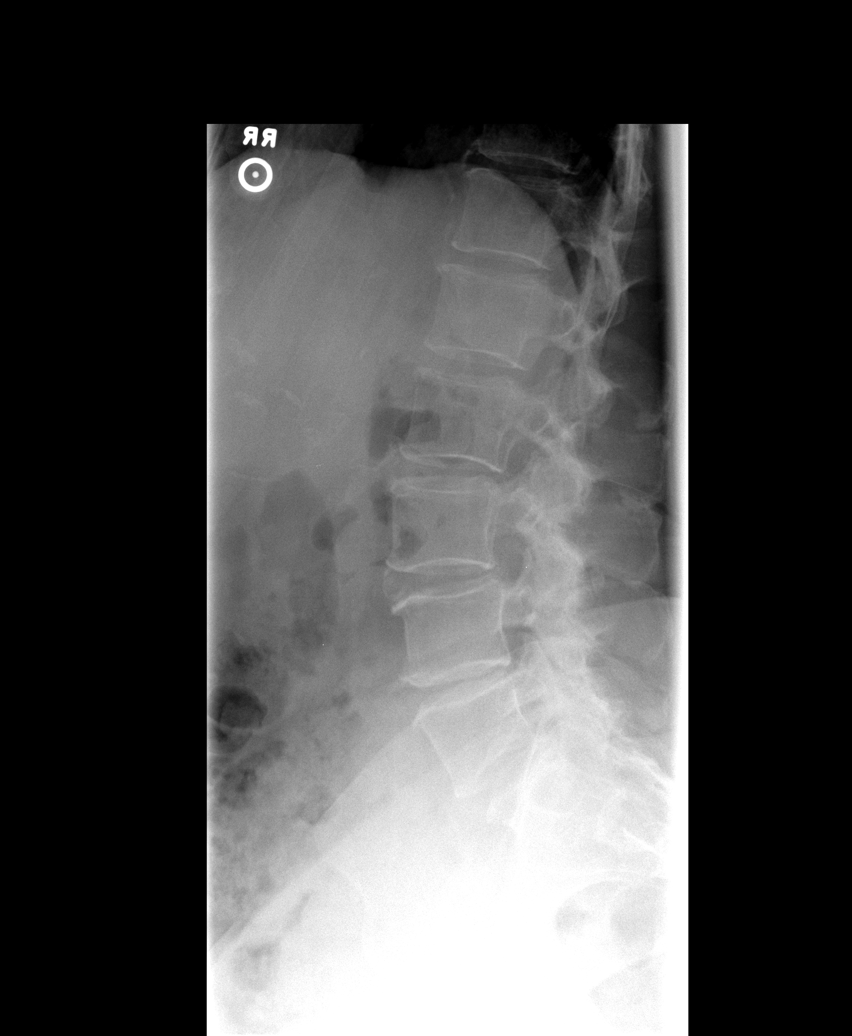

[view not recorded (5 of 5)]
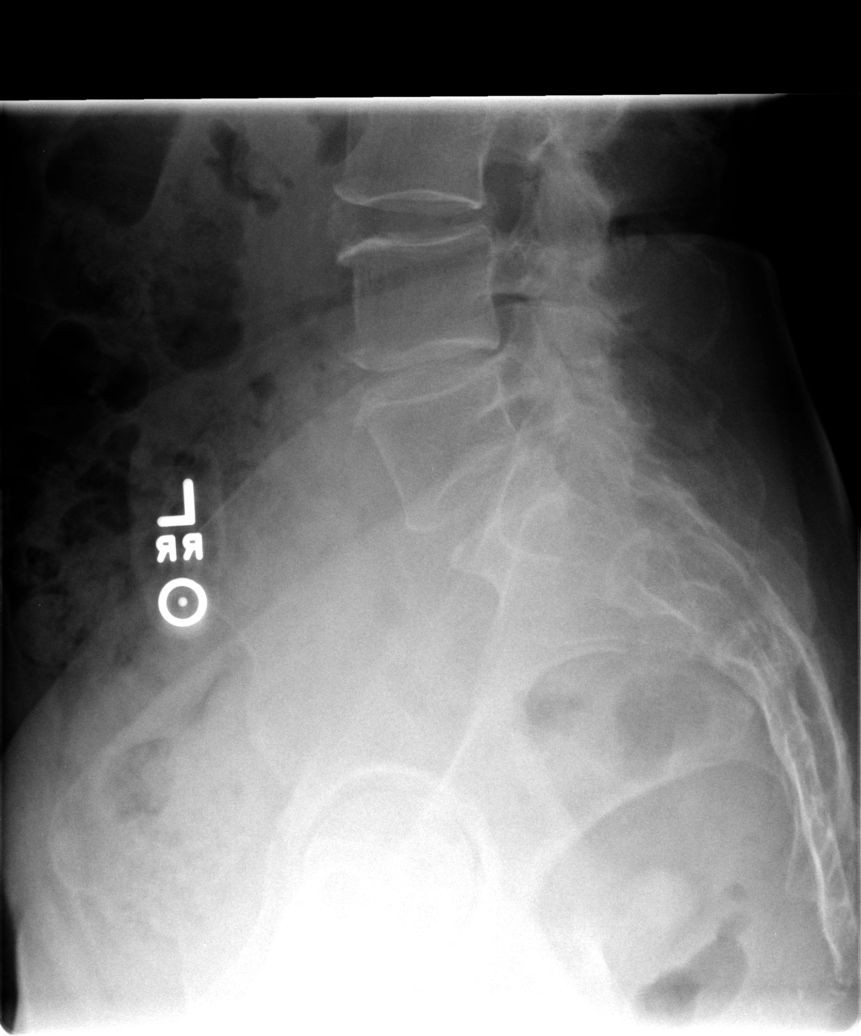

[5 of 5 positions shown; findings below may reference images not displayed]

FINDINGS: There is bilateral facet arthritis at L3-4, L4-5, and L5-S1.
Arthritis is most severe at L5-S1 on the left.

There is 2 mm spondylolisthesis of L3 on L4 due to facet arthritis.
There is no disc space narrowing or fracture.
IMPRESSION: Multilevel facet arthritis in the lower lumbar spine. Grade 1
spondylolisthesis of L3 on L4.

## 2016-08-17 ENCOUNTER — Other Ambulatory Visit: Payer: Self-pay | Admitting: Internal Medicine

## 2016-08-18 ENCOUNTER — Other Ambulatory Visit: Payer: Self-pay | Admitting: Internal Medicine

## 2016-08-18 ENCOUNTER — Telehealth: Payer: Self-pay | Admitting: Family Medicine

## 2016-08-18 NOTE — Telephone Encounter (Signed)
Sent to the pharmacy by e-scribe for 90 days.  Pt due in Dec for wellness and cpx.  Message sent to scheduling.

## 2016-08-18 NOTE — Telephone Encounter (Signed)
Pt due for wellness and cpx in December 17.  Please see if she will schedule wellness with [name] and cpx with Dr. Mamie Nick.  Thanks!!

## 2016-08-19 NOTE — Telephone Encounter (Signed)
lmom for pt to call back

## 2016-08-19 NOTE — Telephone Encounter (Signed)
Pt has been scheduled.  °

## 2016-08-20 NOTE — Telephone Encounter (Signed)
Duplicate message.  Sent in on 08/18/16.  Message sent to the pharmacy.

## 2016-09-08 ENCOUNTER — Other Ambulatory Visit (INDEPENDENT_AMBULATORY_CARE_PROVIDER_SITE_OTHER): Payer: Medicare Other

## 2016-09-08 DIAGNOSIS — Z0000 Encounter for general adult medical examination without abnormal findings: Secondary | ICD-10-CM | POA: Diagnosis not present

## 2016-09-08 LAB — CBC WITH DIFFERENTIAL/PLATELET
BASOS PCT: 0.8 % (ref 0.0–3.0)
Basophils Absolute: 0 10*3/uL (ref 0.0–0.1)
EOS ABS: 0 10*3/uL (ref 0.0–0.7)
EOS PCT: 1.2 % (ref 0.0–5.0)
HEMATOCRIT: 35 % — AB (ref 36.0–46.0)
HEMOGLOBIN: 12.3 g/dL (ref 12.0–15.0)
LYMPHS PCT: 31.3 % (ref 12.0–46.0)
Lymphs Abs: 1.2 10*3/uL (ref 0.7–4.0)
MCHC: 35.2 g/dL (ref 30.0–36.0)
MCV: 97.8 fl (ref 78.0–100.0)
MONOS PCT: 7 % (ref 3.0–12.0)
Monocytes Absolute: 0.3 10*3/uL (ref 0.1–1.0)
Neutro Abs: 2.3 10*3/uL (ref 1.4–7.7)
Neutrophils Relative %: 59.7 % (ref 43.0–77.0)
Platelets: 225 10*3/uL (ref 150.0–400.0)
RBC: 3.58 Mil/uL — ABNORMAL LOW (ref 3.87–5.11)
RDW: 14.6 % (ref 11.5–15.5)
WBC: 3.8 10*3/uL — AB (ref 4.0–10.5)

## 2016-09-08 LAB — BASIC METABOLIC PANEL
BUN: 13 mg/dL (ref 6–23)
CHLORIDE: 102 meq/L (ref 96–112)
CO2: 29 mEq/L (ref 19–32)
Calcium: 9.3 mg/dL (ref 8.4–10.5)
Creatinine, Ser: 0.71 mg/dL (ref 0.40–1.20)
GFR: 85.04 mL/min (ref >60.00)
Glucose, Bld: 93 mg/dL (ref 70–99)
POTASSIUM: 4.1 meq/L (ref 3.5–5.1)
SODIUM: 139 meq/L (ref 135–145)

## 2016-09-08 LAB — LIPID PANEL
CHOLESTEROL: 246 mg/dL — AB (ref 0–200)
HDL: 63.3 mg/dL (ref >39.00)
LDL CALC: 159 mg/dL — AB (ref 0–99)
NonHDL: 182.6
TRIGLYCERIDES: 116 mg/dL (ref 0.0–149.0)
Total CHOL/HDL Ratio: 4
VLDL: 23.2 mg/dL (ref 0.0–40.0)

## 2016-09-08 LAB — HEPATIC FUNCTION PANEL
ALT: 10 U/L (ref 0–35)
AST: 15 U/L (ref 0–37)
Albumin: 4.2 g/dL (ref 3.5–5.2)
Alkaline Phosphatase: 53 U/L (ref 39–117)
BILIRUBIN DIRECT: 0.1 mg/dL (ref 0.0–0.3)
TOTAL PROTEIN: 6.4 g/dL (ref 6.0–8.3)
Total Bilirubin: 0.5 mg/dL (ref 0.2–1.2)

## 2016-09-08 LAB — TSH: TSH: 1.9 u[IU]/mL (ref 0.35–4.50)

## 2016-09-09 ENCOUNTER — Other Ambulatory Visit: Payer: Medicare Other

## 2016-09-16 ENCOUNTER — Ambulatory Visit (INDEPENDENT_AMBULATORY_CARE_PROVIDER_SITE_OTHER): Payer: Medicare Other | Admitting: Internal Medicine

## 2016-09-16 ENCOUNTER — Encounter: Payer: Self-pay | Admitting: Internal Medicine

## 2016-09-16 VITALS — BP 132/60 | HR 67 | Temp 97.9°F | Ht 64.0 in | Wt 128.0 lb

## 2016-09-16 DIAGNOSIS — E785 Hyperlipidemia, unspecified: Secondary | ICD-10-CM | POA: Diagnosis not present

## 2016-09-16 DIAGNOSIS — E039 Hypothyroidism, unspecified: Secondary | ICD-10-CM

## 2016-09-16 DIAGNOSIS — Z0000 Encounter for general adult medical examination without abnormal findings: Secondary | ICD-10-CM

## 2016-09-16 NOTE — Progress Notes (Signed)
Pre visit review using our clinic review tool, if applicable. No additional management support is needed unless otherwise documented below in the visit note.  Subjective:   Angela Burgess is a 75 y.o. female who presents for Medicare Annual (Subsequent) preventive examination.  The Patient was informed that the wellness visit is to identify future health risk and educate and initiate measures that can reduce risk for increased disease through the lifespan.    NO ROS; Medicare Wellness Visit  And CPE today   Psychosocial Married x 50+ years 2 children;  [place] d[place] has 3 boys;   Describes health as good, fair or great? Good   Preventive Screening -Counseling & Management  Dexa: "osteopenia" 01/18/2014;  GYN Dr. Gaetano Net follows Tries to get enough calicum in food  Vit d one every day ( 2000u)   Colonoscopy 07/18/2010/ repeat in 10 years Mammogram 03/2013/ mammogram in August 2017 and completes every year  (cervial cancer is 31)  Smoking history/never smoked  Smokeless tobacco  Second Hand Smoke status; No Smokers in the home  ETOH 2 drinks a week/ does not drink at all   RISK FACTORS Regular exercise  Went to therapy several times Do "stretch" exercises; for legs, hips and back, lunge Does tai chi; 1 to 2 times a week;   Psychosocial (spouse has cancer of bladder; finished chemo)  (surgery in Jan 12 for spouse; Therefore eating is more random Accupuncture -  Diet on hold through her spouses illness   Fall risk no Mobility of Functional changes this year? No  Safety; community,home is a one level home but has a basement; has living area in basement  wears sunscreen has sunscreen in make up safe place for firearms;  Motor vehicle accidents; no   Cardiac Risk Factors:  Advanced aged  >51 in women Hyperlipidemia - CHO 246 trig 116; HDL 63 and LDL 159  Discussed low fat  Diabetes a1c 5.1  Family History (father had leukemia; HD; DM; other  had cirrhosis )  Other lung cancer and colon cancer)  Obesity  Eye exam/ my last eye exam;  Has cataracts; they are bothering her; can't read little things  fup due the 1st of the year / both eyes   Hearing: spouse thinks she is having issues  Hearing tested 4000hz    Depression Screen PhQ 2: negative  Activities of Daily Living - See functional screen   Cognitive testing; Ad8 score reviewed for issues:  Issues making decisions:  Less interest in hobbies / activities:  Repeats questions, stories (family complaining):  Trouble using ordinary gadgets (microwave, computer, phone):  Forgets the month or year:   Mismanaging finances:   Remembering appts:  Daily problems with thinking and/or memory: Ad8 score is= 0  Uses pessary; does kagels  Advanced Directives  To bring a copy to [place] at the next visit   Patient Care Team: Burnis Medin, MD as PCP - General Lafayette Dragon, MD (Inactive) (Gastroenterology) Everlene Farrier, MD as Attending Physician (Obstetrics and Gynecology) Bo Merino, MD (Rheumatology) [place]   savage [name], MD as Consulting Physician (Oncology)   Immunization History  Administered Date(s) Administered  . Influenza Split 06/22/2013  . Influenza Whole 07/07/2007, 07/05/2008, 07/23/2009, 05/21/2010  . Influenza,inj,Quad PF,36+ Mos 07/24/2016  . Influenza-Unspecified 07/06/2014  . Pneumococcal Conjugate-13 12/21/2013  . Pneumococcal Polysaccharide-23 01/31/2008  . Td 09/22/1997, 11/13/2009  . Zoster 05/21/2010   Required Immunizations needed today  Screening test up to  date or reviewed for plan of completion There are no preventive care reminders to display for this patient.  Cardiac Risk Factors include: advanced age (>29mn, >>33women)     Objective:     Vitals: BP 132/60   Pulse 67   Ht 5' 4"  (1.626 m)   Wt 128 lb (58.1 kg)   SpO2 98%   BMI 21.97 kg/m   Body mass index is 21.97 kg/m.   Tobacco History    Smoking Status  . Never Smoker  Smokeless Tobacco  . Never Used    Comment: never used tobacco     Counseling given: Yes   Past Medical History:  Diagnosis Date  . Anemia   . Arthritis   . [place]ervical cancer [place]  . Dexamethasone adverse reaction 2008  . Elevated MCV    for years  . Eye exam abnormal 6/09  . Family history of malignant neoplasm of gastrointestinal tract   . Gastritis   . History of mammogram 1/10  . History of normal resting EKG 2005  . Hyperlipidemia   . Hypothyroidism   . IBS (irritable bowel syndrome)   . Macrocytosis    with theme eval in past and normal b12  . Osteopenia   . Rectocele   . Rosacea   . Tetanus-diphtheria (Td) vaccination 1999   Past Surgical History:  Procedure Laterality Date  . ABDOMINAL HYSTERECTOMY    . APPENDECTOMY    . BREAST LUMPECTOMY     Family History  Problem Relation Age of Onset  . Cirrhosis Mother   . Kidney disease Mother   . Allergy (severe) Mother     protein allergy that sent posion to her brain  . Leukemia Father   . Heart attack Father 581 . Diabetes Father   . Bipolar disorder Sister   . Stroke Sister   . Lung cancer Brother     smoker  . Seizures Brother   . Bipolar disorder Son   . Colon cancer Brother   . Seizures Sister   . Seizures Sister    History  Sexual Activity  . Sexual activity: Not on file    Outpatient Encounter Prescriptions as of 09/16/2016  Medication Sig  . aspirin 81 MG tablet Take 81 mg by mouth daily.  . Astaxanthin 4 MG CAPS Take 1 capsule by mouth 2 (two) times daily.  . Calcium Carbonate-Vitamin D (CALCIUM 500/D) 500-125 MG-UNIT TABS Take by mouth.    . Cholecalciferol (VITAMIN D3) 2000 UNITS TABS Take by mouth every morning.  .Marland Kitchenestradiol (ESTRACE) 0.1 MG/GM vaginal cream Place 1 Applicatorful vaginally 2 (two) times a week.   . fish oil-omega-3 fatty acids 1000 MG capsule Take 2 g by mouth daily.    .Marland Kitchenlevothyroxine (SYNTHROID, LEVOTHROID) 112 MCG tablet TAKE 1  TABLET BY MOUTH EVERY DAY  . metroNIDAZOLE (METROCREAM) 0.75 % cream Apply 1 application topically daily.    . Multiple Vitamin (MULTIVITAMIN) tablet Take 1 tablet by mouth daily.     No facility-administered encounter medications on file as of 09/16/2016.     Activities of Daily Living In your present state of health, do you have any difficulty performing the following activities: 09/16/2016  Hearing? N  Vision? N  Difficulty concentrating or making decisions? N  Walking or climbing stairs? N  Dressing or bathing? N  Doing errands, shopping? N  Preparing Food and eating ? N  Using the Toilet? N  In the past six months, have you accidently  leaked urine? N  Do you have problems with loss of bowel control? N  Managing your Medications? N  Managing your Finances? N  Housekeeping or managing your Housekeeping? N  Some recent data might be hidden    Patient Care Team: Burnis Medin, MD as PCP - General Lafayette Dragon, MD (Inactive) (Gastroenterology) Everlene Farrier, MD as Attending Physician (Obstetrics and Gynecology) Bo Merino, MD (Rheumatology) [place]   savage [name], MD as Consulting Physician (Oncology)    Assessment:     Exercise Activities and Dietary recommendations Current Exercise Habits: Structured exercise class, Time (Minutes): 30, Frequency (Times/Week): 3, Weekly Exercise (Minutes/Week): 90, Intensity: Mild (will check on doing something more aerobic but has bladder prolapse )  Goals    . patient          Enjoy the daily journey when possible;       Fall Risk Fall Risk  09/16/2016 07/24/2016 04/21/2016 04/04/2016 03/28/2016  Falls in the past year? No No No No No   Depression Screen PHQ 2/9 Scores 09/16/2016 09/18/2015 09/05/2014 12/05/2013  PHQ - 2 Score 0 0 0 0     Cognitive Function MMSE - Mini Mental State Exam 09/16/2016  Not completed: (No Data)    Ad8 score 0    Immunization History  Administered Date(s) Administered  .  Influenza Split 06/22/2013  . Influenza Whole 07/07/2007, 07/05/2008, 07/23/2009, 05/21/2010  . Influenza,inj,Quad PF,36+ Mos 07/24/2016  . Influenza-Unspecified 07/06/2014  . Pneumococcal Conjugate-13 12/21/2013  . Pneumococcal Polysaccharide-23 01/31/2008  . Td 09/22/1997, 11/13/2009  . Zoster 05/21/2010   Screening Tests Health Maintenance  Topic Date Due  . TETANUS/TDAP  11/14/2019  . INFLUENZA VACCINE  Addressed  . DEXA SCAN  Completed  . ZOSTAVAX  Completed  . PNA vac Low Risk Adult  Completed      Plan:   PCP Notes  Health Maintenance Dr. Gaetano Net following mammograms annually and Dexa and Ua issues; has prolapse and pessary now  S/p hyst;  Abnormal Screens none  Referrals none;   Patient concerns; Discussed cholesterol  Discussed exercise with some aerobic as tolerated but not any that would stress her joints.   Nurse Concerns; Patient has no c/o; spouse has bladder cancer and to have surgery in Jan   Next PCP apt/ Dr. Regis Bill seeing today    During the course of the visit the patient was educated and counseled about the following appropriate screening and preventive services:   Vaccines to include Pneumoccal, Influenza, Hepatitis B, Td, Zostavax, HCV  Electrocardiogram  Cardiovascular Disease  Colorectal cancer screening  Bone density screening  Diabetes screening  Glaucoma screening  Mammography/PAP  Nutrition counseling   Patient Instructions (the written plan) was given to the patient.   [name], RN  09/16/2016

## 2016-09-16 NOTE — Progress Notes (Signed)
Chief Complaint  Patient presents with  . Medicare Wellness    HPI: Angela Burgess 75 y.o. comes in today for Preventive Medicare  visit . And AWV info with Angela Burgess Since last visit.  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  Thyroid : No change in meds  No major health change  Has had  mammo  Gyne exam etc  Had dex at gyne and ok   August?  Health Maintenance  Topic Date Due  . Samul Dada  11/14/2019  . INFLUENZA VACCINE  Addressed  . DEXA SCAN  Completed  . ZOSTAVAX  Completed  . PNA vac Low Risk Adult  Completed   Health Maintenance Review LIFESTYLE:  TAD no  Sugar beverages:   No stevia   Sleep:  About 8 and 9 hours  HH of 2   No pets tai chi   And exercises  stretching . Husband .Angela Burgess   To have bladder removed for cancer  .      ROS:  cehck indent at scar  Has cataracts  GEN/ HEENT: No fever, significant weight changes sweats headaches vision problems hearing changes, CV/ PULM; No chest pain shortness of breath cough, syncope,edema  change in exercise tolerance. GI /GU: No adominal pain, vomiting, change in bowel habits. No blood in the stool. No significant GU symptoms. SKIN/HEME: ,no acute skin rashes suspicious lesions or bleeding. No lymphadenopathy, nodules, masses.  NEURO/ PSYCH:  No neurologic signs such as weakness numbness. No depression anxiety. IMM/ Allergy: No unusual infections.  Allergy .   REST of 12 system review negative except as per HPI   Past Medical History:  Diagnosis Date  . Anemia   . Arthritis   . Cervical cancer ([place]) 1968  . Dexamethasone adverse reaction 2008  . Elevated MCV    for years  . Eye exam abnormal 6/09  . Family history of malignant neoplasm of gastrointestinal tract   . Gastritis   . History of mammogram 1/10  . History of normal resting EKG 2005  . Hyperlipidemia   . Hypothyroidism   . IBS (irritable bowel syndrome)   . Macrocytosis    with theme eval in past and normal b12  . Osteopenia   . Rectocele    . Rosacea   . Tetanus-diphtheria (Td) vaccination 1999    Family History  Problem Relation Age of Onset  . Cirrhosis Mother   . Kidney disease Mother   . Allergy (severe) Mother     protein allergy that sent posion to her brain  . Leukemia Father   . Heart attack Father 46  . Diabetes Father   . Bipolar disorder Sister   . Stroke Sister   . Lung cancer Brother     smoker  . Seizures Brother   . Bipolar disorder Son   . Colon cancer Brother   . Seizures Sister   . Seizures Sister     Social History   Social History  . Marital status: Married    Spouse name: N/A  . Number of children: 2  . Years of education: N/A   Occupational History  . retired    Social History Main Topics  . Smoking status: Never Smoker  . Smokeless tobacco: Never Used     Comment: never used tobacco  . Alcohol use 0.0 oz/week     Comment: 2 drinks a week   . Drug use: No  . Sexual activity: Not on file  Other Topics Concern  . 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 aspirin 81 MG tablet Take 81 mg by mouth daily.  . Astaxanthin 4 MG CAPS Take 1 capsule by mouth 2 (two) times daily.  . Calcium Carbonate-Vitamin D (CALCIUM 500/D) 500-125 MG-UNIT TABS Take by mouth.    . Cholecalciferol (VITAMIN D3) 2000 UNITS TABS Take by mouth every morning.  Angela Burgess estradiol (ESTRACE) 0.1 MG/GM vaginal cream Place 1 Applicatorful vaginally 2 (two) times a week.   . fish oil-omega-3 fatty acids 1000 MG capsule Take 2 g by mouth daily.    Angela Burgess levothyroxine (SYNTHROID, LEVOTHROID) 112 MCG tablet TAKE 1 TABLET BY MOUTH EVERY DAY  . metroNIDAZOLE (METROCREAM) 0.75 % cream Apply 1 application topically daily.    . Multiple Vitamin (MULTIVITAMIN) tablet Take 1 tablet by mouth daily.     No facility-administered encounter medications on file as of  09/16/2016.     EXAM:  BP 132/60   Pulse 67   Temp 97.9 F (36.6 C) (Oral)   Ht _0  (1.626 m)   Wt 128 lb (58.1 kg)   SpO2 98%   BMI 21.97 kg/m   Body mass index is 21.97 kg/m.  Physical Exam: Vital signs reviewed TMH:DQQI is a well-developed well-nourished alert cooperative   who appears stated age in no acute distress.  HEENT: normocephalic atraumatic , Eyes: PERRL EOM's full, conjunctiva clear, Nares: paten,t no deformity discharge or tenderness., Ears: no deformity EAC's clear TMs with normal landmarks. Mouth: clear OP, no lesions, edema.  Moist mucous membranes. Dentition in adequate repair. NECK: supple without masses, thyromegaly or bruits.Breast: normal by inspection . No dimpling, discharge, masses, tenderness or discharge . CHEST/PULM:  Clear to auscultation and percussion breath sounds equal no wheeze , rales or rhonchi. No chest wall deformities or tenderness. CV: PMI is nondisplaced, S1 S2 no gallops, murmurs, rubs. Peripheral pulses are full without delay.No JVD .  ABDOMEN: Bowel sounds normal nontender  No guard or rebound, no hepato splenomegal no CVA tenderness.    Deep  Indentation  Mid  lidne transverse scar but no mass or hernia obvious  Extremtities:  No clubbing cyanosis or edema, no acute joint swelling or redness no focal atrophy NEURO:  Oriented x3, cranial nerves 3-12 appear to be intact, no obvious focal weakness,gait within normal limits no abnormal reflexes or asymmetrical SKIN: No acute rashes normal turgor, color, no bruising or petechiae. PSYCH: Oriented, good eye contact, no obvious depression anxiety, cognition and judgment appear normal. LN: no cervical axillary inguinal adenopathy No noted deficits in memory, attention, and speech.   Lab Results  Component Value Date   WBC 3.8 (L) 09/08/2016   HGB 12.3 09/08/2016   HCT 35.0 (L) 09/08/2016   PLT 225.0 09/08/2016   GLUCOSE 93 09/08/2016   CHOL 246 (H) 09/08/2016   TRIG 116.0 09/08/2016    HDL 63.30 09/08/2016   LDLDIRECT 165.2 07/21/2012   LDLCALC 159 (H) 09/08/2016   ALT 10 09/08/2016   AST 15 09/08/2016   NA 139 09/08/2016   K 4.1 09/08/2016   CL 102 09/08/2016   CREATININE 0.71 09/08/2016   BUN 13 09/08/2016   CO2 29 09/08/2016   TSH 1.90 09/08/2016   HGBA1C 5.1 09/18/2015   Wt Readings from  Last 3 Encounters:  09/16/16 128 lb (58.1 kg)  07/24/16 128 lb 8 oz (58.3 kg)  04/21/16 127 lb (57.6 kg)   BP Readings from Last 3 Encounters:  09/16/16 132/60  07/24/16 (!) 140/51  04/21/16 (!) 125/53     ASSESSMENT AND PLAN:  Discussed the following assessment and plan:  Visit for preventive health examination  Hyperlipidemia, unspecified hyperlipidemia type - dsic rsik benefit  9 year 17 % age risk factor but neg fam hx  not interested in statin    risk more than benefit  Hypothyroidism, unspecified type - cont same dose of meds Overall review healthy continue attention to healthy diet exercise and health. Exam lab work in a year. Or as needed. Will follow up with specialist regarding other medical conditions. Patient Care Team: Burnis Medin, MD as PCP - General Lafayette Dragon, MD (Inactive) (Gastroenterology) Everlene Farrier, MD as Attending Physician (Obstetrics and Gynecology) Bo Merino, MD (Rheumatology) in[place]   savage [name], MD as Consulting Physician (Oncology)  Patient Instructions   Optimize healthy eating diet and exercise. Based on calculation you could consider  Adding statin medication   But age is you only risk factor .  PV  And labs in a year  .     Same  Thyroid dose .  Consider bone density if net done   By your GYNE.     Ms. Fry , Thank you for taking time to come for your Medicare Wellness Visit. I appreciate your ongoing commitment to your health goals. Please review the following plan we discussed and let me know if I can assist you in the future.   Educated regarding low chol  Cholesterol Cholesterol is a  fat. Your body needs a small amount of cholesterol. Cholesterol (plaque) may build up in your blood vessels (arteries). That makes you more likely to have a heart attack or stroke. You cannot feel your cholesterol level. Having a blood test is the only way to find out if your level is high. Keep your test results. Work with your doctor to keep your cholesterol at a good level. What do the results mean?  Total cholesterol is how much cholesterol is in your blood.  LDL is bad cholesterol. This is the type that can build up. Try to have low LDL.  HDL is good cholesterol. It cleans your blood vessels and carries LDL away. Try to have high HDL.  Triglycerides are fat that the body can store or burn for energy. What are good levels of cholesterol?  Total cholesterol below 200.  LDL below 100 is good for people who have health risks. LDL below 70 is good for people who have very high risks.  HDL above 40 is good. It is best to have HDL of 60 or higher.  Triglycerides below 150. How can I lower my cholesterol? Diet  Follow your diet program as told by your doctor.  Choose fish, white meat chicken, or [place] that is roasted or baked. Try not to eat red meat, fried foods, sausage, or lunch meats.  Eat lots of fresh fruits and vegetables.  Choose whole grains, beans, pasta, potatoes, and cereals.  Choose olive oil, corn oil, or canola oil. Only use small amounts.  Try not to eat butter, mayonnaise, shortening, or palm kernel oils.  Try not to eat foods with trans fats.  Choose low-fat or nonfat dairy foods.  Drink skim or nonfat milk.  Eat low-fat or nonfat yogurt and  cheeses.  Try not to drink whole milk or cream.  Try not to eat ice cream, egg yolks, or full-fat cheeses.  Healthy desserts include angel food cake, ginger snaps, animal crackers, hard candy, popsicles, and low-fat or nonfat frozen yogurt. Try not to eat pastries, cakes, pies, and cookies. Exercise  Follow your  exercise program as told by your doctor.  Be more active. Try gardening, walking, and taking the stairs.   Ask your doctor about ways that you can be more active. Medicine  Take over-the-counter and prescription medicines only as told by your doctor. This information is not intended to replace advice given to you by your health care provider. Make sure you discuss any questions you have with your health care provider. Document Released: 12/05/2008 Document Revised: 04/09/2016 Document Reviewed: 03/20/2016 Elsevier Interactive Patient Education  2017 [employer].   These are the goals we discussed: Goals    . patient          Enjoy the daily journey when possible;        This is a list of the screening recommended for you and due dates:  Health Maintenance  Topic Date Due  . Tetanus Vaccine  11/14/2019  . Flu Shot  Addressed  . DEXA scan (bone density measurement)  Completed  . Shingles Vaccine  Completed  . Pneumonia vaccines  Completed   Health Maintenance, Female Introduction Adopting a healthy lifestyle and getting preventive care can go a long way to promote health and wellness. Talk with your health care provider about what schedule of regular examinations is right for you. This is a good chance for you to check in with your provider about disease prevention and staying healthy. In between checkups, there are plenty of things you can do on your own. Experts have done a lot of research about which lifestyle changes and preventive measures are most likely to keep you healthy. Ask your health care provider for more information. Weight and diet Eat a healthy diet  Be sure to include plenty of vegetables, fruits, low-fat dairy products, and lean protein.  Do not eat a lot of foods high in solid fats, added sugars, or salt.  Get regular exercise. This is one of the most important things you can do for your health.  Most adults should exercise for at least 150 minutes each  week. The exercise should increase your heart rate and make you sweat (moderate-intensity exercise).  Most adults should also do strengthening exercises at least twice a week. This is in addition to the moderate-intensity exercise. Maintain a healthy weight  Body mass index (BMI) is a measurement that can be used to identify possible weight problems. It estimates body fat based on height and weight. Your health care provider can help determine your BMI and help you achieve or maintain a healthy weight.  For females 65 years of age and older:  A BMI below 18.5 is considered underweight.  A BMI of 18.5 to 24.9 is normal.  A BMI of 25 to 29.9 is considered overweight.  A BMI of 30 and above is considered obese. Watch levels of cholesterol and blood lipids  You should start having your blood tested for lipids and cholesterol at 76 years of age, then have this test every 5 years.  You may need to have your cholesterol levels checked more often if:  Your lipid or cholesterol levels are high.  You are older than 76 years of age.  You are at high  risk for heart disease. Cancer screening Lung Cancer  Lung cancer screening is recommended for adults 73-43 years old who are at high risk for lung cancer because of a history of smoking.  A yearly low-dose CT scan of the lungs is recommended for people who:  Currently smoke.  Have quit within the past 15 years.  Have at least a 30-pack-year history of smoking. A pack year is smoking an average of one pack of cigarettes a day for 1 year.  Yearly screening should continue until it has been 15 years since you quit.  Yearly screening should stop if you develop a health problem that would prevent you from having lung cancer treatment. Breast Cancer  Practice breast self-awareness. This means understanding how your breasts normally appear and feel.  It also means doing regular breast self-exams. Let your health care provider know about any  changes, no matter how small.  If you are in your 20s or 30s, you should have a clinical breast exam (CBE) by a health care provider every 1-3 years as part of a regular health exam.  If you are 35 or older, have a CBE every year. Also consider having a breast X-ray (mammogram) every year.  If you have a family history of breast cancer, talk to your health care provider about genetic screening.  If you are at high risk for breast cancer, talk to your health care provider about having an MRI and a mammogram every year.  Breast cancer gene (BRCA) assessment is recommended for women who have family members with BRCA-related cancers. BRCA-related cancers include:  Breast.  Ovarian.  Tubal.  Peritoneal cancers.  Results of the assessment will determine the need for genetic counseling and BRCA1 and BRCA2 testing. Cervical Cancer  Your health care provider may recommend that you be screened regularly for cancer of the pelvic organs (ovaries, uterus, and vagina). This screening involves a pelvic examination, including checking for microscopic changes to the surface of your cervix (Pap test). You may be encouraged to have this screening done every 3 years, beginning at age 78.  For women ages 30-65, health care providers may recommend pelvic exams and Pap testing every 3 years, or they may recommend the Pap and pelvic exam, combined with testing for human papilloma virus (HPV), every 5 years. Some types of HPV increase your risk of cervical cancer. Testing for HPV may also be done on women of any age with unclear Pap test results.  Other health care providers may not recommend any screening for nonpregnant women who are considered low risk for pelvic cancer and who do not have symptoms. Ask your health care provider if a screening pelvic exam is right for you.  If you have had past treatment for cervical cancer or a condition that could lead to cancer, you need Pap tests and screening for cancer  for at least 20 years after your treatment. If Pap tests have been discontinued, your risk factors (such as having a new sexual partner) need to be reassessed to determine if screening should resume. Some women have medical problems that increase the chance of getting cervical cancer. In these cases, your health care provider may recommend more frequent screening and Pap tests. Colorectal Cancer  This type of cancer can be detected and often prevented.  Routine colorectal cancer screening usually begins at 76 years of age and continues through 76 years of age.  Your health care provider may recommend screening at an earlier age if you have  risk factors for colon cancer.  Your health care provider may also recommend using home test kits to check for hidden blood in the stool.  A small camera at the end of a tube can be used to examine your colon directly (sigmoidoscopy or colonoscopy). This is done to check for the earliest forms of colorectal cancer.  Routine screening usually begins at age 37.  Direct examination of the colon should be repeated every 5-10 years through 76 years of age. However, you may need to be screened more often if early forms of precancerous polyps or small growths are found. Skin Cancer  Check your skin from head to toe regularly.  Tell your health care provider about any new moles or changes in moles, especially if there is a change in a mole's shape or color.  Also tell your health care provider if you have a mole that is larger than the size of a pencil eraser.  Always use sunscreen. Apply sunscreen liberally and repeatedly throughout the day.  Protect yourself by wearing long sleeves, pants, a wide-brimmed hat, and sunglasses whenever you are outside. Heart disease, diabetes, and high blood pressure  High blood pressure causes heart disease and increases the risk of stroke. High blood pressure is more likely to develop in:  People who have blood pressure in  the high end of the normal range (130-139/85-89 mm Hg).  People who are overweight or obese.  People who are African American.  If you are 21-80 years of age, have your blood pressure checked every 3-5 years. If you are 22 years of age or older, have your blood pressure checked every year. You should have your blood pressure measured twice-once when you are at a hospital or clinic, and once when you are not at a hospital or clinic. Record the average of the two measurements. To check your blood pressure when you are not at a hospital or clinic, you can use:  An automated blood pressure machine at a pharmacy.  A home blood pressure monitor.  If you are between 5 years and 40 years old, ask your health care provider if you should take aspirin to prevent strokes.  Have regular diabetes screenings. This involves taking a blood sample to check your fasting blood sugar level.  If you are at a normal weight and have a low risk for diabetes, have this test once every three years after 76 years of age.  If you are overweight and have a high risk for diabetes, consider being tested at a younger age or more often. Preventing infection Hepatitis B  If you have a higher risk for hepatitis B, you should be screened for this virus. You are considered at high risk for hepatitis B if:  You were born in a country where hepatitis B is common. Ask your health care provider which countries are considered high risk.  Your parents were born in a high-risk country, and you have not been immunized against hepatitis B (hepatitis B vaccine).  You have HIV or AIDS.  You use needles to inject street drugs.  You live with someone who has hepatitis B.  You have had sex with someone who has hepatitis B.  You get hemodialysis treatment.  You take certain medicines for conditions, including cancer, organ transplantation, and autoimmune conditions. Hepatitis C  Blood testing is recommended for:  Everyone  born from [birth year] through 1966.  Anyone with known risk factors for hepatitis C. Sexually transmitted infections (STIs)  You should  be screened for sexually transmitted infections (STIs) including gonorrhea and chlamydia if:  You are sexually active and are younger than 76 years of age.  You are older than 76 years of age and your health care provider tells you that you are at risk for this type of infection.  Your sexual activity has changed since you were last screened and you are at an increased risk for chlamydia or gonorrhea. Ask your health care provider if you are at risk.  If you do not have HIV, but are at risk, it may be recommended that you take a prescription medicine daily to prevent HIV infection. This is called pre-exposure prophylaxis (PrEP). You are considered at risk if:  You are sexually active and do not regularly use condoms or know the HIV status of your partner(s).  You take drugs by injection.  You are sexually active with a partner who has HIV. Talk with your health care provider about whether you are at high risk of being infected with HIV. If you choose to begin PrEP, you should first be tested for HIV. You should then be tested every 3 months for as long as you are taking PrEP. Pregnancy  If you are premenopausal and you may become pregnant, ask your health care provider about preconception counseling.  If you may become pregnant, take 400 to 800 micrograms (mcg) of folic acid every day.  If you want to prevent pregnancy, talk to your health care provider about birth control (contraception). Osteoporosis and menopause  Osteoporosis is a disease in which the bones lose minerals and strength with aging. This can result in serious bone fractures. Your risk for osteoporosis can be identified using a bone density scan.  If you are 31 years of age or older, or if you are at risk for osteoporosis and fractures, ask your health care provider if you should be  screened.  Ask your health care provider whether you should take a calcium or vitamin D supplement to lower your risk for osteoporosis.  Menopause may have certain physical symptoms and risks.  Hormone replacement therapy may reduce some of these symptoms and risks. Talk to your health care provider about whether hormone replacement therapy is right for you. Follow these instructions at home:  Schedule regular health, dental, and eye exams.  Stay current with your immunizations.  Do not use any tobacco products including cigarettes, chewing tobacco, or electronic cigarettes.  If you are pregnant, do not drink alcohol.  If you are breastfeeding, limit how much and how often you drink alcohol.  Limit alcohol intake to no more than 1 drink per day for nonpregnant women. One drink equals 12 ounces of beer, 5 ounces of wine, or 1 ounces of hard liquor.  Do not use street drugs.  Do not share needles.  Ask your health care provider for help if you need support or information about quitting drugs.  Tell your health care provider if you often feel depressed.  Tell your health care provider if you have ever been abused or do not feel safe at home. This information is not intended to replace advice given to you by your health care provider. Make sure you discuss any questions you have with your health care provider. Document Released: 03/24/2011 Document Revised: 02/14/2016 Document Reviewed: 06/12/2015  Fall Prevention in the Home Falls can cause injuries and can affect people from all age groups. There are many simple things that you can do to make your home safe and  to help prevent falls. What can I do on the outside of my home?  Regularly repair the edges of walkways and driveways and fix any cracks.  Remove high doorway thresholds.  Trim any shrubbery on the main path into your home.  Use bright outdoor lighting.  Clear walkways of debris and clutter, including tools and  rocks.  Regularly check that handrails are securely fastened and in good repair. Both sides of any steps should have handrails.  Install guardrails along the edges of any raised decks or porches.  Have leaves, snow, and ice cleared regularly.  Use sand or salt on walkways during winter months.  In the garage, clean up any spills right away, including grease or oil spills. What can I do in the bathroom?  Use night lights.  Install grab bars by the toilet and in the tub and shower. Do not use towel bars as grab bars.  Use non-skid mats or decals on the floor of the tub or shower.  If you need to sit down while you are in the shower, use a plastic, non-slip stool.  Keep the floor dry. Immediately clean up any water that spills on the floor.  Remove soap buildup in the tub or shower on a regular basis.  Attach bath mats securely with double-sided non-slip rug tape.  Remove throw rugs and other tripping hazards from the floor. What can I do in the bedroom?  Use night lights.  Make sure that a bedside light is easy to reach.  Do not use oversized bedding that drapes onto the floor.  Have a firm chair that has side arms to use for getting dressed.  Remove throw rugs and other tripping hazards from the floor. What can I do in the [place]?  Clean up any spills right away.  Avoid walking on wet floors.  Place frequently used items in easy-to-reach places.  If you need to reach for something above you, use a sturdy step stool that has a grab bar.  Keep electrical cables out of the way.  Do not use floor polish or wax that makes floors slippery. If you have to use wax, make sure that it is non-skid floor wax.  Remove throw rugs and other tripping hazards from the floor. What can I do in the stairways?  Do not leave any items on the stairs.  Make sure that there are handrails on both sides of the stairs. Fix handrails that are broken or loose. Make sure that handrails are  as long as the stairways.  Check any carpeting to make sure that it is firmly attached to the stairs. Fix any carpet that is loose or worn.  Avoid having throw rugs at the top or bottom of stairways, or secure the rugs with carpet tape to prevent them from moving.  Make sure that you have a light switch at the top of the stairs and the bottom of the stairs. If you do not have them, have them installed. What are some other fall prevention tips?  Wear closed-toe shoes that fit well and support your feet. Wear shoes that have rubber soles or low heels.  When you use a stepladder, make sure that it is completely opened and that the sides are firmly locked. Have someone hold the ladder while you are using it. Do not climb a closed stepladder.  Add color or contrast paint or tape to grab bars and handrails in your home. Place contrasting color strips on the first and  last steps.  Use mobility aids as needed, such as canes, walkers, scooters, and crutches.  Turn on lights if it is dark. Replace any light bulbs that burn out.  Set up furniture so that there are clear paths. Keep the furniture in the same spot.  Fix any uneven floor surfaces.  Choose a carpet design that does not hide the edge of steps of a stairway.  Be aware of any and all pets.  Review your medicines with your healthcare provider. Some medicines can cause dizziness or changes in blood pressure, which increase your risk of falling. Talk with your health care provider about other ways that you can decrease your risk of falls. This may include working with a physical therapist or trainer to improve your strength, balance, and endurance. This information is not intended to replace advice given to you by your health care provider. Make sure you discuss any questions you have with your health care provider. Document Released: 08/29/2002 Document Revised: 02/05/2016 Document Reviewed: 10/13/2014 Elsevier Interactive Patient  Education  2017 [employer].   2017 Elsevier   Document Released: 09/05/2000 Document Revised: 02/05/2016 Document Reviewed: 06/21/2015 Elsevier Interactive Patient Education  2017 [place]. Sharla Tankard M.D.

## 2016-09-16 NOTE — Patient Instructions (Addendum)
Optimize healthy eating diet and exercise. Based on calculation you could consider  Adding statin medication   But age is you only risk factor .  PV  And labs in a year  .     Same  Thyroid dose .  Consider bone density if net done   By your GYNE.     Angela [place] , Thank you for taking time to come for your Medicare Wellness Visit. I appreciate your ongoing commitment to your health goals. Please review the following plan we discussed and let me know if I can assist you in the future.   Educated regarding low chol  Cholesterol Cholesterol is a fat. Your body needs a small amount of cholesterol. Cholesterol (plaque) may build up in your blood vessels (arteries). That makes you more likely to have a heart attack or stroke. You cannot feel your cholesterol level. Having a blood test is the only way to find out if your level is high. Keep your test results. Work with your doctor to keep your cholesterol at a good level. What do the results mean?  Total cholesterol is how much cholesterol is in your blood.  LDL is bad cholesterol. This is the type that can build up. Try to have low LDL.  HDL is good cholesterol. It cleans your blood vessels and carries LDL away. Try to have high HDL.  Triglycerides are fat that the body can store or burn for energy. What are good levels of cholesterol?  Total cholesterol below 200.  LDL below 100 is good for people who have health risks. LDL below 70 is good for people who have very high risks.  HDL above 40 is good. It is best to have HDL of 60 or higher.  Triglycerides below 150. How can I lower my cholesterol? Diet  Follow your diet program as told by your doctor.  Choose fish, white meat chicken, or [place] that is roasted or baked. Try not to eat red meat, fried foods, sausage, or lunch meats.  Eat lots of fresh fruits and vegetables.  Choose whole grains, beans, pasta, potatoes, and cereals.  Choose olive oil, corn oil, or canola oil. Only  use small amounts.  Try not to eat butter, mayonnaise, shortening, or palm kernel oils.  Try not to eat foods with trans fats.  Choose low-fat or nonfat dairy foods.  Drink skim or nonfat milk.  Eat low-fat or nonfat yogurt and cheeses.  Try not to drink whole milk or cream.  Try not to eat ice cream, egg yolks, or full-fat cheeses.  Healthy desserts include angel food cake, ginger snaps, animal crackers, hard candy, popsicles, and low-fat or nonfat frozen yogurt. Try not to eat pastries, cakes, pies, and cookies. Exercise  Follow your exercise program as told by your doctor.  Be more active. Try gardening, walking, and taking the stairs.   Ask your doctor about ways that you can be more active. Medicine  Take over-the-counter and prescription medicines only as told by your doctor. This information is not intended to replace advice given to you by your health care provider. Make sure you discuss any questions you have with your health care provider. Document Released: 12/05/2008 Document Revised: 04/09/2016 Document Reviewed: 03/20/2016 Elsevier Interactive Patient Education  2017 [employer].   These are the goals we discussed: Goals    . patient          Enjoy the daily journey when possible;  This is a list of the screening recommended for you and due dates:  Health Maintenance  Topic Date Due  . Tetanus Vaccine  11/14/2019  . Flu Shot  Addressed  . DEXA scan (bone density measurement)  Completed  . Shingles Vaccine  Completed  . Pneumonia vaccines  Completed   Health Maintenance, Female Introduction Adopting a healthy lifestyle and getting preventive care can go a long way to promote health and wellness. Talk with your health care provider about what schedule of regular examinations is right for you. This is a good chance for you to check in with your provider about disease prevention and staying healthy. In between checkups, there are plenty of things  you can do on your own. Experts have done a lot of research about which lifestyle changes and preventive measures are most likely to keep you healthy. Ask your health care provider for more information. Weight and diet Eat a healthy diet  Be sure to include plenty of vegetables, fruits, low-fat dairy products, and lean protein.  Do not eat a lot of foods high in solid fats, added sugars, or salt.  Get regular exercise. This is one of the most important things you can do for your health.  Most adults should exercise for at least 150 minutes each week. The exercise should increase your heart rate and make you sweat (moderate-intensity exercise).  Most adults should also do strengthening exercises at least twice a week. This is in addition to the moderate-intensity exercise. Maintain a healthy weight  Body mass index (BMI) is a measurement that can be used to identify possible weight problems. It estimates body fat based on height and weight. Your health care provider can help determine your BMI and help you achieve or maintain a healthy weight.  For females 61 years of age and older:  A BMI below 18.5 is considered underweight.  A BMI of 18.5 to 24.9 is normal.  A BMI of 25 to 29.9 is considered overweight.  A BMI of 30 and above is considered obese. Watch levels of cholesterol and blood lipids  You should start having your blood tested for lipids and cholesterol at 76 years of age, then have this test every 5 years.  You may need to have your cholesterol levels checked more often if:  Your lipid or cholesterol levels are high.  You are older than 76 years of age.  You are at high risk for heart disease. Cancer screening Lung Cancer  Lung cancer screening is recommended for adults 94-29 years old who are at high risk for lung cancer because of a history of smoking.  A yearly low-dose CT scan of the lungs is recommended for people who:  Currently smoke.  Have quit within  the past 15 years.  Have at least a 30-pack-year history of smoking. A pack year is smoking an average of one pack of cigarettes a day for 1 year.  Yearly screening should continue until it has been 15 years since you quit.  Yearly screening should stop if you develop a health problem that would prevent you from having lung cancer treatment. Breast Cancer  Practice breast self-awareness. This means understanding how your breasts normally appear and feel.  It also means doing regular breast self-exams. Let your health care provider know about any changes, no matter how small.  If you are in your 20s or 30s, you should have a clinical breast exam (CBE) by a health care provider every 1-3 years as  part of a regular health exam.  If you are 40 or older, have a CBE every year. Also consider having a breast X-ray (mammogram) every year.  If you have a family history of breast cancer, talk to your health care provider about genetic screening.  If you are at high risk for breast cancer, talk to your health care provider about having an MRI and a mammogram every year.  Breast cancer gene (BRCA) assessment is recommended for women who have family members with BRCA-related cancers. BRCA-related cancers include:  Breast.  Ovarian.  Tubal.  Peritoneal cancers.  Results of the assessment will determine the need for genetic counseling and BRCA1 and BRCA2 testing. Cervical Cancer  Your health care provider may recommend that you be screened regularly for cancer of the pelvic organs (ovaries, uterus, and vagina). This screening involves a pelvic examination, including checking for microscopic changes to the surface of your cervix (Pap test). You may be encouraged to have this screening done every 3 years, beginning at age 69.  For women ages 60-65, health care providers may recommend pelvic exams and Pap testing every 3 years, or they may recommend the Pap and pelvic exam, combined with testing for  human papilloma virus (HPV), every 5 years. Some types of HPV increase your risk of cervical cancer. Testing for HPV may also be done on women of any age with unclear Pap test results.  Other health care providers may not recommend any screening for nonpregnant women who are considered low risk for pelvic cancer and who do not have symptoms. Ask your health care provider if a screening pelvic exam is right for you.  If you have had past treatment for cervical cancer or a condition that could lead to cancer, you need Pap tests and screening for cancer for at least 20 years after your treatment. If Pap tests have been discontinued, your risk factors (such as having a new sexual partner) need to be reassessed to determine if screening should resume. Some women have medical problems that increase the chance of getting cervical cancer. In these cases, your health care provider may recommend more frequent screening and Pap tests. Colorectal Cancer  This type of cancer can be detected and often prevented.  Routine colorectal cancer screening usually begins at 76 years of age and continues through 76 years of age.  Your health care provider may recommend screening at an earlier age if you have risk factors for colon cancer.  Your health care provider may also recommend using home test kits to check for hidden blood in the stool.  A small camera at the end of a tube can be used to examine your colon directly (sigmoidoscopy or colonoscopy). This is done to check for the earliest forms of colorectal cancer.  Routine screening usually begins at age 57.  Direct examination of the colon should be repeated every 5-10 years through 76 years of age. However, you may need to be screened more often if early forms of precancerous polyps or small growths are found. Skin Cancer  Check your skin from head to toe regularly.  Tell your health care provider about any new moles or changes in moles, especially if there  is a change in a mole's shape or color.  Also tell your health care provider if you have a mole that is larger than the size of a pencil eraser.  Always use sunscreen. Apply sunscreen liberally and repeatedly throughout the day.  Protect yourself by wearing long sleeves,  pants, a wide-brimmed hat, and sunglasses whenever you are outside. Heart disease, diabetes, and high blood pressure  High blood pressure causes heart disease and increases the risk of stroke. High blood pressure is more likely to develop in:  People who have blood pressure in the high end of the normal range (130-139/85-89 mm Hg).  People who are overweight or obese.  People who are African American.  If you are 56-68 years of age, have your blood pressure checked every 3-5 years. If you are 36 years of age or older, have your blood pressure checked every year. You should have your blood pressure measured twice-once when you are at a hospital or clinic, and once when you are not at a hospital or clinic. Record the average of the two measurements. To check your blood pressure when you are not at a hospital or clinic, you can use:  An automated blood pressure machine at a pharmacy.  A home blood pressure monitor.  If you are between 36 years and 28 years old, ask your health care provider if you should take aspirin to prevent strokes.  Have regular diabetes screenings. This involves taking a blood sample to check your fasting blood sugar level.  If you are at a normal weight and have a low risk for diabetes, have this test once every three years after 76 years of age.  If you are overweight and have a high risk for diabetes, consider being tested at a younger age or more often. Preventing infection Hepatitis B  If you have a higher risk for hepatitis B, you should be screened for this virus. You are considered at high risk for hepatitis B if:  You were born in a country where hepatitis B is common. Ask your health  care provider which countries are considered high risk.  Your parents were born in a high-risk country, and you have not been immunized against hepatitis B (hepatitis B vaccine).  You have HIV or AIDS.  You use needles to inject street drugs.  You live with someone who has hepatitis B.  You have had sex with someone who has hepatitis B.  You get hemodialysis treatment.  You take certain medicines for conditions, including cancer, organ transplantation, and autoimmune conditions. Hepatitis C  Blood testing is recommended for:  Everyone born from [birth year] through 1966.  Anyone with known risk factors for hepatitis C. Sexually transmitted infections (STIs)  You should be screened for sexually transmitted infections (STIs) including gonorrhea and chlamydia if:  You are sexually active and are younger than 76 years of age.  You are older than 76 years of age and your health care provider tells you that you are at risk for this type of infection.  Your sexual activity has changed since you were last screened and you are at an increased risk for chlamydia or gonorrhea. Ask your health care provider if you are at risk.  If you do not have HIV, but are at risk, it may be recommended that you take a prescription medicine daily to prevent HIV infection. This is called pre-exposure prophylaxis (PrEP). You are considered at risk if:  You are sexually active and do not regularly use condoms or know the HIV status of your partner(s).  You take drugs by injection.  You are sexually active with a partner who has HIV. Talk with your health care provider about whether you are at high risk of being infected with HIV. If you choose to begin PrEP, you  should first be tested for HIV. You should then be tested every 3 months for as long as you are taking PrEP. Pregnancy  If you are premenopausal and you may become pregnant, ask your health care provider about preconception counseling.  If you may  become pregnant, take 400 to 800 micrograms (mcg) of folic acid every day.  If you want to prevent pregnancy, talk to your health care provider about birth control (contraception). Osteoporosis and menopause  Osteoporosis is a disease in which the bones lose minerals and strength with aging. This can result in serious bone fractures. Your risk for osteoporosis can be identified using a bone density scan.  If you are 61 years of age or older, or if you are at risk for osteoporosis and fractures, ask your health care provider if you should be screened.  Ask your health care provider whether you should take a calcium or vitamin D supplement to lower your risk for osteoporosis.  Menopause may have certain physical symptoms and risks.  Hormone replacement therapy may reduce some of these symptoms and risks. Talk to your health care provider about whether hormone replacement therapy is right for you. Follow these instructions at home:  Schedule regular health, dental, and eye exams.  Stay current with your immunizations.  Do not use any tobacco products including cigarettes, chewing tobacco, or electronic cigarettes.  If you are pregnant, do not drink alcohol.  If you are breastfeeding, limit how much and how often you drink alcohol.  Limit alcohol intake to no more than 1 drink per day for nonpregnant women. One drink equals 12 ounces of beer, 5 ounces of wine, or 1 ounces of hard liquor.  Do not use street drugs.  Do not share needles.  Ask your health care provider for help if you need support or information about quitting drugs.  Tell your health care provider if you often feel depressed.  Tell your health care provider if you have ever been abused or do not feel safe at home. This information is not intended to replace advice given to you by your health care provider. Make sure you discuss any questions you have with your health care provider. Document Released: 03/24/2011  Document Revised: 02/14/2016 Document Reviewed: 06/12/2015  Fall Prevention in the Home Falls can cause injuries and can affect people from all age groups. There are many simple things that you can do to make your home safe and to help prevent falls. What can I do on the outside of my home?  Regularly repair the edges of walkways and driveways and fix any cracks.  Remove high doorway thresholds.  Trim any shrubbery on the main path into your home.  Use bright outdoor lighting.  Clear walkways of debris and clutter, including tools and rocks.  Regularly check that handrails are securely fastened and in good repair. Both sides of any steps should have handrails.  Install guardrails along the edges of any raised decks or porches.  Have leaves, snow, and ice cleared regularly.  Use sand or salt on walkways during winter months.  In the garage, clean up any spills right away, including grease or oil spills. What can I do in the bathroom?  Use night lights.  Install grab bars by the toilet and in the tub and shower. Do not use towel bars as grab bars.  Use non-skid mats or decals on the floor of the tub or shower.  If you need to sit down while you are in  the shower, use a plastic, non-slip stool.  Keep the floor dry. Immediately clean up any water that spills on the floor.  Remove soap buildup in the tub or shower on a regular basis.  Attach bath mats securely with double-sided non-slip rug tape.  Remove throw rugs and other tripping hazards from the floor. What can I do in the bedroom?  Use night lights.  Make sure that a bedside light is easy to reach.  Do not use oversized bedding that drapes onto the floor.  Have a firm chair that has side arms to use for getting dressed.  Remove throw rugs and other tripping hazards from the floor. What can I do in the kitchen?  Clean up any spills right away.  Avoid walking on wet floors.  Place frequently used items in  easy-to-reach places.  If you need to reach for something above you, use a sturdy step stool that has a grab bar.  Keep electrical cables out of the way.  Do not use floor polish or wax that makes floors slippery. If you have to use wax, make sure that it is non-skid floor wax.  Remove throw rugs and other tripping hazards from the floor. What can I do in the stairways?  Do not leave any items on the stairs.  Make sure that there are handrails on both sides of the stairs. Fix handrails that are broken or loose. Make sure that handrails are as long as the stairways.  Check any carpeting to make sure that it is firmly attached to the stairs. Fix any carpet that is loose or worn.  Avoid having throw rugs at the top or bottom of stairways, or secure the rugs with carpet tape to prevent them from moving.  Make sure that you have a light switch at the top of the stairs and the bottom of the stairs. If you do not have them, have them installed. What are some other fall prevention tips?  Wear closed-toe shoes that fit well and support your feet. Wear shoes that have rubber soles or low heels.  When you use a stepladder, make sure that it is completely opened and that the sides are firmly locked. Have someone hold the ladder while you are using it. Do not climb a closed stepladder.  Add color or contrast paint or tape to grab bars and handrails in your home. Place contrasting color strips on the first and last steps.  Use mobility aids as needed, such as canes, walkers, scooters, and crutches.  Turn on lights if it is dark. Replace any light bulbs that burn out.  Set up furniture so that there are clear paths. Keep the furniture in the same spot.  Fix any uneven floor surfaces.  Choose a carpet design that does not hide the edge of steps of a stairway.  Be aware of any and all pets.  Review your medicines with your healthcare provider. Some medicines can cause dizziness or changes in  blood pressure, which increase your risk of falling. Talk with your health care provider about other ways that you can decrease your risk of falls. This may include working with a physical therapist or trainer to improve your strength, balance, and endurance. This information is not intended to replace advice given to you by your health care provider. Make sure you discuss any questions you have with your health care provider. Document Released: 08/29/2002 Document Revised: 02/05/2016 Document Reviewed: 10/13/2014 Elsevier Interactive Patient Education  2017 [employer].  2017 Elsevier   Document Released: 09/05/2000 Document Revised: 02/05/2016 Document Reviewed: 06/21/2015 Elsevier Interactive Patient Education  2017 [employer].

## 2016-11-03 ENCOUNTER — Other Ambulatory Visit (HOSPITAL_BASED_OUTPATIENT_CLINIC_OR_DEPARTMENT_OTHER): Payer: Medicare Other

## 2016-11-03 LAB — IRON AND TIBC
%SAT: 70 % — ABNORMAL HIGH (ref 21–57)
Iron: 159 ug/dL — ABNORMAL HIGH (ref 41–142)
TIBC: 228 ug/dL — AB (ref 236–444)
UIBC: 68 ug/dL — AB (ref 120–384)

## 2016-11-03 LAB — CBC WITH DIFFERENTIAL (CANCER CENTER ONLY)
BASO#: 0 10*3/uL (ref 0.0–0.2)
BASO%: 1.1 % (ref 0.0–2.0)
EOS%: 2.2 % (ref 0.0–7.0)
Eosinophils Absolute: 0.1 10*3/uL (ref 0.0–0.5)
HCT: 35.2 % (ref 34.8–46.6)
HEMOGLOBIN: 12.5 g/dL (ref 11.6–15.9)
LYMPH#: 1.3 10*3/uL (ref 0.9–3.3)
LYMPH%: 36 % (ref 14.0–48.0)
MCH: 36.1 pg — ABNORMAL HIGH (ref 26.0–34.0)
MCHC: 35.5 g/dL (ref 32.0–36.0)
MCV: 102 fL — ABNORMAL HIGH (ref 81–101)
MONO#: 0.3 10*3/uL (ref 0.1–0.9)
MONO%: 9.1 % (ref 0.0–13.0)
NEUT%: 51.6 % (ref 39.6–80.0)
NEUTROS ABS: 1.9 10*3/uL (ref 1.5–6.5)
PLATELETS: 206 10*3/uL (ref 145–400)
RBC: 3.46 10*6/uL — AB (ref 3.70–5.32)
RDW: 11.3 % (ref 11.1–15.7)
WBC: 3.7 10*3/uL — AB (ref 3.9–10.0)

## 2016-11-03 LAB — COMPREHENSIVE METABOLIC PANEL
ALT: 10 U/L (ref 0–55)
ANION GAP: 6 meq/L (ref 3–11)
AST: 17 U/L (ref 5–34)
Albumin: 3.9 g/dL (ref 3.5–5.0)
Alkaline Phosphatase: 59 U/L (ref 40–150)
BUN: 15.7 mg/dL (ref 7.0–26.0)
CALCIUM: 9.6 mg/dL (ref 8.4–10.4)
CHLORIDE: 105 meq/L (ref 98–109)
CO2: 28 mEq/L (ref 22–29)
Creatinine: 0.8 mg/dL (ref 0.6–1.1)
EGFR: 76 mL/min/{1.73_m2} — AB (ref >90)
Glucose: 88 mg/dl (ref 70–140)
POTASSIUM: 4.3 meq/L (ref 3.5–5.1)
Sodium: 140 mEq/L (ref 136–145)
Total Bilirubin: 0.49 mg/dL (ref 0.20–1.20)
Total Protein: 6.7 g/dL (ref 6.4–8.3)

## 2016-11-03 LAB — FERRITIN: FERRITIN: 16 ng/mL (ref 9–269)

## 2016-11-04 ENCOUNTER — Telehealth: Payer: Self-pay | Admitting: *Deleted

## 2016-11-04 NOTE — Telephone Encounter (Addendum)
-----   Message from [name], MD sent at 11/03/2016 12:45 PM EST ----- Call - the iron saturation is a little high, even thought the blod iron is ok!!  We need 1 phlebotomy in 1-2 week!!! Appointment made for next Wednesday for phlebotomy [place]

## 2016-11-06 ENCOUNTER — Ambulatory Visit (HOSPITAL_BASED_OUTPATIENT_CLINIC_OR_DEPARTMENT_OTHER): Payer: Medicare Other | Admitting: Hematology & Oncology

## 2016-11-06 NOTE — Progress Notes (Signed)
Hematology and Oncology Follow Up Visit  HLEE DAWES [id number] 1940/12/05 76 y.o. 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  Capeles is back for followup.  She enjoyed all the holidays. She had a nice Valentine's Day yesterday.  She's had no complaints. Her husband has been doing well. He has superficial bladder cancer. He is being treated at [facility] for this.  Ms. Howdyshell had iron studies done a couple days ago. Her ferritin was only 16 but her iron saturation was 70%. This is just too high.  She is watch what she eats. She and her husband are actually going to go to [facility] for breakfast after her appointment today.  She has had no problems with bowels or bladder. She has had no abdominal pain. She's had no cough. She's gone through the wintertime without the flu.  Hopefully, they will still go on the [place] river cruise this summer.   Overall, her performance status is ECOG 0.   Medications:  Current Outpatient Prescriptions:  .  aspirin 81 MG tablet, Take 81 mg by mouth daily., Disp: , Rfl:  .  Astaxanthin 4 MG CAPS, Take 1 capsule by mouth 2 (two) times daily., Disp: , Rfl:  .  Calcium Carbonate-Vitamin D (CALCIUM 500/D) 500-125 MG-UNIT TABS, Take by mouth.  , Disp: , Rfl:  .  Cholecalciferol (VITAMIN D3) 2000 UNITS TABS, Take by mouth every morning., Disp: , Rfl:  .  estradiol (ESTRACE) 0.1 MG/GM vaginal cream, Place 1 Applicatorful vaginally 2 (two) times a week. , Disp: , Rfl:  .  fish oil-omega-3 fatty acids 1000 MG capsule, Take 2 g by mouth daily.  , Disp: , Rfl:  .  levothyroxine (SYNTHROID, LEVOTHROID) 112 MCG tablet, TAKE 1 TABLET BY MOUTH EVERY DAY, Disp: 90 tablet, Rfl: 0 .  metroNIDAZOLE (METROCREAM) 0.75 % cream, Apply 1 application topically daily.  , Disp: , Rfl:  .  Multiple Vitamin  (MULTIVITAMIN) tablet, Take 1 tablet by mouth daily.  , Disp: , Rfl:   Allergies:  Allergies  Allergen Reactions  . Tetracycline Rash    Past Medical History, Surgical history, Social history, and Family History were reviewed and updated.  Review of Systems: As above  Physical Exam:  oral temperature is 97.8 F (36.6 C). Her blood pressure is 134/53 (abnormal) and her pulse is 59 (abnormal). Her respiration is 17 and oxygen saturation is 100%.   Well-developed and well-nourished white female. Head and exam shows no ocular or oral lesions. She is no palpable cervical or supraclavicular lymph nodes. Lungs are clear. Cardiac exam regular rate and rhythm with no murmurs, rubs or bruits.. Abdomen is soft. She has good bowel sounds. There is no fluid wave. There is no palpable liver or spleen. Back exam shows no tenderness over the spine, ribs or hips. Extremities shows no clubbing cyanosis or edema. Skin exam shows no rashes, ecchymoses or petechia. Neurological exam with no focal deficits.  Lab Results  Component Value Date   WBC 3.7 (L) 11/03/2016   HGB 12.5 11/03/2016   HCT 35.2 11/03/2016   MCV 102 (H) 11/03/2016   PLT 206 11/03/2016     Chemistry      Component Value Date/Time   NA 140 11/03/2016 0806   K 4.3 11/03/2016 0806   CL 102 09/08/2016 0916  CL 98 07/21/2016 0847   CO2 28 11/03/2016 0806   BUN 15.7 11/03/2016 0806   CREATININE 0.8 11/03/2016 0806      Component Value Date/Time   CALCIUM 9.6 11/03/2016 0806   ALKPHOS 59 11/03/2016 0806   AST 17 11/03/2016 0806   ALT 10 11/03/2016 0806   BILITOT 0.49 11/03/2016 0806         Impression and Plan: Ms. Heimann is 76 year old female with hemachromatosis. She is homozygous for the major  C282Y mutation.  Given that her iron saturation is 70%, we will go ahead and phlebotomize her. I want to keep her iron saturation less than 50%.  We will then plan to get her back to see [place] in 3 months. As always, we will get  labs a week before we see her.  [name], MD 2/15/20189:04 AM

## 2016-11-10 ENCOUNTER — Other Ambulatory Visit: Payer: Self-pay | Admitting: Internal Medicine

## 2016-11-12 ENCOUNTER — Other Ambulatory Visit: Payer: Self-pay | Admitting: Family

## 2016-11-12 ENCOUNTER — Ambulatory Visit (HOSPITAL_BASED_OUTPATIENT_CLINIC_OR_DEPARTMENT_OTHER): Payer: Medicare Other

## 2016-11-12 MED ORDER — SODIUM CHLORIDE 0.9 % IV SOLN
Freq: Once | INTRAVENOUS | Status: AC
  Administered 2016-11-12: 10:00:00 via INTRAVENOUS

## 2016-11-12 NOTE — Progress Notes (Signed)
Angela Burgess presents today for phlebotomy per MD orders. Phlebotomy procedure started at 0955 and ended at 1020. 500grams removed. Patient observed for 30 minutes after procedure without any incident. Patient tolerated procedure well. IV needle removed intact.

## 2016-11-12 NOTE — Patient Instructions (Signed)
     Therapeutic Phlebotomy, Care After Refer to this sheet in the next few weeks. These instructions provide you with information about caring for yourself after your procedure. Your health care provider may also give you more specific instructions. Your treatment has been planned according to current medical practices, but problems sometimes occur. Call your health care provider if you have any problems or questions after your procedure. What can I expect after the procedure? After the procedure, it is common to have:  Light-headedness or dizziness. You may feel faint.  Nausea.  Tiredness. Follow these instructions at home: Activity  Return to your normal activities as directed by your health care provider. Most people can go back to their normal activities right away.  Avoid strenuous physical activity and heavy lifting or pulling for about 5 hours after the procedure. Do not lift anything that is heavier than 10 lb (4.5 kg).  Athletes should avoid strenuous exercise for at least 12 hours.  Change positions slowly for the remainder of the day. This will help to prevent light-headedness or fainting.  If you feel light-headed, lie down until the feeling goes away. Eating and drinking  Be sure to eat well-balanced meals for the next 24 hours.  Drink enough fluid to keep your urine clear or pale yellow.  Avoid drinking alcohol on the day that you had the procedure. Care of the Needle Insertion Site  Keep your bandage dry. You can remove the bandage after about 5 hours or as directed by your health care provider.  If you have bleeding from the needle insertion site, elevate your arm and press firmly on the site until the bleeding stops.  If you have bruising at the site, apply ice to the area:  Put ice in a plastic bag.  Place a towel between your skin and the bag.  Leave the ice on for 20 minutes, 2-3 times a day for the first 24 hours.  If the swelling does not go away  after 24 hours, apply a warm, moist washcloth to the area for 20 minutes, 2-3 times a day. General instructions  Avoid smoking for at least 30 minutes after the procedure.  Keep all follow-up visits as directed by your health care provider. It is important to continue with further therapeutic phlebotomy treatments as directed. Contact a health care provider if:  You have redness, swelling, or pain at the needle insertion site.  You have fluid, blood, or pus coming from the needle insertion site.  You feel light-headed, dizzy, or nauseated, and the feeling does not go away.  You notice new bruising at the needle insertion site.  You feel weaker than normal.  You have a fever or chills. Get help right away if:  You have severe nausea or vomiting.  You have chest pain.  You have trouble breathing. This information is not intended to replace advice given to you by your health care provider. Make sure you discuss any questions you have with your health care provider. Document Released: 02/10/2011 Document Revised: 05/10/2016 Document Reviewed: 09/04/2014 Elsevier Interactive Patient Education  2017 Elsevier Inc.  

## 2016-11-12 NOTE — Telephone Encounter (Signed)
Sent to the pharmacy by e-scribe for 9 months.  Pt due back in 08/2017

## 2016-12-01 DIAGNOSIS — H04123 Dry eye syndrome of bilateral lacrimal glands: Secondary | ICD-10-CM | POA: Diagnosis not present

## 2016-12-01 DIAGNOSIS — H2513 Age-related nuclear cataract, bilateral: Secondary | ICD-10-CM | POA: Diagnosis not present

## 2016-12-01 DIAGNOSIS — H5203 Hypermetropia, bilateral: Secondary | ICD-10-CM | POA: Diagnosis not present

## 2016-12-20 DIAGNOSIS — M19041 Primary osteoarthritis, right hand: Secondary | ICD-10-CM | POA: Insufficient documentation

## 2016-12-20 DIAGNOSIS — M19072 Primary osteoarthritis, left ankle and foot: Secondary | ICD-10-CM

## 2016-12-20 DIAGNOSIS — M19042 Primary osteoarthritis, left hand: Secondary | ICD-10-CM | POA: Insufficient documentation

## 2016-12-20 DIAGNOSIS — M19071 Primary osteoarthritis, right ankle and foot: Secondary | ICD-10-CM | POA: Insufficient documentation

## 2016-12-20 DIAGNOSIS — M170 Bilateral primary osteoarthritis of knee: Secondary | ICD-10-CM | POA: Insufficient documentation

## 2016-12-23 ENCOUNTER — Ambulatory Visit (INDEPENDENT_AMBULATORY_CARE_PROVIDER_SITE_OTHER): Payer: Medicare Other | Admitting: Rheumatology

## 2016-12-23 ENCOUNTER — Encounter: Payer: Self-pay | Admitting: Rheumatology

## 2016-12-23 DIAGNOSIS — M19072 Primary osteoarthritis, left ankle and foot: Secondary | ICD-10-CM

## 2016-12-23 DIAGNOSIS — M19042 Primary osteoarthritis, left hand: Secondary | ICD-10-CM | POA: Diagnosis not present

## 2016-12-23 DIAGNOSIS — M19041 Primary osteoarthritis, right hand: Secondary | ICD-10-CM

## 2016-12-23 DIAGNOSIS — M19071 Primary osteoarthritis, right ankle and foot: Secondary | ICD-10-CM

## 2016-12-23 DIAGNOSIS — M8589 Other specified disorders of bone density and structure, multiple sites: Secondary | ICD-10-CM | POA: Diagnosis not present

## 2016-12-23 DIAGNOSIS — M160 Bilateral primary osteoarthritis of hip: Secondary | ICD-10-CM | POA: Diagnosis not present

## 2016-12-23 DIAGNOSIS — E785 Hyperlipidemia, unspecified: Secondary | ICD-10-CM | POA: Diagnosis not present

## 2016-12-23 DIAGNOSIS — M47816 Spondylosis without myelopathy or radiculopathy, lumbar region: Secondary | ICD-10-CM

## 2016-12-23 DIAGNOSIS — E039 Hypothyroidism, unspecified: Secondary | ICD-10-CM

## 2016-12-23 DIAGNOSIS — M170 Bilateral primary osteoarthritis of knee: Secondary | ICD-10-CM | POA: Diagnosis not present

## 2016-12-23 NOTE — Patient Instructions (Signed)
  Call us if you need voltaren gel  =======================   Supplements for OA Natural anti-inflammatories  You can purchase these at [facility], [employer] or online.  . Turmeric (capsules)  . Ginger (ginger root or capsules)  . Omega 3 (Fish, flax seeds, chia seeds, walnuts, almonds)  . Tart cherry (dried or extract)   Patient should be under the care of a physician while taking these supplements. This may not be reproduced without the permission of Dr. Bo Merino.

## 2016-12-23 NOTE — Progress Notes (Addendum)
Office Visit Note  Patient: Angela Burgess             Date of Birth: 1941/08/29           MRN: [id number]             PCP: Lottie Dawson, MD Referring: Burnis Medin, MD Visit Date: 12/23/2016 Occupation: _0 @    Subjective:  Follow-up   History of Present Illness: Angela Burgess is a 76 y.o. female  Last seen 12/24/2015 and was requested to come back on annual basis and she is doing so well.  She carries a diagnosis of hemochromatosis. She is doing well with hemochromatosis and is not having any joint pain stiffness and swelling. She also has low back pain for which she has physical therapy and acupuncture done. This is helpful for her.  She also has history of OA of the hands feet and hip joint. She does have some knee joint discomfort from time to time but doing well. Currently her knees are not doing better enough to require any Visco supplementation but she does have that in the back of her mind and she plans to move forward with that perhaps in 2019 if needed.  She has seen the physical therapist at [facility] and is getting good care from [facility]. The dry needling is helping.  She also gets acupuncture from Dr. Rene Kocher and that is going well also.    Activities of Daily Living:  Patient reports morning stiffness for 15 minutes.   Patient Denies nocturnal pain.  Difficulty dressing/grooming: Denies Difficulty climbing stairs: Denies Difficulty getting out of chair: Denies Difficulty using hands for taps, buttons, cutlery, and/or writing: Denies   Review of Systems  Constitutional: Negative for fatigue.  HENT: Negative for mouth sores and mouth dryness.   Eyes: Negative for dryness.  Respiratory: Negative for shortness of breath.   Gastrointestinal: Negative for constipation and diarrhea.  Musculoskeletal: Negative for myalgias and myalgias.  Skin: Negative for sensitivity to sunlight.  Psychiatric/Behavioral: Negative for decreased concentration and sleep  disturbance.    PMFS History:  Patient Active Problem List   Diagnosis Date Noted  . Primary osteoarthritis of both hands 12/20/2016  . Primary osteoarthritis of both knees 12/20/2016  . Primary osteoarthritis of both feet 12/20/2016  . Hypothyroidism 09/05/2014  . Hyperlipidemia 09/05/2014  . Medicare annual wellness visit, subsequent 09/05/2014  . Lumbar spondylosis 07/07/2014  . 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 Visit for preventive health examination 08/31/2013  . Hair loss 01/20/2013  . Elevated MCV 07/22/2012  . Preventative health care 04/02/2011  . IRRITABLE BOWEL SYNDROME 07/10/2010  . GASTRITIS 07/04/2010  . RHINITIS 05/21/2010  . VERTIGO 05/21/2010  . VARICOSE VEINS, LOWER EXTREMITIES 11/13/2009  . OTHER DISEASES OF NASAL CAVITY AND SINUSES 01/31/2008  . HYPOTHYROIDISM 08/31/2007  . HYPERLIPIDEMIA 08/31/2007  . OTHER SPEC DISEASES BLOOD&BLOOD-FORMING ORGANS 08/31/2007  . ROSACEA 08/31/2007  . Osteoarthritis of right hip 08/31/2007  . OSTEOPENIA 08/31/2007    Past Medical History:  Diagnosis Date  . Anemia   . Arthritis   . Cervical cancer ([place]) 1968  . Dexamethasone adverse reaction 2008  . Elevated MCV    for years  . Eye exam abnormal 6/09  . Family history of malignant neoplasm of gastrointestinal tract   . Gastritis   . History of mammogram 1/10  . History of normal resting EKG 2005  . Hyperlipidemia   . Hypothyroidism   . IBS (irritable bowel syndrome)   . Macrocytosis  with theme eval in past and normal b12  . Osteopenia   . Rectocele   . Rosacea   . Tetanus-diphtheria (Td) vaccination 1999    Family History  Problem Relation Age of Onset  . Cirrhosis Mother   . Kidney disease Mother   . Allergy (severe) Mother     protein allergy that sent posion to her brain  . Leukemia Father   . Heart attack Father 77  . Diabetes Father   . Bipolar disorder Sister   . Stroke Sister   . Lung cancer Brother     smoker  . Seizures Brother   . Bipolar  disorder Son   . Colon cancer Brother   . Seizures Sister   . Seizures Sister    Past Surgical History:  Procedure Laterality Date  . ABDOMINAL HYSTERECTOMY    . APPENDECTOMY    . 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 She appears well-developed and well-nourished.  HENT:  Head: Normocephalic and atraumatic.  Eyes: EOM are normal. Pupils are equal, round, and reactive to light.  Cardiovascular: Normal rate, regular rhythm and normal heart sounds.  Exam reveals no gallop and no friction rub.   No murmur heard. Pulmonary/Chest: Effort normal and breath sounds normal. She has no wheezes. She has no rales.  Abdominal: Soft. Bowel sounds are normal. She exhibits no distension. There is no tenderness. There is no guarding. No hernia.  Musculoskeletal: Normal range of motion. She exhibits no edema, tenderness or deformity.  Lymphadenopathy:    She has no cervical adenopathy.  Neurological: She is alert and oriented to person, place, and time. Coordination normal.  Skin: Skin is warm and dry. Capillary refill takes less than 2 seconds. No rash noted.  Psychiatric: She has a normal mood and affect. Her behavior is normal.  Nursing note and vitals reviewed.    Musculoskeletal Exam:  Full range of motion of all joints Grip strength is equal and strong bilaterally Fiber myalgia tender points are absent  CDAI Exam: CDAI Homunculus Exam:   Joint Counts:  CDAI Tender Joint count: 0 CDAI Swollen Joint count: 0  Global Assessments:  Patient Global Assessment: 1 Provider Global Assessment: 1  CDAI Calculated Score: 2  Mild discomfort from osteoarthritis and low back  pain.  No synovitis on examination  Investigation: No additional findings.  Appointment on 11/03/2016  Component Date Value Ref Range Status  . WBC 11/03/2016 3.7* 3.9 - 10.0 10e3/uL Final  . RBC 11/03/2016 3.46* 3.70 - 5.32 10e6/uL Final  . HGB 11/03/2016 12.5  11.6 - 15.9 g/dL Final  . HCT 11/03/2016 35.2  34.8 - 46.6 % Final  . MCV 11/03/2016 102* 81 - 101 fL Final  . MCH 11/03/2016 36.1* 26.0 - 34.0 pg Final  . MCHC 11/03/2016 35.5  32.0 - 36.0 g/dL Final  . RDW 11/03/2016 11.3  11.1 - 15.7 % Final  . Platelets 11/03/2016 206  145 - 400 10e3/uL Final  . NEUT# 11/03/2016 1.9  1.5 - 6.5 10e3/uL Final  .  LYMPH# 11/03/2016 1.3  0.9 - 3.3 10e3/uL Final  . MONO# 11/03/2016 0.3  0.1 - 0.9 10e3/uL Final  . Eosinophils Absolute 11/03/2016 0.1  0.0 - 0.5 10e3/uL Final  . BASO# 11/03/2016 0.0  0.0 - 0.2 10e3/uL Final  . NEUT% 11/03/2016 51.6  39.6 - 80.0 % Final  . LYMPH% 11/03/2016 36.0  14.0 - 48.0 % Final  . MONO% 11/03/2016 9.1  0.0 - 13.0 % Final  . EOS% 11/03/2016 2.2  0.0 - 7.0 % Final  . BASO% 11/03/2016 1.1  0.0 - 2.0 % Final  . Ferritin 11/03/2016 16  9 - 269 ng/ml Final  . Iron 11/03/2016 159* 41 - 142 ug/dL Final  . TIBC 11/03/2016 228* 236 - 444 ug/dL Final  . UIBC 11/03/2016 68* 120 - 384 ug/dL Final  . %SAT 11/03/2016 70* 21 - 57 % Final  . Sodium 11/03/2016 140  136 - 145 mEq/L Final  . Potassium 11/03/2016 4.3  3.5 - 5.1 mEq/L Final  . Chloride 11/03/2016 105  98 - 109 mEq/L Final  . CO2 11/03/2016 28  22 - 29 mEq/L Final  . Glucose 11/03/2016 88  70 - 140 mg/dl Final  . BUN 11/03/2016 15.7  7.0 - 26.0 mg/dL Final  . Creatinine 11/03/2016 0.8  0.6 - 1.1 mg/dL Final  . Total Bilirubin 11/03/2016 0.49  0.20 - 1.20 mg/dL Final  . Alkaline Phosphatase 11/03/2016 59  40 - 150 U/L Final  . AST 11/03/2016 17  5 - 34 U/L Final  . ALT 11/03/2016 10  0 - 55 U/L Final  . Total Protein 11/03/2016 6.7  6.4 - 8.3 g/dL Final  . Albumin 11/03/2016 3.9  3.5 - 5.0 g/dL Final  .  Calcium 11/03/2016 9.6  8.4 - 10.4 mg/dL Final  . Anion Gap 11/03/2016 6  3 - 11 mEq/L Final  . EGFR 11/03/2016 76* >90 ml/min/1.73 m2 Final  Lab on 09/08/2016  Component Date Value Ref Range Status  . Sodium 09/08/2016 139  135 - 145 mEq/L Final  . Potassium 09/08/2016 4.1  3.5 - 5.1 mEq/L Final  . Chloride 09/08/2016 102  96 - 112 mEq/L Final  . CO2 09/08/2016 29  19 - 32 mEq/L Final  . Glucose, Bld 09/08/2016 93  70 - 99 mg/dL Final  . BUN 09/08/2016 13  6 - 23 mg/dL Final  . Creatinine, Ser 09/08/2016 0.71  0.40 - 1.20 mg/dL Final  . Calcium 09/08/2016 9.3  8.4 - 10.5 mg/dL Final  . GFR 09/08/2016 85.04  >60.00 mL/min Final  . WBC 09/08/2016 3.8* 4.0 - 10.5 K/uL Final  . RBC 09/08/2016 3.58* 3.87 - 5.11 Mil/uL Final  . Hemoglobin 09/08/2016 12.3  12.0 - 15.0 g/dL Final  . HCT 09/08/2016 35.0* 36.0 - 46.0 % Final  . MCV 09/08/2016 97.8  78.0 - 100.0 fl Final  . MCHC 09/08/2016 35.2  30.0 - 36.0 g/dL Final  . RDW 09/08/2016 14.6  11.5 - 15.5 % Final  . Platelets 09/08/2016 225.0  150.0 - 400.0 K/uL Final  . Neutrophils Relative % 09/08/2016 59.7  43.0 - 77.0 % Final  . Lymphocytes Relative 09/08/2016 31.3  12.0 - 46.0 % Final  . Monocytes Relative 09/08/2016 7.0  3.0 - 12.0 % Final  . Eosinophils Relative 09/08/2016 1.2  0.0 - 5.0 % Final  . Basophils Relative 09/08/2016 0.8  0.0 - 3.0 % Final  . Neutro Abs 09/08/2016 2.3  1.4 - 7.7 K/uL Final  . Lymphs Abs  09/08/2016 1.2  0.7 - 4.0 K/uL Final  . Monocytes Absolute 09/08/2016 0.3  0.1 - 1.0 K/uL Final  . Eosinophils Absolute 09/08/2016 0.0  0.0 - 0.7 K/uL Final  . Basophils Absolute 09/08/2016 0.0  0.0 - 0.1 K/uL Final  . Total Bilirubin 09/08/2016 0.5  0.2 - 1.2 mg/dL Final  . Bilirubin, Direct 09/08/2016 0.1  0.0 - 0.3 mg/dL Final  . Alkaline Phosphatase 09/08/2016 53  39 - 117 U/L Final  . AST 09/08/2016 15  0 - 37 U/L Final  . ALT 09/08/2016 10  0 - 35 U/L Final  . Total Protein 09/08/2016 6.4  6.0 - 8.3 g/dL Final  .  Albumin 09/08/2016 4.2  3.5 - 5.2 g/dL Final  . Cholesterol 09/08/2016 246* 0 - 200 mg/dL Final  . Triglycerides 09/08/2016 116.0  0.0 - 149.0 mg/dL Final  . HDL 09/08/2016 63.30  >39.00 mg/dL Final  . VLDL 09/08/2016 23.2  0.0 - 40.0 mg/dL Final  . LDL Cholesterol 09/08/2016 159* 0 - 99 mg/dL Final  . Total CHOL/HDL Ratio 09/08/2016 4   Final  . NonHDL 09/08/2016 182.60   Final  . TSH 09/08/2016 1.90  0.35 - 4.50 uIU/mL Final  Appointment on 07/21/2016  Component Date Value Ref Range Status  . WBC 07/21/2016 3.3* 3.9 - 10.0 10e3/uL Final  . RBC 07/21/2016 3.53* 3.70 - 5.32 10e6/uL Final  . HGB 07/21/2016 11.8  11.6 - 15.9 g/dL Final  . HCT 07/21/2016 34.4* 34.8 - 46.6 % Final  . MCV 07/21/2016 98  81 - 101 fL Final  . MCH 07/21/2016 33.4  26.0 - 34.0 pg Final  . MCHC 07/21/2016 34.3  32.0 - 36.0 g/dL Final  . RDW 07/21/2016 12.4  11.1 - 15.7 % Final  . Platelets 07/21/2016 211  145 - 400 10e3/uL Final  . NEUT# 07/21/2016 1.9  1.5 - 6.5 10e3/uL Final  . LYMPH# 07/21/2016 1.1  0.9 - 3.3 10e3/uL Final  . MONO# 07/21/2016 0.2  0.1 - 0.9 10e3/uL Final  . Eosinophils Absolute 07/21/2016 0.1  0.0 - 0.5 10e3/uL Final  . BASO# 07/21/2016 0.0  0.0 - 0.2 10e3/uL Final  . NEUT% 07/21/2016 57.1  39.6 - 80.0 % Final  . LYMPH% 07/21/2016 33.6  14.0 - 48.0 % Final  . MONO% 07/21/2016 7.2  0.0 - 13.0 % Final  . EOS% 07/21/2016 1.5  0.0 - 7.0 % Final  . BASO% 07/21/2016 0.6  0.0 - 2.0 % Final  . Sodium 07/21/2016 138  128 - 145 mEq/L Final  . Potassium 07/21/2016 3.6  3.3 - 4.7 mEq/L Final  . Chloride 07/21/2016 98  98 - 108 mEq/L Final  . CO2 07/21/2016 28  18 - 33 mEq/L Final  . Glucose, Bld 07/21/2016 109  73 - 118 mg/dL Final  . BUN, Bld 07/21/2016 11  7 - 22 mg/dL Final  . Creat 07/21/2016 0.7  0.6 - 1.2 mg/dl Final  . Total Bilirubin 07/21/2016 0.60  0.20 - 1.60 mg/dl Final  . Alkaline Phosphatase 07/21/2016 59  26 - 84 U/L Final  . AST 07/21/2016 21  11 - 38 U/L Final  . ALT(SGPT)  07/21/2016 19  10 - 47 U/L Final  . Total Protein 07/21/2016 6.6  6.4 - 8.1 g/dL Final  . Albumin 07/21/2016 3.5  3.3 - 5.5 g/dL Final  . Calcium 07/21/2016 8.9  8.0 - 10.3 mg/dL Final  . Ferritin 07/21/2016 11  9 - 269 ng/ml Final  . Iron 07/21/2016  90  41 - 142 ug/dL Final  . TIBC 07/21/2016 245  236 - 444 ug/dL Final  . UIBC 07/21/2016 155  120 - 384 ug/dL Final  . %SAT 07/21/2016 37  21 - 57 % Final     Imaging: No results found.  Speciality Comments: No specialty comments available.    Procedures:  No procedures performed Allergies: Tetracycline   Assessment / Plan:     Visit Diagnoses: Hereditary hemochromatosis ([place]) - Followed up by Dr. Marin Olp  Primary osteoarthritis of both hands  Primary osteoarthritis of both hips  Primary osteoarthritis of both knees  Primary osteoarthritis of both feet  DDD lumbar spine  Osteopenia of multiple sites - Followed up by GYN  Hypothyroidism, unspecified type  Hyperlipidemia   Plan: #1: Hemochromatosis. Doing well. Seeing Dr. Marin Olp on a regular basis  #2: OA of the hands, feet, hip joint. Ongoing discomfort.  #3: Some knee joint pain. Mild discomfort but does not require Visco supplementation. Patient is aware of this and will let us know if she wants to move forward with Visco in the future. She inks that maybe she might need it in 2019.  #4: No med refills needed at this time  #5: Did really well with physical therapy. Needs a new prescription for physical therapy. I wrote a prescription for the following: Evaluate and treat, low back pain. History of spondylolisthesis, protruding disc. Also having hand pain and knee joint pain. I gave the prescription to the patient and I'll be happy to rewrite another one in case she needs it in the future.  #6: Return to clinic in 1 year. Sooner if there is any issues, questions or concerns  Orders: No orders of the defined types were placed in this encounter.  No orders  of the defined types were placed in this encounter.   Face-to-face time spent with patient was 30 minutes. 50% of time was spent in counseling and coordination of care.  Follow-Up Instructions: Return in about 1 year (around 12/23/2017) for Hemochromatosis,LBP, KJ discomfort, oa hands,feet.   Bo Merino, MD  On my exam today patient had no synovitis. She continues to have some discomfort in her wrist joint in her right second MCP joint. We discussed natural anti-inflammatories. Joint protection and muscle strengthening was also discussed. I examined and evaluated the patient with Eliezer Lofts PA. The plan of care was discussed as noted above.  Bo Merino, MD Note - This record has been created using Editor, commissioning.  Chart creation errors have been sought, but may not always  have been located. Such creation errors do not reflect on  the standard of medical care.

## 2016-12-25 ENCOUNTER — Telehealth (INDEPENDENT_AMBULATORY_CARE_PROVIDER_SITE_OTHER): Payer: Self-pay

## 2016-12-25 NOTE — Telephone Encounter (Signed)
Patient calling stating she does actually want the Voltaren Gel after all [facility]

## 2016-12-25 NOTE — Telephone Encounter (Signed)
Patient in office on 12/23/16 and has decided she would like the prescription for Voltaren Gel

## 2016-12-26 MED ORDER — DICLOFENAC SODIUM 1 % TD GEL: TRANSDERMAL | 3 refills | Status: AC

## 2016-12-26 NOTE — Telephone Encounter (Signed)
Okay to do Voltaren gel3 g to 3 large joints up to 3 times a dayDispensed 3 tubes with 3 refills(Patient aware on how to take the medication).

## 2016-12-26 NOTE — Telephone Encounter (Signed)
Patient advised prescription has been sent to the pharmacy.  

## 2016-12-26 NOTE — Telephone Encounter (Signed)
Received a prior authorization request for patient's diclofenac gel from [facility]. An authorization was submitted through cover my meds. It has been approved by the patient's insurance through 09/21/2017. Patient was notified and voiced understanding.   Reference number: [id number] Phone: [phone]  Demetrios Loll, CPhT 4:18 PM

## 2016-12-26 NOTE — Addendum Note (Signed)
Addended by: Carole Binning on: 12/26/2016 03:08 PM   Modules accepted: Orders

## 2017-01-26 ENCOUNTER — Other Ambulatory Visit: Payer: Medicare Other

## 2017-01-26 ENCOUNTER — Other Ambulatory Visit: Payer: Self-pay | Admitting: *Deleted

## 2017-01-26 DIAGNOSIS — H52203 Unspecified astigmatism, bilateral: Secondary | ICD-10-CM | POA: Diagnosis not present

## 2017-01-26 DIAGNOSIS — H43822 Vitreomacular adhesion, left eye: Secondary | ICD-10-CM | POA: Diagnosis not present

## 2017-01-26 DIAGNOSIS — H1851 Endothelial corneal dystrophy: Secondary | ICD-10-CM | POA: Diagnosis not present

## 2017-01-26 DIAGNOSIS — H25813 Combined forms of age-related cataract, bilateral: Secondary | ICD-10-CM | POA: Diagnosis not present

## 2017-01-26 DIAGNOSIS — H527 Unspecified disorder of refraction: Secondary | ICD-10-CM | POA: Diagnosis not present

## 2017-01-27 ENCOUNTER — Other Ambulatory Visit (HOSPITAL_BASED_OUTPATIENT_CLINIC_OR_DEPARTMENT_OTHER): Payer: Medicare Other

## 2017-01-27 LAB — COMPREHENSIVE METABOLIC PANEL
ALBUMIN: 3.9 g/dL (ref 3.5–5.0)
ALK PHOS: 58 U/L (ref 40–150)
ALT: 13 U/L (ref 0–55)
AST: 18 U/L (ref 5–34)
Anion Gap: 7 mEq/L (ref 3–11)
BUN: 14.6 mg/dL (ref 7.0–26.0)
CO2: 29 meq/L (ref 22–29)
Calcium: 9.7 mg/dL (ref 8.4–10.4)
Chloride: 102 mEq/L (ref 98–109)
Creatinine: 0.8 mg/dL (ref 0.6–1.1)
EGFR: 70 mL/min/{1.73_m2} — ABNORMAL LOW (ref >90)
Glucose: 98 mg/dl (ref 70–140)
POTASSIUM: 4.6 meq/L (ref 3.5–5.1)
SODIUM: 138 meq/L (ref 136–145)
Total Bilirubin: 0.42 mg/dL (ref 0.20–1.20)
Total Protein: 6.7 g/dL (ref 6.4–8.3)

## 2017-01-27 LAB — FERRITIN: Ferritin: 12 ng/ml (ref 9–269)

## 2017-01-27 LAB — CBC WITH DIFFERENTIAL (CANCER CENTER ONLY)
BASO#: 0 10*3/uL (ref 0.0–0.2)
BASO%: 0.5 % (ref 0.0–2.0)
EOS%: 1 % (ref 0.0–7.0)
Eosinophils Absolute: 0 10*3/uL (ref 0.0–0.5)
HEMATOCRIT: 33.6 % — AB (ref 34.8–46.6)
HEMOGLOBIN: 11.7 g/dL (ref 11.6–15.9)
LYMPH#: 1.1 10*3/uL (ref 0.9–3.3)
LYMPH%: 28.6 % (ref 14.0–48.0)
MCH: 36.1 pg — ABNORMAL HIGH (ref 26.0–34.0)
MCHC: 34.8 g/dL (ref 32.0–36.0)
MCV: 104 fL — AB (ref 81–101)
MONO#: 0.3 10*3/uL (ref 0.1–0.9)
MONO%: 8.3 % (ref 0.0–13.0)
NEUT#: 2.4 10*3/uL (ref 1.5–6.5)
NEUT%: 61.6 % (ref 39.6–80.0)
Platelets: 211 10*3/uL (ref 145–400)
RBC: 3.24 10*6/uL — ABNORMAL LOW (ref 3.70–5.32)
RDW: 11.1 % (ref 11.1–15.7)
WBC: 3.8 10*3/uL — ABNORMAL LOW (ref 3.9–10.0)

## 2017-01-27 LAB — IRON AND TIBC
%SAT: 55 % (ref 21–57)
Iron: 129 ug/dL (ref 41–142)
TIBC: 235 ug/dL — AB (ref 236–444)
UIBC: 106 ug/dL — ABNORMAL LOW (ref 120–384)

## 2017-01-28 ENCOUNTER — Encounter: Payer: Self-pay | Admitting: *Deleted

## 2017-02-02 ENCOUNTER — Ambulatory Visit (HOSPITAL_BASED_OUTPATIENT_CLINIC_OR_DEPARTMENT_OTHER): Payer: Medicare Other | Admitting: Hematology & Oncology

## 2017-02-02 NOTE — Progress Notes (Signed)
Hematology and Oncology Follow Up Visit  Angela Burgess [id number] 1941/06/06 76 y.o. 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  Burgess is back for followup.  She is doing quite well. She is having no problems with the hemachromatosis. We are being very aggressive with her phlebotomy program.  She will have cataract surgery. She'll have this next week, and the week after. I told her that I don't see a problem with her having cataract surgery and with her having hemochromatosis, this should not affect any healing.   Her husband is doing okay. Angela Burgess has prostate/bladder cancer. Angela Burgess is being treated at [facility].   She did have a very nice Mother's Day weekend.   Angela Burgess does sound like they will be going on there [place] cruise this summer. 2 much sees be going on with their health.    Overall, her performance status is ECOG 0.   Medications:  Current Outpatient Prescriptions:  .  aspirin 81 MG tablet, Take 81 mg by mouth daily., Disp: , Rfl:  .  Astaxanthin 4 MG CAPS, Take 1 capsule by mouth 2 (two) times daily., Disp: , Rfl:  .  Calcium Carbonate-Vitamin D (CALCIUM 500/D) 500-125 MG-UNIT TABS, Take by mouth.  , Disp: , Rfl:  .  Cholecalciferol (VITAMIN D3) 2000 UNITS TABS, Take by mouth every morning., Disp: , Rfl:  .  diclofenac sodium (VOLTAREN) 1 % GEL, 3 grams to 3 large joints up to three times a day, Disp: 3 Tube, Rfl: 3 .  estradiol (ESTRACE) 0.1 MG/GM vaginal cream, Place 1 Applicatorful vaginally 2 (two) times a week. , Disp: , Rfl:  .  fish oil-omega-3 fatty acids 1000 MG capsule, Take 2 g by mouth daily.  , Disp: , Rfl:  .  levothyroxine (SYNTHROID, LEVOTHROID) 112 MCG tablet, TAKE 1 TABLET BY MOUTH EVERY DAY, Disp: 90 tablet, Rfl: 2 .  metroNIDAZOLE (METROCREAM) 0.75 % cream, Apply 1 application topically daily.  ,  Disp: , Rfl:  .  Multiple Vitamin (MULTIVITAMIN) tablet, Take 1 tablet by mouth daily.  , Disp: , Rfl:   Allergies:  Allergies  Allergen Reactions  . Tetracycline Rash    Past Medical History, Surgical history, Social history, and Family History were reviewed and updated.  Review of Systems: As above  Physical Exam:  weight is 127 lb 0.6 oz (57.6 kg). Her oral temperature is 98 F (36.7 C). Her blood pressure is 131/56 (abnormal) and her pulse is 57 (abnormal). Her respiration is 16 and oxygen saturation is 100%.   Well-developed and well-nourished white female. Head and exam shows no ocular or oral lesions. She is no palpable cervical or supraclavicular lymph nodes. Lungs are clear. Cardiac exam regular rate and rhythm with no murmurs, rubs or bruits.. Abdomen is soft. She has good bowel sounds. There is no fluid wave. There is no palpable liver or spleen. Back exam shows no tenderness over the spine, ribs or hips. Extremities shows no clubbing cyanosis or edema. Skin exam shows no rashes, ecchymoses or petechia. Neurological exam with no focal deficits.  Lab Results  Component Value Date   WBC 3.8 (L) 01/27/2017   HGB 11.7 01/27/2017   HCT 33.6 (L) 01/27/2017   MCV 104 (H) 01/27/2017   PLT 211 01/27/2017     Chemistry  Component Value Date/Time   NA 138 01/27/2017 1122   K 4.6 01/27/2017 1122   CL 102 09/08/2016 0916   CL 98 07/21/2016 0847   CO2 29 01/27/2017 1122   BUN 14.6 01/27/2017 1122   CREATININE 0.8 01/27/2017 1122      Component Value Date/Time   CALCIUM 9.7 01/27/2017 1122   ALKPHOS 58 01/27/2017 1122   AST 18 01/27/2017 1122   ALT 13 01/27/2017 1122   BILITOT 0.42 01/27/2017 1122         Impression and Plan: [name] is 76 year old female with hemachromatosis. She is homozygous for the major  C282Y mutation.  Given that her iron saturation is 55%, we will go ahead and phlebotomize her. I want to keep her iron saturation less than 50%.  We  will then plan to get her back to see [place] in 3 months. As always, we will get labs a week before we see her.  [name], MD 5/14/201810:26 AM

## 2017-02-04 ENCOUNTER — Ambulatory Visit (HOSPITAL_BASED_OUTPATIENT_CLINIC_OR_DEPARTMENT_OTHER): Payer: Medicare Other

## 2017-02-04 MED ORDER — SODIUM CHLORIDE 0.9 % IV SOLN
Freq: Once | INTRAVENOUS | Status: AC
  Administered 2017-02-04: 10:00:00 via INTRAVENOUS

## 2017-02-05 DIAGNOSIS — J309 Allergic rhinitis, unspecified: Secondary | ICD-10-CM | POA: Diagnosis not present

## 2017-02-05 DIAGNOSIS — H6123 Impacted cerumen, bilateral: Secondary | ICD-10-CM | POA: Diagnosis not present

## 2017-02-05 DIAGNOSIS — H25813 Combined forms of age-related cataract, bilateral: Secondary | ICD-10-CM | POA: Diagnosis not present

## 2017-02-10 DIAGNOSIS — H25811 Combined forms of age-related cataract, right eye: Secondary | ICD-10-CM | POA: Diagnosis not present

## 2017-02-10 DIAGNOSIS — E039 Hypothyroidism, unspecified: Secondary | ICD-10-CM | POA: Diagnosis not present

## 2017-02-10 DIAGNOSIS — H25011 Cortical age-related cataract, right eye: Secondary | ICD-10-CM | POA: Diagnosis not present

## 2017-02-10 DIAGNOSIS — Z79899 Other long term (current) drug therapy: Secondary | ICD-10-CM | POA: Diagnosis not present

## 2017-02-11 ENCOUNTER — Encounter: Payer: Self-pay | Admitting: Internal Medicine

## 2017-02-11 DIAGNOSIS — H52203 Unspecified astigmatism, bilateral: Secondary | ICD-10-CM | POA: Diagnosis not present

## 2017-02-11 DIAGNOSIS — H2511 Age-related nuclear cataract, right eye: Secondary | ICD-10-CM | POA: Diagnosis not present

## 2017-02-11 DIAGNOSIS — H1851 Endothelial corneal dystrophy: Secondary | ICD-10-CM | POA: Diagnosis not present

## 2017-02-11 DIAGNOSIS — H25812 Combined forms of age-related cataract, left eye: Secondary | ICD-10-CM | POA: Diagnosis not present

## 2017-02-11 DIAGNOSIS — Z961 Presence of intraocular lens: Secondary | ICD-10-CM | POA: Diagnosis not present

## 2017-03-03 DIAGNOSIS — Z881 Allergy status to other antibiotic agents status: Secondary | ICD-10-CM | POA: Diagnosis not present

## 2017-03-03 DIAGNOSIS — H527 Unspecified disorder of refraction: Secondary | ICD-10-CM | POA: Diagnosis not present

## 2017-03-03 DIAGNOSIS — Z79899 Other long term (current) drug therapy: Secondary | ICD-10-CM | POA: Diagnosis not present

## 2017-03-03 DIAGNOSIS — Z7982 Long term (current) use of aspirin: Secondary | ICD-10-CM | POA: Diagnosis not present

## 2017-03-03 DIAGNOSIS — H43822 Vitreomacular adhesion, left eye: Secondary | ICD-10-CM | POA: Diagnosis not present

## 2017-03-03 DIAGNOSIS — Z961 Presence of intraocular lens: Secondary | ICD-10-CM | POA: Diagnosis not present

## 2017-03-03 DIAGNOSIS — H1851 Endothelial corneal dystrophy: Secondary | ICD-10-CM | POA: Diagnosis not present

## 2017-03-03 DIAGNOSIS — H25812 Combined forms of age-related cataract, left eye: Secondary | ICD-10-CM | POA: Diagnosis not present

## 2017-03-03 DIAGNOSIS — Z9841 Cataract extraction status, right eye: Secondary | ICD-10-CM | POA: Diagnosis not present

## 2017-03-03 DIAGNOSIS — M1990 Unspecified osteoarthritis, unspecified site: Secondary | ICD-10-CM | POA: Diagnosis not present

## 2017-03-03 DIAGNOSIS — H52203 Unspecified astigmatism, bilateral: Secondary | ICD-10-CM | POA: Diagnosis not present

## 2017-03-03 DIAGNOSIS — E039 Hypothyroidism, unspecified: Secondary | ICD-10-CM | POA: Diagnosis not present

## 2017-03-03 DIAGNOSIS — H43813 Vitreous degeneration, bilateral: Secondary | ICD-10-CM | POA: Diagnosis not present

## 2017-03-04 DIAGNOSIS — H52203 Unspecified astigmatism, bilateral: Secondary | ICD-10-CM | POA: Diagnosis not present

## 2017-03-04 DIAGNOSIS — Z961 Presence of intraocular lens: Secondary | ICD-10-CM | POA: Diagnosis not present

## 2017-03-04 DIAGNOSIS — H2513 Age-related nuclear cataract, bilateral: Secondary | ICD-10-CM | POA: Diagnosis not present

## 2017-03-04 DIAGNOSIS — H1851 Endothelial corneal dystrophy: Secondary | ICD-10-CM | POA: Diagnosis not present

## 2017-03-04 DIAGNOSIS — H43822 Vitreomacular adhesion, left eye: Secondary | ICD-10-CM | POA: Diagnosis not present

## 2017-03-20 DIAGNOSIS — Z01419 Encounter for gynecological examination (general) (routine) without abnormal findings: Secondary | ICD-10-CM | POA: Diagnosis not present

## 2017-03-20 DIAGNOSIS — Z6821 Body mass index (BMI) 21.0-21.9, adult: Secondary | ICD-10-CM | POA: Diagnosis not present

## 2017-03-20 DIAGNOSIS — Z1231 Encounter for screening mammogram for malignant neoplasm of breast: Secondary | ICD-10-CM | POA: Diagnosis not present

## 2017-05-11 ENCOUNTER — Other Ambulatory Visit (HOSPITAL_BASED_OUTPATIENT_CLINIC_OR_DEPARTMENT_OTHER): Payer: Medicare Other

## 2017-05-11 LAB — CMP (CANCER CENTER ONLY)
ALT(SGPT): 14 U/L (ref 10–47)
AST: 23 U/L (ref 11–38)
Albumin: 3.7 g/dL (ref 3.3–5.5)
Alkaline Phosphatase: 55 U/L (ref 26–84)
BILIRUBIN TOTAL: 0.8 mg/dL (ref 0.20–1.60)
BUN, Bld: 15 mg/dL (ref 7–22)
CO2: 33 mEq/L (ref 18–33)
CREATININE: 0.9 mg/dL (ref 0.6–1.2)
Calcium: 9.2 mg/dL (ref 8.0–10.3)
Chloride: 102 mEq/L (ref 98–108)
GLUCOSE: 102 mg/dL (ref 73–118)
Potassium: 4.2 mEq/L (ref 3.3–4.7)
SODIUM: 140 meq/L (ref 128–145)
Total Protein: 7.1 g/dL (ref 6.4–8.1)

## 2017-05-11 LAB — CBC WITH DIFFERENTIAL (CANCER CENTER ONLY)
BASO#: 0 10*3/uL (ref 0.0–0.2)
BASO%: 0.8 % (ref 0.0–2.0)
EOS%: 1.6 % (ref 0.0–7.0)
Eosinophils Absolute: 0.1 10*3/uL (ref 0.0–0.5)
HCT: 34.4 % — ABNORMAL LOW (ref 34.8–46.6)
HEMOGLOBIN: 11.9 g/dL (ref 11.6–15.9)
LYMPH#: 1.2 10*3/uL (ref 0.9–3.3)
LYMPH%: 32 % (ref 14.0–48.0)
MCH: 34.6 pg — ABNORMAL HIGH (ref 26.0–34.0)
MCHC: 34.6 g/dL (ref 32.0–36.0)
MCV: 100 fL (ref 81–101)
MONO#: 0.3 10*3/uL (ref 0.1–0.9)
MONO%: 8.5 % (ref 0.0–13.0)
NEUT%: 57.1 % (ref 39.6–80.0)
NEUTROS ABS: 2.1 10*3/uL (ref 1.5–6.5)
Platelets: 221 10*3/uL (ref 145–400)
RBC: 3.44 10*6/uL — AB (ref 3.70–5.32)
RDW: 11.6 % (ref 11.1–15.7)
WBC: 3.8 10*3/uL — AB (ref 3.9–10.0)

## 2017-05-11 LAB — IRON AND TIBC
%SAT: 40 % (ref 21–57)
Iron: 100 ug/dL (ref 41–142)
TIBC: 250 ug/dL (ref 236–444)
UIBC: 150 ug/dL (ref 120–384)

## 2017-05-11 LAB — FERRITIN: Ferritin: 10 ng/ml (ref 9–269)

## 2017-05-12 ENCOUNTER — Encounter: Payer: Self-pay | Admitting: *Deleted

## 2017-05-13 ENCOUNTER — Ambulatory Visit (HOSPITAL_BASED_OUTPATIENT_CLINIC_OR_DEPARTMENT_OTHER): Payer: Medicare Other | Admitting: Family

## 2017-05-13 NOTE — Progress Notes (Signed)
Hematology and Oncology Follow Up Visit  Angela Burgess [id number] [birth year] 76 y.o. 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 She is doing quite well and has no complaints at this time. She has been quite busy caring for her husband. He had his prostate and bladder removed last month with urostomy placement. He is recuperating nicely and finally regaining his appetite.  Her iron studies are stable with ferritin of 10 and iron saturation of 40%. No phlebotomy needed at this time.  No fever, chills,n/v, cough, rash, dizziness, headache, vision changes, SOB, chest pain, palpitations, abdominal pain or changes in bowel or bladder habits.  No swelling, tenderness, numbness or tingling in his extremities. No c/o aches or pains at this time.  She has maintained a good appetite and is staying well hydrated. Her weight is stable.   ECOG Performance Status: 0 - Asymptomatic  Medications:  Allergies as of 05/13/2017      Reactions   Tetracycline Rash      Medication List       Accurate as of 05/13/17  9:25 AM. Always use your most recent med list.          aspirin 81 MG tablet Take 81 mg by mouth daily.   Astaxanthin 4 MG Caps Take 1 capsule by mouth 2 (two) times daily.   Calcium 500/D 500-125 MG-UNIT Tabs Generic drug:  Calcium Carbonate-Vitamin D Take by mouth.   diclofenac sodium 1 % Gel Commonly known as:  VOLTAREN 3 grams to 3 large joints up to three times a day   estradiol 0.1 MG/GM vaginal cream Commonly known as:  ESTRACE Place 1 Applicatorful vaginally 2 (two) times a week.   fish oil-omega-3 fatty acids 1000 MG capsule Take 2 g by mouth daily.   levothyroxine 112 MCG tablet Commonly known as:  SYNTHROID, LEVOTHROID TAKE 1 TABLET BY MOUTH EVERY DAY   metroNIDAZOLE 0.75 %  cream Commonly known as:  METROCREAM Apply 1 application topically daily.   multivitamin tablet Take 1 tablet by mouth daily.   Vitamin D3 2000 units Tabs Take by mouth every morning.       Allergies:  Allergies  Allergen Reactions  . Tetracycline Rash    Past Medical History, Surgical history, Social history, and Family History were reviewed and updated.  Review of Systems: All other 10 point review of systems is negative.   Physical Exam:  vitals were not taken for this visit.  Wt Readings from Last 3 Encounters:  02/02/17 127 lb 0.6 oz (57.6 kg)  12/23/16 130 lb (59 kg)  09/16/16 128 lb (58.1 kg)    Ocular: Sclerae unicteric, pupils equal, round and reactive to light Ear-nose-throat: Oropharynx clear, dentition fair Lymphatic: No cervical, supraclavicular or axillary adenopathy Lungs no rales or rhonchi, good excursion bilaterally Heart regular rate and rhythm, no murmur appreciated Abd soft, nontender, positive bowel sounds, no liver or spleen tip palpated on exam, no fluid wave  MSK no focal spinal tenderness, no joint edema Neuro: non-focal, well-oriented, appropriate affect Breasts: Deferred   Lab Results  Component Value Date   WBC 3.8 (L) 05/11/2017   HGB 11.9 05/11/2017   HCT 34.4 (L) 05/11/2017   MCV 100 05/11/2017   PLT 221 05/11/2017   Lab Results  Component Value Date   FERRITIN 10 05/11/2017  IRON 100 05/11/2017   TIBC 250 05/11/2017   UIBC 150 05/11/2017   IRONPCTSAT 40 05/11/2017   Lab Results  Component Value Date   RETICCTPCT 1.4 06/13/2015   RBC 3.44 (L) 05/11/2017   RETICCTABS 47.6 06/13/2015   No results found for: KPAFRELGTCHN, LAMBDASER, KAPLAMBRATIO No results found for: Kandis Cocking, IGMSERUM No results found for: Odetta Pink, SPEI   Chemistry      Component Value Date/Time   NA 140 05/11/2017 0901   NA 138 01/27/2017 1122   K 4.2 05/11/2017 0901   K 4.6  01/27/2017 1122   CL 102 05/11/2017 0901   CO2 33 05/11/2017 0901   CO2 29 01/27/2017 1122   BUN 15 05/11/2017 0901   BUN 14.6 01/27/2017 1122   CREATININE 0.9 05/11/2017 0901   CREATININE 0.8 01/27/2017 1122      Component Value Date/Time   CALCIUM 9.2 05/11/2017 0901   CALCIUM 9.7 01/27/2017 1122   ALKPHOS 55 05/11/2017 0901   ALKPHOS 58 01/27/2017 1122   AST 23 05/11/2017 0901   AST 18 01/27/2017 1122   ALT 14 05/11/2017 0901   ALT 13 01/27/2017 1122   BILITOT 0.80 05/11/2017 0901   BILITOT 0.42 01/27/2017 1122      Impression and Plan: Angela Burgess is a very pleasant 76 yo caucasian female with hemochromatosis, homozygous for C282Y mutation. Her iron studies are stable at this time so no phlebotomy needed today.  We will plan to see her back again for follow-up and lab in another 3 months once she and her husband return from [place].   She will contact our office with any questions or concerns and go to the ED in the event of an emergency. We can certainly see her sooner if need be.   Eliezer Bottom, NP 8/22/20189:25 AM

## 2017-06-12 ENCOUNTER — Encounter: Payer: Self-pay | Admitting: Internal Medicine

## 2017-06-16 DIAGNOSIS — Z85828 Personal history of other malignant neoplasm of skin: Secondary | ICD-10-CM | POA: Diagnosis not present

## 2017-06-16 DIAGNOSIS — Z08 Encounter for follow-up examination after completed treatment for malignant neoplasm: Secondary | ICD-10-CM | POA: Diagnosis not present

## 2017-06-18 DIAGNOSIS — Z961 Presence of intraocular lens: Secondary | ICD-10-CM | POA: Diagnosis not present

## 2017-07-01 NOTE — Progress Notes (Addendum)
Subjective:   Angela Burgess is a 76 y.o. female who presents for Medicare Annual (Subsequent) preventive examination.  The Patient was informed that the wellness visit is to identify future health risk and educate and initiate measures that can reduce risk for increased disease through the lifespan.    Annual Wellness Assessment  Reports health as good  Spouse is doing well now   Preventive Screening -Counseling & Management  Medicare Annual Preventive Care Visit - Subsequent Last OV 09/16/2016 for preventive  Mammogram 03/2013 dexa reported 2015/ osteopenia on problem list Was on medicine a long time ago  Dr. Gaetano Net is following both mammogram and dexa and will defer to him  Health Maintenance Due  Topic Date Due  . 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, knees, feet Stretches with knee; back raises; leg lifts with belt;  IT band stretch; (Yoga stretches)  Uses [place]  dream May consider water aerobics or walking in the pool   Stressors: spouse illness; but better now   Pain; taking acupuncture for back pain Given web site for the instit[employer]plement[employer] and Intergrative medicine for a reference to research on complementary therapies which may enhance a physician's medical plan but not used in place of medical treatment or evaluation    Cardiac Risk Factors Addressed Positive family hx  Hyperlipidemia - chol/hdl 4;chol 246; hdl 63; tirg 116  Diabetes neg A1c 5.1 2016  Obesity neg  Advanced Directives - completed   Patient Care Team: Panosh, Standley Brooking, MD as PCP - General Olevia Perches, Lowella Bandy, MD (Inactive)  (Gastroenterology) Everlene Farrier, MD as Attending Physician (Obstetrics and Gynecology) Bo Merino, MD (Rheumatology) inman   savage Marin Olp Rudell Cobb, MD as Consulting Physician (Oncology)         Objective:     Vitals: BP 110/60   Pulse 69   Ht 5\' 4"  (1.626 m)   Wt 126 lb 7 oz (57.4 kg)   SpO2 97%   BMI 21.70 kg/m   Body mass index is 21.7 kg/m.   Tobacco History  Smoking Status  . Never Smoker  Smokeless Tobacco  . Never Used    Comment: never used tobacco     Counseling given: Yes   Past Medical History:  Diagnosis Date  . Anemia   . Arthritis   . Cervical cancer ([facility]) 1968  . Dexamethasone adverse reaction 2008  . Elevated MCV    for years  . Eye exam abnormal 6/09  . Family history of malignant neoplasm of gastrointestinal tract   . Gastritis   . History of mammogram 1/10  . History of normal resting EKG 2005  . Hyperlipidemia   . Hypothyroidism   . IBS (irritable bowel syndrome)   . Macrocytosis    with theme eval in past and normal b12  . Osteopenia   . Rectocele   . Rosacea   . Tetanus-diphtheria (Td) vaccination 1999   Past Surgical History:  Procedure Laterality Date  . ABDOMINAL HYSTERECTOMY    . APPENDECTOMY    . BREAST LUMPECTOMY    .  CATARACT EXTRACTION, BILATERAL Bilateral    one in june and one in july; lens implant    Family History  Problem Relation Age of Onset  . Cirrhosis Mother   . Kidney disease Mother   . Allergy (severe) Mother        protein allergy that sent posion to her brain  . Leukemia Father   . Heart attack Father 53  . Diabetes Father   . Bipolar disorder Sister   . Stroke Sister   . Lung cancer Brother        smoker  . Seizures Brother   . Bipolar disorder Son   . Colon cancer Brother   . Seizures Sister   . Seizures Sister    History  Sexual Activity  . Sexual activity: Not on file    Outpatient Encounter Prescriptions as of 07/02/2017  Medication Sig  . aspirin 81 MG tablet Take 81  mg by mouth daily.  . Astaxanthin 4 MG CAPS Take 1 capsule by mouth 2 (two) times daily.  . Calcium Carbonate-Vitamin D (CALCIUM 500/D) 500-125 MG-UNIT TABS Take by mouth.    . Cholecalciferol (VITAMIN D3) 2000 UNITS TABS Take by mouth every morning.  . diclofenac sodium (VOLTAREN) 1 % GEL 3 grams to 3 large joints up to three times a day  . estradiol (ESTRACE) 0.1 MG/GM vaginal cream Place 1 Applicatorful vaginally 2 (two) times a week.   . fish oil-omega-3 fatty acids 1000 MG capsule Take 2 g by mouth daily.    [name] levothyroxine (SYNTHROID, LEVOTHROID) 112 MCG tablet TAKE 1 TABLET BY MOUTH EVERY DAY  . metroNIDAZOLE (METROCREAM) 0.75 % cream Apply 1 application topically daily.    . Multiple Vitamin (MULTIVITAMIN) tablet Take 1 tablet by mouth daily.     No facility-administered encounter medications on file as of 07/02/2017.     Activities of Daily Living In your present state of health, do you have any difficulty performing the following activities: 07/02/2017 09/16/2016  Hearing? N N  Vision? N N  Comment - some ? due to cateracts   Difficulty concentrating or making decisions? N N  Walking or climbing stairs? N N  Dressing or bathing? N N  Doing errands, shopping? N N  Preparing Food and eating ? N N  Using the Toilet? N N  In the past six months, have you accidently leaked urine? N N  Comment has a pessary  -  Do you have problems with loss of bowel control? N N  Managing your Medications? N N  Managing your Finances? N N  Housekeeping or managing your Housekeeping? N N  Some recent data might be hidden    Patient Care Team: Panosh, Standley Brooking, MD as PCP - General Olevia Perches Lowella Bandy, MD (Inactive) (Gastroenterology) Everlene Farrier, MD as Attending Physician (Obstetrics and Gynecology) Bo Merino, MD (Rheumatology) inman   savage [place] Olp Rudell Cobb, MD as Consulting Physician (Oncology)    Assessment:     Exercise Activities and Dietary recommendations Does stretches  every day Discussed trying pool exercise for arthritis and walking in the pool      Goals    . Exercise 150 minutes per week (moderate activity)          Would like to be able to garden more; walk more  May try water aerobics  Walk in the pool     . patient          Enjoy the daily journey when possible;  Fall Risk Fall Risk  07/02/2017 11/12/2016 09/16/2016 07/24/2016 04/21/2016  Falls in the past year? No No No No No   Depression Screen PHQ 2/9 Scores 07/02/2017 09/16/2016 09/18/2015 09/05/2014  PHQ - 2 Score 0 0 0 0     Cognitive Function MMSE - Mini Mental State Exam 07/02/2017 09/16/2016  Not completed: (No Data) (No Data)  Ad8 score reviewed for issues:  Issues making decisions:  Less interest in hobbies / activities:  Repeats questions, stories (family complaining):  Trouble using ordinary gadgets (microwave, computer, phone):  Forgets the month or year:   Mismanaging finances:   Remembering appts:  Daily problems with thinking and/or memory: Ad8 score is=0       Immunization History  Administered Date(s) Administered  . Influenza Split 06/22/2013  . Influenza Whole 07/07/2007, 07/05/2008, 07/23/2009, 05/21/2010  . Influenza,inj,Quad PF,6+ Mos 07/24/2016  . Influenza-Unspecified 07/06/2014  . Pneumococcal Conjugate-13 12/21/2013  . Pneumococcal Polysaccharide-23 01/31/2008  . Td 09/22/1997, 11/13/2009  . Zoster 05/21/2010   Screening Tests Health Maintenance  Topic Date Due  . INFLUENZA VACCINE  04/22/2017  . TETANUS/TDAP  11/14/2019  . DEXA SCAN  Completed  . PNA vac Low Risk Adult  Completed      Plan:      PCP Notes   Health Maintenance The patient had sinus issues today; Decided to postpone her flu vaccine for one week. Preferred to take it when she was feeling better  Dexa and mammogram followed by dr. Gaetano Net   Abnormal Screens  None   Referrals  None  Stated she noticed some changing in her hearing but does not  feel she needs hearing aids   Patient concerns; None   Nurse Concerns; As noted   Next PCP apt Jan 2015   I have personally reviewed and noted the following in the patient's chart:   . Medical and social history . Use of alcohol, tobacco or illicit drugs  . Current medications and supplements . Functional ability and status . Nutritional status . Physical activity . Advanced directives . List of other physicians . Hospitalizations, surgeries, and ER visits in previous 12 months . Vitals . Screenings to include cognitive, depression, and falls . Referrals and appointments  In addition, I have reviewed and discussed with patient certain preventive protocols, quality metrics, and best practice recommendations. A written personalized care plan for preventive services as well as general preventive health recommendations were provided to patient.     XVQMG,QQPYP, RN  07/02/2017 Above noted reviewed and agree. Lottie Dawson, MD

## 2017-07-02 ENCOUNTER — Ambulatory Visit (INDEPENDENT_AMBULATORY_CARE_PROVIDER_SITE_OTHER): Payer: Medicare Other

## 2017-07-02 VITALS — BP 110/60 | HR 69 | Ht 64.0 in | Wt 126.4 lb

## 2017-07-02 DIAGNOSIS — Z0000 Encounter for general adult medical examination without abnormal findings: Secondary | ICD-10-CM | POA: Diagnosis not present

## 2017-07-02 NOTE — Patient Instructions (Addendum)
Angela Burgess , Thank you for taking time to come for your Medicare Wellness Visit. I appreciate your ongoing commitment to your health goals. Please review the following plan we discussed and let me know if I can assist you in the future.   Will defer the flu vaccine for about a week until you are feeling better  [email or web address] Here you will find information on complementary medicine with recommendations that have been studied   [facility] [place] - can assist with hearing aid x 1  No reviews  [facility]  [place] [id number]  [phone]    These are the goals we discussed: Goals    . patient          Enjoy the daily journey when possible;        This is a list of the screening recommended for you and due dates:  Health Maintenance  Topic Date Due  . Flu Shot  04/22/2017  . Tetanus Vaccine  11/14/2019  . DEXA scan (bone density measurement)  Completed  . Pneumonia vaccines  Completed    Prevention of falls: Remove rugs or any tripping hazards in the home Use Non slip mats in bathtubs and showers Placing grab bars next to the toilet and or shower Placing handrails on both sides of the stair way Adding extra lighting in the home.   Personal safety issues reviewed:  1. Consider starting a community watch program per [facility] 2.  Changes batteries is smoke detector and/or carbon monoxide detector  3.  If you have firearms; keep them in a safe place 4.  Wear protection when in the sun; Always wear sunscreen or a hat; It is good to have your doctor check your skin annually or review any new areas of concern 5. Driving safety; Keep in the right lane; stay 3 car lengths behind the car in front of you on the highway; look 3 times prior to pulling out; carry your cell phone everywhere you go!    Learn about the Yellow Dot program:  The program allows first responders at your emergency to have access to who your  physician is, as well as your medications and medical conditions.  Citizens requesting the Yellow Dot Packages should contact Master Corporal Nunzio Cobbs at the [facility] [phone] for the first week of the program and beginning the week after Easter citizens should contact their Scientist, physiological.       Fall Prevention in the Home Falls can cause injuries. They can happen to people of all ages. There are many things you can do to make your home safe and to help prevent falls. What can I do on the outside of my home?  Regularly fix the edges of walkways and driveways and fix any cracks.  Remove anything that might make you trip as you walk through a door, such as a raised step or threshold.  Trim any bushes or trees on the path to your home.  Use bright outdoor lighting.  Clear any walking paths of anything that might make someone trip, such as rocks or tools.  Regularly check to see if handrails are loose or broken. Make sure that both sides of any steps have handrails.  Any raised decks and porches should have guardrails on the edges.  Have any leaves, snow, or ice cleared regularly.  Use sand or salt on walking paths during winter.  Clean up any  spills in your garage right away. This includes oil or grease spills. What can I do in the bathroom?  Use night lights.  Install grab bars by the toilet and in the tub and shower. Do not use towel bars as grab bars.  Use non-skid mats or decals in the tub or shower.  If you need to sit down in the shower, use a plastic, non-slip stool.  Keep the floor dry. Clean up any water that spills on the floor as soon as it happens.  Remove soap buildup in the tub or shower regularly.  Attach bath mats securely with double-sided non-slip rug tape.  Do not have throw rugs and other things on the floor that can make you trip. What can I do in the bedroom?  Use night lights.  Make sure that you  have a light by your bed that is easy to reach.  Do not use any sheets or blankets that are too big for your bed. They should not hang down onto the floor.  Have a firm chair that has side arms. You can use this for support while you get dressed.  Do not have throw rugs and other things on the floor that can make you trip. What can I do in the kitchen?  Clean up any spills right away.  Avoid walking on wet floors.  Keep items that you use a lot in easy-to-reach places.  If you need to reach something above you, use a strong step stool that has a grab bar.  Keep electrical cords out of the way.  Do not use floor polish or wax that makes floors slippery. If you must use wax, use non-skid floor wax.  Do not have throw rugs and other things on the floor that can make you trip. What can I do with my stairs?  Do not leave any items on the stairs.  Make sure that there are handrails on both sides of the stairs and use them. Fix handrails that are broken or loose. Make sure that handrails are as long as the stairways.  Check any carpeting to make sure that it is firmly attached to the stairs. Fix any carpet that is loose or worn.  Avoid having throw rugs at the top or bottom of the stairs. If you do have throw rugs, attach them to the floor with carpet tape.  Make sure that you have a light switch at the top of the stairs and the bottom of the stairs. If you do not have them, ask someone to add them for you. What else can I do to help prevent falls?  Wear shoes that: ? Do not have high heels. ? Have rubber bottoms. ? Are comfortable and fit you well. ? Are closed at the toe. Do not wear sandals.  If you use a stepladder: ? Make sure that it is fully opened. Do not climb a closed stepladder. ? Make sure that both sides of the stepladder are locked into place. ? Ask someone to hold it for you, if possible.  Clearly mark and make sure that you can see: ? Any grab bars or  handrails. ? First and last steps. ? Where the edge of each step is.  Use tools that help you move around (mobility aids) if they are needed. These include: ? Canes. ? Walkers. ? Scooters. ? Crutches.  Turn on the lights when you go into a dark area. Replace any light bulbs as soon as they burn  out.  Set up your furniture so you have a clear path. Avoid moving your furniture around.  If any of your floors are uneven, fix them.  If there are any pets around you, be aware of where they are.  Review your medicines with your doctor. Some medicines can make you feel dizzy. This can increase your chance of falling. Ask your doctor what other things that you can do to help prevent falls. This information is not intended to replace advice given to you by your health care provider. Make sure you discuss any questions you have with your health care provider. Document Released: 07/05/2009 Document Revised: 02/14/2016 Document Reviewed: 10/13/2014 Elsevier Interactive Patient Education  2018 [place] Maintenance, Female Adopting a healthy lifestyle and getting preventive care can go a long way to promote health and wellness. Talk with your health care provider about what schedule of regular examinations is right for you. This is a good chance for you to check in with your provider about disease prevention and staying healthy. In between checkups, there are plenty of things you can do on your own. Experts have done a lot of research about which lifestyle changes and preventive measures are most likely to keep you healthy. Ask your health care provider for more information. Weight and diet Eat a healthy diet  Be sure to include plenty of vegetables, fruits, low-fat dairy products, and lean protein.  Do not eat a lot of foods high in solid fats, added sugars, or salt.  Get regular exercise. This is one of the most important things you can do for your health. ? Most adults should  exercise for at least 150 minutes each week. The exercise should increase your heart rate and make you sweat (moderate-intensity exercise). ? Most adults should also do strengthening exercises at least twice a week. This is in addition to the moderate-intensity exercise.  Maintain a healthy weight  Body mass index (BMI) is a measurement that can be used to identify possible weight problems. It estimates body fat based on height and weight. Your health care provider can help determine your BMI and help you achieve or maintain a healthy weight.  For females 65 years of age and older: ? A BMI below 18.5 is considered underweight. ? A BMI of 18.5 to 24.9 is normal. ? A BMI of 25 to 29.9 is considered overweight. ? A BMI of 30 and above is considered obese.  Watch levels of cholesterol and blood lipids  You should start having your blood tested for lipids and cholesterol at 77 years of age, then have this test every 5 years.  You may need to have your cholesterol levels checked more often if: ? Your lipid or cholesterol levels are high. ? You are older than 77 years of age. ? You are at high risk for heart disease.  Cancer screening Lung Cancer  Lung cancer screening is recommended for adults 34-35 years old who are at high risk for lung cancer because of a history of smoking.  A yearly low-dose CT scan of the lungs is recommended for people who: ? Currently smoke. ? Have quit within the past 15 years. ? Have at least a 30-pack-year history of smoking. A pack year is smoking an average of one pack of cigarettes a day for 1 year.  Yearly screening should continue until it has been 15 years since you quit.  Yearly screening should stop if you develop a health problem that would prevent  you from having lung cancer treatment.  Breast Cancer  Practice breast self-awareness. This means understanding how your breasts normally appear and feel.  It also means doing regular breast  self-exams. Let your health care provider know about any changes, no matter how small.  If you are in your 20s or 30s, you should have a clinical breast exam (CBE) by a health care provider every 1-3 years as part of a regular health exam.  If you are 54 or older, have a CBE every year. Also consider having a breast X-ray (mammogram) every year.  If you have a family history of breast cancer, talk to your health care provider about genetic screening.  If you are at high risk for breast cancer, talk to your health care provider about having an MRI and a mammogram every year.  Breast cancer gene (BRCA) assessment is recommended for women who have family members with BRCA-related cancers. BRCA-related cancers include: ? Breast. ? Ovarian. ? Tubal. ? Peritoneal cancers.  Results of the assessment will determine the need for genetic counseling and BRCA1 and BRCA2 testing.  Cervical Cancer Your health care provider may recommend that you be screened regularly for cancer of the pelvic organs (ovaries, uterus, and vagina). This screening involves a pelvic examination, including checking for microscopic changes to the surface of your cervix (Pap test). You may be encouraged to have this screening done every 3 years, beginning at age 82.  For women ages 64-65, health care providers may recommend pelvic exams and Pap testing every 3 years, or they may recommend the Pap and pelvic exam, combined with testing for human papilloma virus (HPV), every 5 years. Some types of HPV increase your risk of cervical cancer. Testing for HPV may also be done on women of any age with unclear Pap test results.  Other health care providers may not recommend any screening for nonpregnant women who are considered low risk for pelvic cancer and who do not have symptoms. Ask your health care provider if a screening pelvic exam is right for you.  If you have had past treatment for cervical cancer or a condition that could  lead to cancer, you need Pap tests and screening for cancer for at least 20 years after your treatment. If Pap tests have been discontinued, your risk factors (such as having a new sexual partner) need to be reassessed to determine if screening should resume. Some women have medical problems that increase the chance of getting cervical cancer. In these cases, your health care provider may recommend more frequent screening and Pap tests.  Colorectal Cancer  This type of cancer can be detected and often prevented.  Routine colorectal cancer screening usually begins at 77 years of age and continues through 77 years of age.  Your health care provider may recommend screening at an earlier age if you have risk factors for colon cancer.  Your health care provider may also recommend using home test kits to check for hidden blood in the stool.  A small camera at the end of a tube can be used to examine your colon directly (sigmoidoscopy or colonoscopy). This is done to check for the earliest forms of colorectal cancer.  Routine screening usually begins at age 21.  Direct examination of the colon should be repeated every 5-10 years through 77 years of age. However, you may need to be screened more often if early forms of precancerous polyps or small growths are found.  Skin Cancer  Check your skin  from head to toe regularly.  Tell your health care provider about any new moles or changes in moles, especially if there is a change in a mole's shape or color.  Also tell your health care provider if you have a mole that is larger than the size of a pencil eraser.  Always use sunscreen. Apply sunscreen liberally and repeatedly throughout the day.  Protect yourself by wearing long sleeves, pants, a wide-brimmed hat, and sunglasses whenever you are outside.  Heart disease, diabetes, and high blood pressure  High blood pressure causes heart disease and increases the risk of stroke. High blood pressure  is more likely to develop in: ? People who have blood pressure in the high end of the normal range (130-139/85-89 mm Hg). ? People who are overweight or obese. ? People who are African American.  If you are 41-79 years of age, have your blood pressure checked every 3-5 years. If you are 70 years of age or older, have your blood pressure checked every year. You should have your blood pressure measured twice-once when you are at a hospital or clinic, and once when you are not at a hospital or clinic. Record the average of the two measurements. To check your blood pressure when you are not at a hospital or clinic, you can use: ? An automated blood pressure machine at a pharmacy. ? A home blood pressure monitor.  If you are between 37 years and 23 years old, ask your health care provider if you should take aspirin to prevent strokes.  Have regular diabetes screenings. This involves taking a blood sample to check your fasting blood sugar level. ? If you are at a normal weight and have a low risk for diabetes, have this test once every three years after 77 years of age. ? If you are overweight and have a high risk for diabetes, consider being tested at a younger age or more often. Preventing infection Hepatitis B  If you have a higher risk for hepatitis B, you should be screened for this virus. You are considered at high risk for hepatitis B if: ? You were born in a country where hepatitis B is common. Ask your health care provider which countries are considered high risk. ? Your parents were born in a high-risk country, and you have not been immunized against hepatitis B (hepatitis B vaccine). ? You have HIV or AIDS. ? You use needles to inject street drugs. ? You live with someone who has hepatitis B. ? You have had sex with someone who has hepatitis B. ? You get hemodialysis treatment. ? You take certain medicines for conditions, including cancer, organ transplantation, and autoimmune  conditions.  Hepatitis C  Blood testing is recommended for: ? Everyone born from [birth year] through 1966. ? Anyone with known risk factors for hepatitis C.  Sexually transmitted infections (STIs)  You should be screened for sexually transmitted infections (STIs) including gonorrhea and chlamydia if: ? You are sexually active and are younger than 77 years of age. ? You are older than 77 years of age and your health care provider tells you that you are at risk for this type of infection. ? Your sexual activity has changed since you were last screened and you are at an increased risk for chlamydia or gonorrhea. Ask your health care provider if you are at risk.  If you do not have HIV, but are at risk, it may be recommended that you take a prescription medicine daily to  prevent HIV infection. This is called pre-exposure prophylaxis (PrEP). You are considered at risk if: ? You are sexually active and do not regularly use condoms or know the HIV status of your partner(s). ? You take drugs by injection. ? You are sexually active with a partner who has HIV.  Talk with your health care provider about whether you are at high risk of being infected with HIV. If you choose to begin PrEP, you should first be tested for HIV. You should then be tested every 3 months for as long as you are taking PrEP. Pregnancy  If you are premenopausal and you may become pregnant, ask your health care provider about preconception counseling.  If you may become pregnant, take 400 to 800 micrograms (mcg) of folic acid every day.  If you want to prevent pregnancy, talk to your health care provider about birth control (contraception). Osteoporosis and menopause  Osteoporosis is a disease in which the bones lose minerals and strength with aging. This can result in serious bone fractures. Your risk for osteoporosis can be identified using a bone density scan.  If you are 65 years of age or older, or if you are at risk for  osteoporosis and fractures, ask your health care provider if you should be screened.  Ask your health care provider whether you should take a calcium or vitamin D supplement to lower your risk for osteoporosis.  Menopause may have certain physical symptoms and risks.  Hormone replacement therapy may reduce some of these symptoms and risks. Talk to your health care provider about whether hormone replacement therapy is right for you. Follow these instructions at home:  Schedule regular health, dental, and eye exams.  Stay current with your immunizations.  Do not use any tobacco products including cigarettes, chewing tobacco, or electronic cigarettes.  If you are pregnant, do not drink alcohol.  If you are breastfeeding, limit how much and how often you drink alcohol.  Limit alcohol intake to no more than 1 drink per day for nonpregnant women. One drink equals 12 ounces of beer, 5 ounces of wine, or 1 ounces of hard liquor.  Do not use street drugs.  Do not share needles.  Ask your health care provider for help if you need support or information about quitting drugs.  Tell your health care provider if you often feel depressed.  Tell your health care provider if you have ever been abused or do not feel safe at home. This information is not intended to replace advice given to you by your health care provider. Make sure you discuss any questions you have with your health care provider. Document Released: 03/24/2011 Document Revised: 02/14/2016 Document Reviewed: 06/12/2015 Elsevier Interactive Patient Education  [employer].

## 2017-08-08 ENCOUNTER — Other Ambulatory Visit: Payer: Self-pay | Admitting: Internal Medicine

## 2017-08-18 ENCOUNTER — Other Ambulatory Visit: Payer: Medicare Other

## 2017-08-20 ENCOUNTER — Ambulatory Visit: Payer: Medicare Other | Admitting: Hematology & Oncology

## 2017-08-24 ENCOUNTER — Telehealth (INDEPENDENT_AMBULATORY_CARE_PROVIDER_SITE_OTHER): Payer: Self-pay | Admitting: Orthopaedic Surgery

## 2017-08-24 ENCOUNTER — Other Ambulatory Visit (HOSPITAL_BASED_OUTPATIENT_CLINIC_OR_DEPARTMENT_OTHER): Payer: Medicare Other

## 2017-08-24 DIAGNOSIS — M25561 Pain in right knee: Secondary | ICD-10-CM

## 2017-08-24 DIAGNOSIS — M79642 Pain in left hand: Principal | ICD-10-CM

## 2017-08-24 DIAGNOSIS — M79641 Pain in right hand: Secondary | ICD-10-CM

## 2017-08-24 DIAGNOSIS — M545 Low back pain: Secondary | ICD-10-CM

## 2017-08-24 DIAGNOSIS — M25562 Pain in left knee: Secondary | ICD-10-CM

## 2017-08-24 DIAGNOSIS — G8929 Other chronic pain: Secondary | ICD-10-CM

## 2017-08-24 LAB — CBC WITH DIFFERENTIAL (CANCER CENTER ONLY)
BASO#: 0 10*3/uL (ref 0.0–0.2)
BASO%: 0.5 % (ref 0.0–2.0)
EOS ABS: 0.1 10*3/uL (ref 0.0–0.5)
EOS%: 1.7 % (ref 0.0–7.0)
HCT: 34.9 % (ref 34.8–46.6)
HEMOGLOBIN: 12.1 g/dL (ref 11.6–15.9)
LYMPH#: 1.2 10*3/uL (ref 0.9–3.3)
LYMPH%: 29.1 % (ref 14.0–48.0)
MCH: 35.5 pg — AB (ref 26.0–34.0)
MCHC: 34.7 g/dL (ref 32.0–36.0)
MCV: 102 fL — ABNORMAL HIGH (ref 81–101)
MONO#: 0.4 10*3/uL (ref 0.1–0.9)
MONO%: 9.6 % (ref 0.0–13.0)
NEUT%: 59.1 % (ref 39.6–80.0)
NEUTROS ABS: 2.4 10*3/uL (ref 1.5–6.5)
PLATELETS: 226 10*3/uL (ref 145–400)
RBC: 3.41 10*6/uL — AB (ref 3.70–5.32)
RDW: 12 % (ref 11.1–15.7)
WBC: 4.1 10*3/uL (ref 3.9–10.0)

## 2017-08-24 LAB — CMP (CANCER CENTER ONLY)
ALT(SGPT): 18 U/L (ref 10–47)
AST: 22 U/L (ref 11–38)
Albumin: 3.5 g/dL (ref 3.3–5.5)
Alkaline Phosphatase: 65 U/L (ref 26–84)
BILIRUBIN TOTAL: 0.6 mg/dL (ref 0.20–1.60)
BUN: 16 mg/dL (ref 7–22)
CHLORIDE: 102 meq/L (ref 98–108)
CO2: 31 mEq/L (ref 18–33)
CREATININE: 1.1 mg/dL (ref 0.6–1.2)
Calcium: 9.5 mg/dL (ref 8.0–10.3)
Glucose, Bld: 99 mg/dL (ref 73–118)
Potassium: 3.9 mEq/L (ref 3.3–4.7)
SODIUM: 145 meq/L (ref 128–145)
TOTAL PROTEIN: 6.6 g/dL (ref 6.4–8.1)

## 2017-08-24 LAB — IRON AND TIBC
%SAT: 61 % — ABNORMAL HIGH (ref 21–57)
IRON: 143 ug/dL — AB (ref 41–142)
TIBC: 234 ug/dL — ABNORMAL LOW (ref 236–444)
UIBC: 91 ug/dL — ABNORMAL LOW (ref 120–384)

## 2017-08-24 LAB — FERRITIN: FERRITIN: 15 ng/mL (ref 9–269)

## 2017-08-24 NOTE — Telephone Encounter (Signed)
Patient did not go to PT from last appt, and now would like to start that. Patient unable to find rx for that. Patient wants to go to [facility] [place] on [address]. Please call patient to advise.

## 2017-08-25 NOTE — Telephone Encounter (Signed)
DR. D? 

## 2017-08-27 ENCOUNTER — Encounter: Payer: Self-pay | Admitting: Hematology & Oncology

## 2017-08-27 ENCOUNTER — Other Ambulatory Visit: Payer: Self-pay

## 2017-08-27 ENCOUNTER — Ambulatory Visit (HOSPITAL_BASED_OUTPATIENT_CLINIC_OR_DEPARTMENT_OTHER): Payer: Medicare Other

## 2017-08-27 ENCOUNTER — Ambulatory Visit (HOSPITAL_BASED_OUTPATIENT_CLINIC_OR_DEPARTMENT_OTHER): Payer: Medicare Other | Admitting: Hematology & Oncology

## 2017-08-27 MED ORDER — SODIUM CHLORIDE 0.9 % IV SOLN
Freq: Once | INTRAVENOUS | Status: AC
  Administered 2017-08-27: 10:00:00 via INTRAVENOUS

## 2017-08-27 NOTE — Progress Notes (Signed)
1 unit therapeutic phlebotomy performed over 15 minute using a 18 gauge IV set. IV hydration started. Patient tolerated well. Nourishment provided.   Patient states she receives IV fluids over 1 hour instead of 2 hours.

## 2017-08-27 NOTE — Progress Notes (Signed)
Hematology and Oncology Follow Up Visit  Angela Burgess [id number] 26-Apr-1941 76 y.o. 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  She did have a nice Thanksgiving.  It was relatively quiet.  She had blood work done a couple days ago.  Angela ferritin was only 15 but Angela iron saturation was 61%.  As such, we will go ahead and phlebotomize Angela today.  She has had no problems with fever.  She had no cough or shortness of breath.  She has had no nausea or vomiting.  She has had no change in bowel or bladder habits.  Thankfully, Angela Burgess is doing well.  I think he had bladder cancer and had his bladder removed.  Overall, Angela performance status is ECOG 0.   Medications:  Allergies as of 08/27/2017      Reactions   Tetracycline Rash      Medication List        Accurate as of 08/27/17  9:25 AM. Always use your most recent med list.          aspirin 81 MG tablet Take 81 mg by mouth daily.   Astaxanthin 4 MG Caps Take 1 capsule by mouth 2 (two) times daily.   Calcium 500/D 500-125 MG-UNIT Tabs Generic drug:  Calcium Carbonate-Vitamin D Take by mouth.   diclofenac sodium 1 % Gel Commonly known as:  VOLTAREN 3 grams to 3 large joints up to three times a day   estradiol 0.1 MG/GM vaginal cream Commonly known as:  ESTRACE Place 1 Applicatorful vaginally 2 (two) times a week.   fish oil-omega-3 fatty acids 1000 MG capsule Take 2 g by mouth daily.   levothyroxine 112 MCG tablet Commonly known as:  SYNTHROID, LEVOTHROID TAKE 1 TABLET BY MOUTH EVERY DAY   metroNIDAZOLE 0.75 % cream Commonly known as:  METROCREAM Apply 1 application topically daily.   multivitamin tablet Take 1 tablet by mouth daily.   Vitamin D3 2000 units Tabs Take by mouth every morning.       Allergies:    Allergies  Allergen Reactions  . Tetracycline Rash    Past Medical History, Surgical history, Social history, and Family History were reviewed and updated.  Review of Systems:  As in the interim history  Physical Exam:  vitals were not taken for this visit.   Wt Readings from Last 3 Encounters:  07/02/17 126 lb 7 oz (57.4 kg)  05/13/17 124 lb (56.2 kg)  02/02/17 127 lb 0.6 oz (57.6 kg)    Physical Exam  Constitutional: She is oriented to person, place, and time.  HENT:  Head: Normocephalic and atraumatic.  Mouth/Throat: Oropharynx is clear and moist.  Eyes: EOM are normal. Pupils are equal, round, and reactive to light.  Neck: Normal range of motion.  Cardiovascular: Normal rate, regular rhythm and normal heart sounds.  Pulmonary/Chest: Effort normal and breath sounds normal.  Abdominal: Soft. Bowel sounds are normal.  Musculoskeletal: Normal range of motion. She exhibits no edema, tenderness or deformity.  Lymphadenopathy:    She has no cervical adenopathy.  Neurological: She is alert and oriented to person, place, and time.  Skin: Skin is warm and dry. No rash noted. No erythema.  Psychiatric: She has a normal mood and affect. Angela behavior is normal. Judgment and thought content normal.  Vitals  reviewed.    Lab Results  Component Value Date   WBC 4.1 08/24/2017   HGB 12.1 08/24/2017   HCT 34.9 08/24/2017   MCV 102 (H) 08/24/2017   PLT 226 08/24/2017   Lab Results  Component Value Date   FERRITIN 15 08/24/2017   IRON 143 (H) 08/24/2017   TIBC 234 (L) 08/24/2017   UIBC 91 (L) 08/24/2017   IRONPCTSAT 61 (H) 08/24/2017   Lab Results  Component Value Date   RETICCTPCT 1.4 06/13/2015   RBC 3.41 (L) 08/24/2017   RETICCTABS 47.6 06/13/2015   No results found for: KPAFRELGTCHN, LAMBDASER, KAPLAMBRATIO No results found for: IGGSERUM, IGA, IGMSERUM No results found for: Odetta Pink, SPEI   Chemistry       Component Value Date/Time   NA 145 08/24/2017 1052   NA 138 01/27/2017 1122   K 3.9 08/24/2017 1052   K 4.6 01/27/2017 1122   CL 102 08/24/2017 1052   CO2 31 08/24/2017 1052   CO2 29 01/27/2017 1122   BUN 16 08/24/2017 1052   BUN 14.6 01/27/2017 1122   CREATININE 1.1 08/24/2017 1052   CREATININE 0.8 01/27/2017 1122      Component Value Date/Time   CALCIUM 9.5 08/24/2017 1052   CALCIUM 9.7 01/27/2017 1122   ALKPHOS 65 08/24/2017 1052   ALKPHOS 58 01/27/2017 1122   AST 22 08/24/2017 1052   AST 18 01/27/2017 1122   ALT 18 08/24/2017 1052   ALT 13 01/27/2017 1122   BILITOT 0.60 08/24/2017 1052   BILITOT 0.42 01/27/2017 1122      Impression and Plan: [name] is a very pleasant 76 yo caucasian female with hemochromatosis, homozygous for C282Y mutation.   We will go ahead and plan to phlebotomize Angela today.  We will get Angela back in 3 months.  We will have Angela labs done before we see Angela.  I know that she will have a wonderful Christmas.     [name], MD 12/6/20189:25 AM

## 2017-08-27 NOTE — Patient Instructions (Signed)
Therapeutic Phlebotomy Therapeutic phlebotomy is the controlled removal of blood from a person's body for the purpose of treating a medical condition. The procedure is similar to donating blood. Usually, about a pint (470 mL, or 0.47L) of blood is removed. The average adult has 9-12 pints (4.3-5.7 L) of blood. Therapeutic phlebotomy may be used to treat the following medical conditions:  Hemochromatosis. This is a condition in which the blood contains too much iron.  Polycythemia vera. This is a condition in which the blood contains too many red blood cells.  Porphyria cutanea tarda. This is a disease in which an important part of hemoglobin is not made properly. It results in the buildup of abnormal amounts of porphyrins in the body.  Sickle cell disease. This is a condition in which the red blood cells form an abnormal crescent shape rather than a round shape.  Tell a health care provider about:  Any allergies you have.  All medicines you are taking, including vitamins, herbs, eye drops, creams, and over-the-counter medicines.  Any problems you or family members have had with anesthetic medicines.  Any blood disorders you have.  Any surgeries you have had.  Any medical conditions you have. What are the risks? Generally, this is a safe procedure. However, problems may occur, including:  Nausea or light-headedness.  Low blood pressure.  Soreness, bleeding, swelling, or bruising at the needle insertion site.  Infection.  What happens before the procedure?  Follow instructions from your health care provider about eating or drinking restrictions.  Ask your health care provider about changing or stopping your regular medicines. This is especially important if you are taking diabetes medicines or blood thinners.  Wear clothing with sleeves that can be raised above the elbow.  Plan to have someone take you home after the procedure.  You may have a blood sample taken. What  happens during the procedure?  A needle will be inserted into one of your veins.  Tubing and a collection bag will be attached to that needle.  Blood will flow through the needle and tubing into the collection bag.  You may be asked to open and close your hand slowly and continually during the entire collection.  After the specified amount of blood has been removed from your body, the collection bag and tubing will be clamped.  The needle will be removed from your vein.  Pressure will be held on the site of the needle insertion to stop the bleeding.  A bandage (dressing) will be placed over the needle insertion site. The procedure may vary among health care providers and hospitals. What happens after the procedure?  Your recovery will be assessed and monitored.  You can return to your normal activities as directed by your health care provider. This information is not intended to replace advice given to you by your health care provider. Make sure you discuss any questions you have with your health care provider. Document Released: 02/10/2011 Document Revised: 05/10/2016 Document Reviewed: 09/04/2014 Elsevier Interactive Patient Education  2018 Elsevier Inc.  

## 2017-08-28 NOTE — Telephone Encounter (Signed)
Referral has been placed. 

## 2017-08-28 NOTE — Addendum Note (Signed)
Addended by: Carole Binning on: 08/28/2017 10:12 AM   Modules accepted: Orders

## 2017-09-03 ENCOUNTER — Ambulatory Visit (INDEPENDENT_AMBULATORY_CARE_PROVIDER_SITE_OTHER): Payer: Medicare Other | Admitting: Physical Therapy

## 2017-09-03 ENCOUNTER — Encounter: Payer: Self-pay | Admitting: Physical Therapy

## 2017-09-03 DIAGNOSIS — M25551 Pain in right hip: Secondary | ICD-10-CM

## 2017-09-03 DIAGNOSIS — M62838 Other muscle spasm: Secondary | ICD-10-CM | POA: Diagnosis not present

## 2017-09-03 DIAGNOSIS — M25651 Stiffness of right hip, not elsewhere classified: Secondary | ICD-10-CM | POA: Diagnosis not present

## 2017-09-03 NOTE — Therapy (Signed)
[facility] [place], [place], [address] Phone: [phone]   Fax:  [phone]  Physical Therapy Evaluation  Patient Details  Name: Angela Burgess MRN: [id number] Date of Birth: [birth year] Referring Provider: Dr Cy Blamer   Encounter Date: 09/03/2017  PT End of Session - 09/03/17 1054    Visit Number  1    Number of Visits  6    Date for PT Re-Evaluation  10/15/17    PT Start Time  1057    PT Stop Time  1154    PT Time Calculation (min)  57 min    Activity Tolerance  Patient tolerated treatment well       Past Medical History:  Diagnosis Date  . Anemia   . Arthritis   . Cervical cancer ([place]) 1968  . Dexamethasone adverse reaction 2008  . Elevated MCV    for years  . Eye exam abnormal 6/09  . Family history of malignant neoplasm of gastrointestinal tract   . Gastritis   . History of mammogram 1/10  . History of normal resting EKG 2005  . Hyperlipidemia   . Hypothyroidism   . IBS (irritable bowel syndrome)   . Macrocytosis    with theme eval in past and normal b12  . Osteopenia   . Rectocele   . Rosacea   . Tetanus-diphtheria (Td) vaccination 1999    Past Surgical History:  Procedure Laterality Date  . ABDOMINAL HYSTERECTOMY    . APPENDECTOMY    . BREAST LUMPECTOMY    . CATARACT EXTRACTION, BILATERAL Bilateral    one in june and one in july; lens implant     There were no vitals filed for this visit.   Subjective Assessment - 09/03/17 1054    Subjective  Pt reports her biggest are of pain is her Rt buttocks and hip and still has the tightness there.  This is the area she wishes for [place] to focus on.     How long can you sit comfortably?  no limations    How long can you walk comfortably?  tolerates short trips to the store, unable to perform a lot of steps.     Patient Stated Goals  have a little more DN and decrease her pain so she can site see for her [place].      Currently in Pain?  Yes    Pain Score  2     Pain Location  Buttocks    Pain Orientation  Right    Pain Descriptors / Indicators  Dull;Sore    Pain Type  Chronic pain    Pain Onset  More than a month ago    Pain Frequency  Constant    Aggravating Factors   walking and stairs    Pain Relieving Factors  exercise gentle         [facility] PT Assessment - 09/03/17 0001      Assessment   Medical Diagnosis  Rt hip/ knee pain, LBP    Referring Provider  Dr Cy Blamer    Onset Date/Surgical Date  03/04/17    Hand Dominance  Right    Next MD Visit  01/04/2018    Prior Therapy  yes      Precautions   Precautions  None      Balance Screen   Has the patient fallen in the past 6 months  No      Prior Function   Level of  Independence  Independent    Vocation  Retired    Leisure  travel, read, exercise      Observation/Other Assessments   Focus on Therapeutic Outcomes (FOTO)   49% limited      Functional Tests   Functional tests  Squat      Squat   Comments  bilat hip adduction with knee pain      Posture/Postural Control   Posture/Postural Control  No significant limitations      ROM / Strength   AROM / PROM / Strength  AROM;Strength      AROM   AROM Assessment Site  Hip;Knee;Lumbar    Right/Left Hip  Left;Right    Right Hip External Rotation   19    Right Hip Internal Rotation   32    Right Hip ABduction  27    Left Hip External Rotation   45    Left Hip Internal Rotation   52    Left Hip ABduction  40    Lumbar Flexion  6" tight in her legs and back    Lumbar Extension  WNL pulling into Rt hip    Lumbar - Right Rotation  WNL    Lumbar - Left Rotation  WNL pulling into Rt hip      Strength   Strength Assessment Site  Hip;Knee;Ankle;Lumbar    Right/Left Hip  -- WNL    Right/Left Knee  -- WNL, excpet Rt hamstring 4/5    Right/Left Ankle  -- WNL    Lumbar Flexion  -- TA good      Flexibility   Soft Tissue Assessment /Muscle Length  yes    Hamstrings  supine  SLR Lt 56, Rt 65 supine SKTC 110 each side.     Quadriceps  bilat 137 with prone knee flex      Palpation   Spinal mobility  hypomobile in lumbar spine Rt > Lt     Palpation comment  tight and tender in rt lateral gluts, tight in Rt lumbar paraspinals.              Objective measurements completed on examination: See above findings.      [facility] Adult PT Treatment/Exercise - 09/03/17 0001      Self-Care   Self-Care  Other Self-Care Comments    Other Self-Care Comments   home TENS unit instruction - care of elctrodes and use of machine t oinclude all mods      Exercises   Exercises  Knee/Hip      Knee/Hip Exercises: Stretches   Active Hamstring Stretch  Both 45 sec with strap    Other Knee/Hip Stretches  cat/cow and childs pose       Modalities   Modalities  Electrical Stimulation;Moist Heat      Moist Heat Therapy   Number Minutes Moist Heat  15 Minutes    Moist Heat Location  Hip Rt and buttocks      Electrical Stimulation   Electrical Stimulation Location  Rt buttock/hip    Electrical Stimulation Action  WOrked through all 5 modes of TENs 7000    Electrical Stimulation Parameters  to tolerance    Electrical Stimulation Goals  Pain;Tone      Manual Therapy   Manual Therapy  Joint mobilization    Joint Mobilization  CPA and Rt UPA mobs L4-2 grade III, Rt hip              PT Education - 09/03/17 1125  Education provided  Yes    Education Details  HEP added in stetches    Person(s) Educated  Patient    Methods  Explanation;Handout    Comprehension  Returned demonstration;Verbalized understanding          PT Long Term Goals - Sep 22, 2017 1154      PT LONG TERM GOAL #1   Title  I with advanced HEP for lower body flexibility for new stretches ( 10/15/17)    Time  6    Period  Weeks    Status  New      PT LONG TERM GOAL #2   Title  increase Rt hip IR/ER to within 5 degrees of Lt ( 10/15/17)     Time  6    Period  Weeks    Status  New      PT  LONG TERM GOAL #3   Title  report =/> 75% reduction of Rt hip pain to allow her to manuever up/down multiple steps ( 10/15/17)     Time  6    Period  Weeks    Status  New      PT LONG TERM GOAL #4   Title  increase Rt lower body ROM to allow her to easily donn socks and shoes ( 10/15/17)     Time  6    Period  Weeks    Status  New      PT LONG TERM GOAL #5   Title  improve FOTO =/< 39% limited, CJ level (10/15/17)     Time  6    Period  Weeks    Status  New             Plan - 22-Sep-2017 1149    Clinical Impression Statement  76 yo female presents with order for multiple body parts however she states she is concerned about her Lt hip/leg/buttock.  She is hypomobile in the Rt hip and in the lumbar spine.  Does have osteopenia, tolerated gentle lumbar mobilizations.  Overall her strength is good in her lower body.  Has improped body mechinics with squats and functional tasks.  She would benefit from PT to improve her lumbar and hip motion as well as release muscular tightness in the Rt gluts and hamstrings.     Clinical Presentation  Stable    Clinical Decision Making  Low    Rehab Potential  Excellent    PT Frequency  1x / week    PT Duration  6 weeks    PT Treatment/Interventions  Iontophoresis 4mg /ml Dexamethasone;Gait training;Dry needling;Manual techniques;Moist Heat;Ultrasound;Therapeutic activities;Patient/family education;Cryotherapy;Electrical Stimulation;Passive range of motion    PT Next Visit Plan  mobilzations lumbar and Rt hip, DN PRN with modalities.     Consulted and Agree with Plan of Care  Patient       Patient will benefit from skilled therapeutic intervention in order to improve the following deficits and impairments:  Pain, Increased muscle spasms, Hypomobility, Decreased range of motion, Difficulty walking  Visit Diagnosis: Stiffness of right hip, not elsewhere classified - Plan: PT plan of care cert/re-cert  Pain in right hip - Plan: PT plan of care  cert/re-cert  Other muscle spasm - Plan: PT plan of care cert/re-cert  [facility] PT PB G-CODES - 09/22/17 Jan 06, 1206    Functional Assessment Tool Used   FOTO and professional judgement    Functional Limitations  Mobility: Walking and moving around    Mobility: Walking and Moving Around Current Status  At  least 40 percent but less than 60 percent impaired, limited or restricted    Mobility: Walking and Moving Around Goal Status [phone])  At least 20 percent but less than 40 percent impaired, limited or restricted        Problem List Patient Active Problem List   Diagnosis Date Noted  . Primary osteoarthritis of both hands 12/20/2016  . Primary osteoarthritis of both knees 12/20/2016  . Primary osteoarthritis of both feet 12/20/2016  . Hypothyroidism 09/05/2014  . Hyperlipidemia 09/05/2014  . Medicare annual wellness visit, subsequent 09/05/2014  . Lumbar spondylosis 07/07/2014  . 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 Visit for preventive health examination 08/31/2013  . Hair loss 01/20/2013  . Elevated MCV 07/22/2012  . Preventative health care 04/02/2011  . IRRITABLE BOWEL SYNDROME 07/10/2010  . GASTRITIS 07/04/2010  . RHINITIS 05/21/2010  . VERTIGO 05/21/2010  . VARICOSE VEINS, LOWER EXTREMITIES 11/13/2009  . OTHER DISEASES OF NASAL CAVITY AND SINUSES 01/31/2008  . HYPOTHYROIDISM 08/31/2007  . HYPERLIPIDEMIA 08/31/2007  . OTHER SPEC DISEASES BLOOD&BLOOD-FORMING ORGANS 08/31/2007  . ROSACEA 08/31/2007  . Osteoarthritis of right hip 08/31/2007  . OSTEOPENIA 08/31/2007    [name] PT  09/03/2017, 12:11 PM  [facility] [address] [place], [place], [address] Phone: [phone]   Fax:  [phone]  Name: Angela Burgess MRN: [id number] Date of Birth: 1940-11-29

## 2017-09-03 NOTE — Patient Instructions (Addendum)
Cat / Cow Flow    Inhale, press spine toward ceiling like a Halloween cat. Keeping strength in arms and abdominals, exhale to soften spine through neutral and into cow pose. Open chest and arch back. Initiate movement between cat and cow at tailbone, one vertebrae at a time. Repeat __10__ times.  Once day  BACK: Child's Pose (Sciatica)    Sit in knee-chest position and reach arms forward. Separate knees for comfort. Hold position for _30-45__ secs. Repeat _1__ times. Do __1_ times per day.  Supine: Leg Stretch with Strap (Super Advanced)    Lie on back with one leg straight. Hook strap around other foot. Straighten knee. Raise leg to maximal stretch and straighten knee further by tightening quadriceps. Slowly press other leg down as close to floor as possible. Keep lower abdominals tight. Hold _30-45__ seconds. Warning: Intense stretch. Stay within tolerance. Repeat _1__ times per session. Do _1__ sessions per day. Repeat on the other side.   TENS stands for Transcutaneous Electrical Nerve Stimulation. In other words, electrical impulses are allowed to pass through the skin in order to excite a nerve.   Purpose and Use of TENS:  TENS is a method used to manage acute and chronic pain without the use of drugs. It has been effective in managing pain associated with surgery, sprains, strains, trauma, rheumatoid arthritis, and neuralgias. It is a non-addictive, low risk, and non-invasive technique used to control pain. It is not, by any means, a curative form of treatment.   How TENS Works:  Most TENS units are a Paramedic unit powered by one 9 volt battery. Attached to the outside of the unit are two lead wires where two pins and/or snaps connect on each wire. All units come with a set of four reusable pads or electrodes. These are placed on the skin surrounding the area involved. By inserting the leads into  the pads, the electricity can pass from the unit making the circuit complete.    As the intensity is turned up slowly, the electrical current enters the body from the electrodes through the skin to the surrounding nerve fibers. This triggers the release of hormones from within the body. These hormones contain pain relievers. By increasing the circulation of these hormones, the person's pain may be lessened. It is also believed that the electrical stimulation itself helps to block the pain messages being sent to the brain, thus also decreasing the body's perception of pain.   Hazards:  TENS units are NOT to be used by patients with PACEMAKERS, DEFIBRILLATORS, DIABETIC PUMPS, PREGNANT WOMEN, and patients with SEIZURE DISORDERS.  TENS units are NOT to be used over the heart, throat, brain, or spinal cord.  One of the major side effects from the TENS unit may be skin irritation. Some people may develop a rash if they are sensitive to the materials used in the electrodes or the connecting wires.   Wear the unit for pain  Avoid overuse due the body getting used to the stem making it not as effective over time.

## 2017-09-09 ENCOUNTER — Encounter: Payer: Self-pay | Admitting: Physical Therapy

## 2017-09-09 ENCOUNTER — Ambulatory Visit: Payer: Medicare Other | Admitting: Physical Therapy

## 2017-09-09 DIAGNOSIS — M62838 Other muscle spasm: Secondary | ICD-10-CM | POA: Diagnosis not present

## 2017-09-09 DIAGNOSIS — M25651 Stiffness of right hip, not elsewhere classified: Secondary | ICD-10-CM | POA: Diagnosis not present

## 2017-09-09 DIAGNOSIS — M25551 Pain in right hip: Secondary | ICD-10-CM

## 2017-09-09 DIAGNOSIS — M25652 Stiffness of left hip, not elsewhere classified: Secondary | ICD-10-CM

## 2017-09-09 NOTE — Therapy (Signed)
[place], [place], [address] Phone: [phone]   Fax:  [phone]  Physical Therapy Treatment  Patient Details  Name: Angela Burgess MRN: [id number] Date of Birth: 12/23/1940 Referring Provider: Dr Cy Blamer   Encounter Date: 09/09/2017  PT End of Session - 09/09/17 1017    Visit Number  2    Number of Visits  6    Date for PT Re-Evaluation  10/15/17    PT Start Time  8592    PT Stop Time  1114    PT Time Calculation (min)  60 min    Activity Tolerance  Patient tolerated treatment well       Past Medical History:  Diagnosis Date  . Anemia   . Arthritis   . Cervical cancer ([place]) 1968  . Dexamethasone adverse reaction 2008  . Elevated MCV    for years  . Eye exam abnormal 6/09  . Family history of malignant neoplasm of gastrointestinal tract   . Gastritis   . History of mammogram 1/10  . History of normal resting EKG 2005  . Hyperlipidemia   . Hypothyroidism   . IBS (irritable bowel syndrome)   . Macrocytosis    with theme eval in past and normal b12  . Osteopenia   . Rectocele   . Rosacea   . Tetanus-diphtheria (Td) vaccination 1999    Past Surgical History:  Procedure Laterality Date  . ABDOMINAL HYSTERECTOMY    . APPENDECTOMY    . BREAST LUMPECTOMY    . CATARACT EXTRACTION, BILATERAL Bilateral    one in june and one in july; lens implant     There were no vitals filed for this visit.  Subjective Assessment - 09/09/17 1015    Subjective  Pt reports her hip felt a lot looser until yesterday. She did a lot of walking yesterday afternoon so that may be part of it.  She can feel the exercise working     Patient Stated Goals  have a little more DN and decrease her pain so she can site see for her [place].     Currently in Pain?  Yes    Pain Score  2     Pain Location  Buttocks    Pain Orientation  Right    Pain Descriptors / Indicators   Dull;Tightness    Pain Type  Chronic pain    Pain Onset  More than a month ago    Pain Frequency  Constant however not at tight feeling     Aggravating Factors   prolonged walking    Pain Relieving Factors  exercise/stretching                      [facility] Adult PT Treatment/Exercise - 09/09/17 0001      Knee/Hip Exercises: Stretches   Other Knee/Hip Stretches  in hooklying windshield wiper legs      Knee/Hip Exercises: Supine   Bridges  10 reps articulating      Modalities   Modalities  Electrical Stimulation;Moist Heat      Moist Heat Therapy   Number Minutes Moist Heat  15 Minutes    Moist Heat Location  Lumbar Spine;Hip      Electrical Stimulation   Electrical Stimulation Location  Rt buttock/hip    Electrical Stimulation Action  IFC    Electrical Stimulation Parameters   to tolerance    Electrical Stimulation Goals  Pain;Tone  Manual Therapy   Manual Therapy  Joint mobilization    Joint Mobilization  CPA and Rt UPA mobs L2-T9 grade III, Rt hip        Trigger Point Dry Needling - 09/09/17 1020    Consent Given?  Yes    Education Handout Provided  Yes    Muscles Treated Upper Body  Longissimus    Muscles Treated Lower Body  Gluteus maximus    Longissimus Response  Palpable increased muscle length;Twitch response elicited Y7-8 Rt     Gluteus Maximus Response  Palpable increased muscle length;Twitch response elicited Rt                 PT Long Term Goals - 09/09/17 1059      PT LONG TERM GOAL #1   Title  I with advanced HEP for lower body flexibility for new stretches ( 10/15/17)    Status  On-going      PT LONG TERM GOAL #2   Title  increase Rt hip IR/ER to within 5 degrees of Lt ( 10/15/17)     Status  On-going      PT LONG TERM GOAL #3   Title  report =/> 75% reduction of Rt hip pain to allow her to manuever up/down multiple steps ( 10/15/17)     Status  On-going      PT LONG TERM GOAL #4   Title  increase Rt lower body ROM to  allow her to easily donn socks and shoes ( 10/15/17)     Status  On-going      PT LONG TERM GOAL #5   Title  improve FOTO =/< 39% limited, CJ level (10/15/17)     Status  On-going            Plan - 09/09/17 1055    Clinical Impression Statement  This is Angela Burgess second visit, she had good results after her first tx, responding well to gentle spinal mobs and hip mobs. This was completed again today with Dry needling.  No goals met yet.  Will continue with mobs and DN PRN to increase mobility and decrease feelings of tightness.     Rehab Potential  Excellent    PT Frequency  1x / week    PT Duration  6 weeks    PT Treatment/Interventions  Iontophoresis 91m/ml Dexamethasone;Gait training;Dry needling;Manual techniques;Moist Heat;Ultrasound;Therapeutic activities;Patient/family education;Cryotherapy;Electrical Stimulation;Passive range of motion    PT Next Visit Plan  mobilzations lumbar and Rt hip, DN PRN with modalities.     Consulted and Agree with Plan of Care  Patient       Patient will benefit from skilled therapeutic intervention in order to improve the following deficits and impairments:  Pain, Increased muscle spasms, Hypomobility, Decreased range of motion, Difficulty walking  Visit Diagnosis: Stiffness of right hip, not elsewhere classified  Pain in right hip  Other muscle spasm  Stiffness of left hip, not elsewhere classified     Problem List Patient Active Problem List   Diagnosis Date Noted  . Primary osteoarthritis of both hands 12/20/2016  . Primary osteoarthritis of both knees 12/20/2016  . Primary osteoarthritis of both feet 12/20/2016  . Hypothyroidism 09/05/2014  . Hyperlipidemia 09/05/2014  . Medicare annual wellness visit, subsequent 09/05/2014  . Lumbar spondylosis 07/07/2014  . 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 Visit for preventive health examination 08/31/2013  . Hair loss 01/20/2013  . Elevated MCV 07/22/2012  . Preventative health care 04/02/2011  .  IRRITABLE BOWEL SYNDROME 07/10/2010  .  GASTRITIS 07/04/2010  . RHINITIS 05/21/2010  . VERTIGO 05/21/2010  . VARICOSE VEINS, LOWER EXTREMITIES 11/13/2009  . OTHER DISEASES OF NASAL CAVITY AND SINUSES 01/31/2008  . HYPOTHYROIDISM 08/31/2007  . HYPERLIPIDEMIA 08/31/2007  . OTHER SPEC DISEASES BLOOD&BLOOD-FORMING ORGANS 08/31/2007  . ROSACEA 08/31/2007  . Osteoarthritis of right hip 08/31/2007  . OSTEOPENIA 08/31/2007    Boneta Lucks rPT  09/09/2017, 11:00 AM  [facility] [place], [place], [address] Phone: [phone]   Fax:  [phone]  Name: Angela Burgess MRN: [id number] Date of Birth: [birth year]

## 2017-09-16 ENCOUNTER — Encounter: Payer: Medicare Other | Admitting: Physical Therapy

## 2017-09-23 ENCOUNTER — Encounter: Payer: Self-pay | Admitting: Physical Therapy

## 2017-09-23 ENCOUNTER — Ambulatory Visit (INDEPENDENT_AMBULATORY_CARE_PROVIDER_SITE_OTHER): Payer: Medicare Other | Admitting: Physical Therapy

## 2017-09-23 DIAGNOSIS — M25652 Stiffness of left hip, not elsewhere classified: Secondary | ICD-10-CM

## 2017-09-23 DIAGNOSIS — M25651 Stiffness of right hip, not elsewhere classified: Secondary | ICD-10-CM | POA: Diagnosis not present

## 2017-09-23 DIAGNOSIS — M25551 Pain in right hip: Secondary | ICD-10-CM | POA: Diagnosis not present

## 2017-09-23 DIAGNOSIS — M62838 Other muscle spasm: Secondary | ICD-10-CM

## 2017-09-23 NOTE — Therapy (Signed)
[place], [place], [address] Phone: [phone]   Fax:  [phone]  Physical Therapy Treatment  Patient Details  Name: Angela Burgess MRN: [id number] Date of Birth: Sep 29, 1940 Referring Provider: Dr Cy Blamer   Encounter Date: 09/23/2017  PT End of Session - 09/23/17 1021    Visit Number  3    Number of Visits  6    Date for PT Re-Evaluation  10/15/17    PT Start Time  1021    PT Stop Time  1117    PT Time Calculation (min)  56 min    Activity Tolerance  Patient tolerated treatment well       Past Medical History:  Diagnosis Date  . Anemia   . Arthritis   . Cervical cancer ([place]) 1968  . Dexamethasone adverse reaction 2008  . Elevated MCV    for years  . Eye exam abnormal 6/09  . Family history of malignant neoplasm of gastrointestinal tract   . Gastritis   . History of mammogram 1/10  . History of normal resting EKG 2005  . Hyperlipidemia   . Hypothyroidism   . IBS (irritable bowel syndrome)   . Macrocytosis    with theme eval in past and normal b12  . Osteopenia   . Rectocele   . Rosacea   . Tetanus-diphtheria (Td) vaccination 1999    Past Surgical History:  Procedure Laterality Date  . ABDOMINAL HYSTERECTOMY    . APPENDECTOMY    . BREAST LUMPECTOMY    . CATARACT EXTRACTION, BILATERAL Bilateral    one in june and one in july; lens implant     There were no vitals filed for this visit.  Subjective Assessment - 09/23/17 1023    Subjective  Signa reports she is feeling pretty good, doing her HEP. 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  Her back is much looser, able to pick up items easier.     Patient Stated Goals  have a little more DN and decrease her pain so she can site see for her [place].     Currently in Pain?  Yes    Pain Score  -- only with stepping the wrong way.     Pain Location  Groin    Pain  Orientation  Left;Right    Pain Frequency  Intermittent    Aggravating Factors   stepping the wrong way    Pain Relieving Factors  stretches except windshield wiper          [facility] PT Assessment - 09/23/17 0001      Assessment   Medical Diagnosis  bilat hand and knee pain, LBP                  [facility] Adult PT Treatment/Exercise - 09/23/17 0001      Knee/Hip Exercises: Stretches   Passive Hamstring Stretch  Right    Quad Stretch  Right    Hip Flexor Stretch  Right;60 seconds    Other Knee/Hip Stretches  butterfly stretch 60sec,       Modalities   Modalities  Electrical Stimulation;Moist Heat      Moist Heat Therapy   Number Minutes Moist Heat  20 Minutes    Moist Heat Location  -- Rt hip adductors and groin      Electrical Stimulation   Electrical Stimulation Location  Rt hip adductors and groin  Electrical Stimulation Action  IFC     Electrical Stimulation Parameters  to tolerance    Electrical Stimulation Goals  Pain;Tone      Manual Therapy   Manual Therapy  Soft tissue mobilization;Manual Traction    Soft tissue mobilization  STM to Rt LE hip adductors and iliacis     Manual Traction  Rt LE with stretching into abuction       Trigger Point Dry Needling - 09/23/17 1030    Consent Given?  Yes    Education Handout Provided  No    Muscles Treated Lower Body  Adductor longus/brevius/maximus iliacus Rt side with good releases    Adductor Response  Palpable increased muscle length;Twitch response elicited Rt                 PT Long Term Goals - 09/23/17 1055      PT LONG TERM GOAL #1   Title  I with advanced HEP for lower body flexibility for new stretches ( 10/15/17)    Status  Achieved      PT LONG TERM GOAL #2   Title  increase Rt hip IR/ER to within 5 degrees of Lt ( 10/15/17)     Status  On-going      PT LONG TERM GOAL #3   Title  report =/> 75% reduction of Rt hip pain to allow her to manuever up/down multiple steps ( 10/15/17)      Status  On-going      PT LONG TERM GOAL #4   Title  increase Rt lower body ROM to allow her to easily donn socks and shoes ( 10/15/17)     Status  Achieved      PT LONG TERM GOAL #5   Title  improve FOTO =/< 39% limited, CJ level (10/15/17)     Status  On-going            Plan - 09/23/17 1056    Clinical Impression Statement  Angela Burgess had some great relief after todays session.  She continues to report improvement in mobility and function.  Back and outside of Rt hip are almost resolved, symptoms have been more persistant into the Rt groin lately. Goals partially met    Rehab Potential  Excellent    PT Frequency  1x / week    PT Duration  6 weeks    PT Treatment/Interventions  Iontophoresis 37m/ml Dexamethasone;Gait training;Dry needling;Manual techniques;Moist Heat;Ultrasound;Therapeutic activities;Patient/family education;Cryotherapy;Electrical Stimulation;Passive range of motion    PT Next Visit Plan  assess response to adductor manual work.     Consulted and Agree with Plan of Care  Patient       Patient will benefit from skilled therapeutic intervention in order to improve the following deficits and impairments:  Pain, Increased muscle spasms, Hypomobility, Decreased range of motion, Difficulty walking  Visit Diagnosis: Stiffness of right hip, not elsewhere classified  Pain in right hip  Other muscle spasm  Stiffness of left hip, not elsewhere classified     Problem List Patient Active Problem List   Diagnosis Date Noted  . Primary osteoarthritis of both hands 12/20/2016  . Primary osteoarthritis of both knees 12/20/2016  . Primary osteoarthritis of both feet 12/20/2016  . Hypothyroidism 09/05/2014  . Hyperlipidemia 09/05/2014  . Medicare annual wellness visit, subsequent 09/05/2014  . Lumbar spondylosis 07/07/2014  . 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 Visit for preventive health examination 08/31/2013  . Hair loss 01/20/2013  . Elevated MCV 07/22/2012  .  Preventative health  care 04/02/2011  . IRRITABLE BOWEL SYNDROME 07/10/2010  . GASTRITIS 07/04/2010  . RHINITIS 05/21/2010  . VERTIGO 05/21/2010  . VARICOSE VEINS, LOWER EXTREMITIES 11/13/2009  . OTHER DISEASES OF NASAL CAVITY AND SINUSES 01/31/2008  . HYPOTHYROIDISM 08/31/2007  . HYPERLIPIDEMIA 08/31/2007  . OTHER SPEC DISEASES BLOOD&BLOOD-FORMING ORGANS 08/31/2007  . ROSACEA 08/31/2007  . Osteoarthritis of right hip 08/31/2007  . OSTEOPENIA 08/31/2007    [name] PT 09/23/2017, 11:02 AM  [facility] [place], [place], [address] Phone: [phone]   Fax:  [phone]  Name: Angela Burgess MRN: [id number] Date of Birth: 12-Oct-1940

## 2017-09-29 ENCOUNTER — Encounter: Payer: Self-pay | Admitting: Physical Therapy

## 2017-09-29 ENCOUNTER — Ambulatory Visit: Payer: Medicare Other | Admitting: Physical Therapy

## 2017-09-29 DIAGNOSIS — M25551 Pain in right hip: Secondary | ICD-10-CM

## 2017-09-29 DIAGNOSIS — M62838 Other muscle spasm: Secondary | ICD-10-CM

## 2017-09-29 DIAGNOSIS — M25651 Stiffness of right hip, not elsewhere classified: Secondary | ICD-10-CM | POA: Diagnosis not present

## 2017-09-29 NOTE — Therapy (Signed)
[place], [place], [address] Phone: [phone]   Fax:  [phone]  Physical Therapy Treatment  Patient Details  Name: Angela Burgess MRN: [id number] Date of Birth: 1941/02/20 Referring Provider: Dr Cy Blamer   Encounter Date: 09/29/2017  PT End of Session - 09/29/17 1022    Visit Number  4    Number of Visits  6    Date for PT Re-Evaluation  10/15/17    PT Start Time  2694    PT Stop Time  1123    PT Time Calculation (min)  60 min    Activity Tolerance  Patient tolerated treatment well       Past Medical History:  Diagnosis Date  . Anemia   . Arthritis   . Cervical cancer ([place]) 1968  . Dexamethasone adverse reaction 2008  . Elevated MCV    for years  . Eye exam abnormal 6/09  . Family history of malignant neoplasm of gastrointestinal tract   . Gastritis   . History of mammogram 1/10  . History of normal resting EKG 2005  . Hyperlipidemia   . Hypothyroidism   . IBS (irritable bowel syndrome)   . Macrocytosis    with theme eval in past and normal b12  . Osteopenia   . Rectocele   . Rosacea   . Tetanus-diphtheria (Td) vaccination 1999    Past Surgical History:  Procedure Laterality Date  . ABDOMINAL HYSTERECTOMY    . APPENDECTOMY    . BREAST LUMPECTOMY    . CATARACT EXTRACTION, BILATERAL Bilateral    one in june and one in july; lens implant     There were no vitals filed for this visit.  Subjective Assessment - 09/29/17 1024    Subjective  [name] reports she has been sore, the same.  Has not added windshield exercise back in.  She feels like the nerve on the outside of her Rt leg is acting up     Currently in Pain?  Yes    Pain Score  4     Pain Location  Groin    Pain Orientation  Right    Pain Descriptors / Indicators  Dull    Pain Type  Chronic pain    Pain Radiating Towards  to the Rt ankle     Pain Onset  More than a month ago    Pain Frequency  Constant     Aggravating Factors   not sure, prolonged sitting    Pain Relieving Factors  gentle movement         [facility] PT Assessment - 09/29/17 0001      Assessment   Medical Diagnosis  bilat hand and knee pain, LBP      AROM   Right Hip External Rotation   24    Right Hip Internal Rotation   42    Right Hip ABduction  32    Left Hip External Rotation   45    Left Hip Internal Rotation   50    Lumbar Flexion  4" from the floor    Lumbar - Right Rotation  WNL    Lumbar - Left Rotation  WNL                  [facility] Adult PT Treatment/Exercise - 09/29/17 0001      Knee/Hip Exercises: Stretches   Quad Stretch  Right    Hip Flexor Stretch  Right;60 seconds  Other Knee/Hip Stretches  butterfly stretch 60sec,       Knee/Hip Exercises: Aerobic   Stationary Bike  L2x5'      Knee/Hip Exercises: Sidelying   Other Sidelying Knee/Hip Exercises  q      Modalities   Modalities  Electrical Stimulation;Moist Heat      Moist Heat Therapy   Number Minutes Moist Heat  15 Minutes    Moist Heat Location  Lumbar Spine;Hip      Electrical Stimulation   Electrical Stimulation Location  Rt hip adductors and groin and quad    Electrical Stimulation Action  IFC    Electrical Stimulation Parameters  to tolerance    Electrical Stimulation Goals  Pain;Tone      Manual Therapy   Manual Therapy  Soft tissue mobilization;Manual Traction    Soft tissue mobilization  STM to Rt LE hip adductors and iliacis     Manual Traction  Rt LE with stretching into abuction       Trigger Point Dry Needling - 09/29/17 1104    Consent Given?  Yes    Education Handout Provided  No    Muscles Treated Lower Body  Tensor fascia lata;Quadriceps;Adductor longus/brevius/maximus all Rt side    Tensor Fascia Lata Response  Palpable increased muscle length;Twitch response elicited rt    Quadriceps Response  Palpable increased muscle length;Twitch response elicited rt, rectus femoris and vastus lateralis     Adductor Response  Palpable increased muscle length;Twitch response elicited rt                PT Long Term Goals - 09/23/17 1055      PT LONG TERM GOAL #1   Title  I with advanced HEP for lower body flexibility for new stretches ( 10/15/17)    Status  Achieved      PT LONG TERM GOAL #2   Title  increase Rt hip IR/ER to within 5 degrees of Lt ( 10/15/17)     Status  On-going      PT LONG TERM GOAL #3   Title  report =/> 75% reduction of Rt hip pain to allow her to manuever up/down multiple steps ( 10/15/17)     Status  On-going      PT LONG TERM GOAL #4   Title  increase Rt lower body ROM to allow her to easily donn socks and shoes ( 10/15/17)     Status  Achieved      PT LONG TERM GOAL #5   Title  improve FOTO =/< 39% limited, CJ level (10/15/17)     Status  On-going            Plan - 09/29/17 1117    Clinical Impression Statement  Angela Burgess has shown continued progress with increased motion in the Rt hip and decreased fellings of tightness.  Continues to progress to goals. Would ebenefit from continued treatment to restrore hip ROM to WNl and decrease pain further.     Rehab Potential  Excellent    PT Frequency  1x / week    PT Duration  6 weeks    PT Treatment/Interventions  Iontophoresis 4mg /ml Dexamethasone;Gait training;Dry needling;Manual techniques;Moist Heat;Ultrasound;Therapeutic activities;Patient/family education;Cryotherapy;Electrical Stimulation;Passive range of motion    PT Next Visit Plan  assess response to adductor and quad manual work.     Consulted and Agree with Plan of Care  Patient       Patient will benefit from skilled therapeutic intervention in order to improve the  following deficits and impairments:  Pain, Increased muscle spasms, Hypomobility, Decreased range of motion, Difficulty walking  Visit Diagnosis: Stiffness of right hip, not elsewhere classified  Pain in right hip  Other muscle spasm     Problem List Patient Active Problem  List   Diagnosis Date Noted  . Primary osteoarthritis of both hands 12/20/2016  . Primary osteoarthritis of both knees 12/20/2016  . Primary osteoarthritis of both feet 12/20/2016  . Hypothyroidism 09/05/2014  . Hyperlipidemia 09/05/2014  . Medicare annual wellness visit, subsequent 09/05/2014  . Lumbar spondylosis 07/07/2014  . 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 Visit for preventive health examination 08/31/2013  . Hair loss 01/20/2013  . Elevated MCV 07/22/2012  . Preventative health care 04/02/2011  . IRRITABLE BOWEL SYNDROME 07/10/2010  . GASTRITIS 07/04/2010  . RHINITIS 05/21/2010  . VERTIGO 05/21/2010  . VARICOSE VEINS, LOWER EXTREMITIES 11/13/2009  . OTHER DISEASES OF NASAL CAVITY AND SINUSES 01/31/2008  . HYPOTHYROIDISM 08/31/2007  . HYPERLIPIDEMIA 08/31/2007  . OTHER SPEC DISEASES BLOOD&BLOOD-FORMING ORGANS 08/31/2007  . ROSACEA 08/31/2007  . Osteoarthritis of right hip 08/31/2007  . OSTEOPENIA 08/31/2007    [name] PT  09/29/2017, 11:19 AM  [facility] [place], [place], [address] Phone: [phone]   Fax:  [phone]  Name: Angela Burgess MRN: [id number] Date of Birth: 1940/11/23

## 2017-10-05 NOTE — Progress Notes (Signed)
Chief Complaint  Patient presents with  . Annual Exam    no new concerns    HPI: CHIRSTINA Burgess 77 y.o. comes in today for Preventive Medicare exam/ and  Med management    Thyroid   Takes  Every  Day .   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 TETANUS/TDAP  11/14/2019  . INFLUENZA VACCINE  Completed  . DEXA SCAN  Completed  . PNA vac Low Risk Adult  Completed   Health Maintenance Review LIFESTYLE:  Exercise:  q d   Stretches and bike piliates.  Tobacco/ETS: no Alcohol:  no Sugar beverages: no Sleep: 9- 10  Drug use: no HH:   2 no pets    Hearing: ok   Vision:  No limitations at present . Last eye check UTD sp cataract surgery   Safety:  Has smoke detector and wears seat belts.  Safel;uy stored firearms. No excess sun exposure. Sees dentist regularly.  Falls:  Takes tai chi   Memory: Felt to be good  , no concern from her or her family.  Depression: No anhedonia unusual crying or depressive symptoms  Nutrition: Eats well balanced diet; adequate calcium and vitamin D. No swallowing chewing problems.  Injury: no major injuries in the last six months.  Other healthcare providers:  Reviewed today .  Social:  Lives with spouse married. No pets.   Preventive parameters: up-to-date  Reviewed   ADLS:   There are no problems or need for assistance  driving, feeding, obtaining food, dressing, toileting and bathing, managing money using phone. She is independent.   ROS:  GEN/ HEENT: No fever, significant weight changes sweats headaches vision problems hearing changes, CV/ PULM; No chest pain shortness of breath cough,  syncope,edema  change in exercise tolerance. GI /GU: No adominal pain, vomiting, change in bowel habits. No blood in the stool. No significant GU symptoms. SKIN/HEME: ,no acute skin rashes suspicious lesions or bleeding. No lymphadenopathy, nodules, masses.  NEURO/ PSYCH:  No neurologic signs such as weakness numbness. No depression anxiety. IMM/ Allergy: No unusual infections.  Allergy .   REST of 12 system review negative except as per HPI   Past Medical History:  Diagnosis Date  . Anemia   . Arthritis   . Cervical cancer ([place]) 1968  . Dexamethasone adverse reaction 2008  . Elevated MCV    for years  . Eye exam abnormal 6/09  . Family history of malignant neoplasm of gastrointestinal tract   . Gastritis   . History of mammogram 1/10  . History of normal resting EKG 2005  . Hyperlipidemia   . Hypothyroidism   . IBS (irritable bowel syndrome)   . Macrocytosis    with theme eval in past and normal b12  . Osteopenia   . Rectocele   . Rosacea   . Tetanus-diphtheria (Td) vaccination 1999    Family History  Problem Relation Age of Onset  . Cirrhosis Mother   . Kidney disease  Mother   . Allergy (severe) Mother        protein allergy that sent posion to her brain  . Leukemia Father   . Heart attack Father 92  . Diabetes Father   . Bipolar disorder Sister   . Stroke Sister   . Lung cancer Brother        smoker  . Seizures Brother   . Bipolar disorder Son   . Colon cancer Brother   . Seizures Sister   . Seizures Sister     Social History   Socioeconomic History  . Marital status: Married    Spouse name: None  . Number of children: 2  . Years of education: None  . Highest education level: None  Social Needs  . Financial resource strain: None  . Food insecurity - worry: None  . Food insecurity - inability: None  . Transportation needs - medical: None  . Transportation needs - non-medical: None  Occupational History  . Occupation: retired  Tobacco Use  .  Smoking status: Never Smoker  . Smokeless tobacco: Never Used  . Tobacco comment: never used tobacco  Substance and Sexual Activity  . Alcohol use: No    Alcohol/week: 0.0 oz    Comment: none now   . Drug use: No  . Sexual activity: None  Other Topics Concern  . 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 aspirin 81 MG tablet Take 81 mg by mouth daily.  . Astaxanthin 4 MG CAPS Take 1 capsule by mouth 2 (two) times daily.  . Calcium Carbonate-Vitamin D (CALCIUM 500/D) 500-125 MG-UNIT TABS Take by mouth.    . cetirizine (ZYRTEC) 10 MG tablet Take by mouth.  . Cholecalciferol (VITAMIN D3) 2000 UNITS TABS Take by mouth every morning.  . diclofenac sodium (VOLTAREN) 1 % GEL 3 grams to 3 large joints up to three times a day  . estradiol (ESTRACE) 0.1 MG/GM vaginal cream Place 1 Applicatorful vaginally 2 (two) times a week.   . fish oil-omega-3 fatty acids 1000 MG capsule Take 2 g by mouth daily.    [name] ketoconazole (NIZORAL) 2 % cream APP EXT AA OF FUNGUS BID PRN  . levothyroxine (SYNTHROID, LEVOTHROID) 112 MCG tablet TAKE 1 TABLET BY MOUTH EVERY DAY  . metroNIDAZOLE (METROCREAM) 0.75 % cream Apply 1 application topically daily.    . Multiple Vitamin (MULTIVITAMIN) tablet Take 1 tablet by mouth daily.    [name] triamcinolone cream (KENALOG) 0.1 % APP AA OF FOOT BID PRN FOR ITCHING   No facility-administered encounter medications on file as of 10/06/2017.     EXAM:  BP 118/62 (BP Location: Right Arm, Patient Position: Sitting, Cuff Size: Normal)   Pulse (!) 59   Temp 97.7 F (36.5 C) (Oral)   Ht 5' 4"  (1.626 m)   Wt 128 lb 3.2 oz (58.2 kg)   BMI 22.01 kg/m   Body mass index is 22.01 kg/m.  Physical Exam: Vital signs reviewed UXY:BFXO is a well-developed well-nourished alert cooperative   who appears stated age in no acute distress.  HEENT:  normocephalic atraumatic , Eyes: PERRL EOM's full, conjunctiva clear, Nares: paten,t no deformity discharge or tenderness., Ears: no deformity EAC's clear TMs with normal landmarks. Mouth: clear OP, no lesions, edema.  Moist mucous membranes. Dentition in  adequate repair. NECK: supple without masses, thyromegaly or bruits. CHEST/PULM:  Clear to auscultation and percussion breath sounds equal no wheeze , rales or rhonchi. No chest wall deformities or tenderness.mild kyphosis Breast: normal by inspection . No dimpling, discharge, masses, tenderness or discharge . CV: PMI is nondisplaced, S1 S2 no gallops, murmurs, rubs. Peripheral pulses are full without delay.No JVD .  ABDOMEN: Bowel sounds normal nontender  No guard or rebound, no hepato splenomegal no CVA tenderness.   No bruits heard  Extremtities:  No clubbing cyanosis or edema, no acute joint swelling or redness no focal atrophy NEURO:  Oriented x3, cranial nerves 3-12 appear to be intact, no obvious focal weakness,gait within normal limits no abnormal reflexes or asymmetrical SKIN: No acute rashes normal turgor, color, no bruising or petechiae. PSYCH: Oriented, good eye contact, no obvious depression anxiety, cognition and judgment appear normal. LN: no cervical axillary inguinal adenopathy No noted deficits in memory, attention, and speech.   Lab Results  Component Value Date   WBC 4.1 08/24/2017   HGB 12.1 08/24/2017   HCT 34.9 08/24/2017   PLT 226 08/24/2017   GLUCOSE 99 08/24/2017   CHOL 246 (H) 09/08/2016   TRIG 116.0 09/08/2016   HDL 63.30 09/08/2016   LDLDIRECT 165.2 07/21/2012   LDLCALC 159 (H) 09/08/2016   ALT 18 08/24/2017   AST 22 08/24/2017   NA 145 08/24/2017   K 3.9 08/24/2017   CL 102 08/24/2017   CREATININE 1.1 08/24/2017   BUN 16 08/24/2017   CO2 31 08/24/2017   TSH 1.90 09/08/2016   HGBA1C 5.1 09/18/2015    ASSESSMENT AND PLAN:  Discussed the following assessment and plan:  Visit for preventive health  examination  Hyperlipidemia, unspecified hyperlipidemia type - Plan: Lipid panel, TSH  Hypothyroidism, unspecified type - Plan: Lipid panel, TSH  Medication management - Plan: Lipid panel, TSH Lipids and tsh  Due   Refill meds as appropriate    No indication for  abd [place] aaa Update  Specialty team Had dexa  At dr Gaetano Net  Patient Care Team: Burnis Medin, MD as PCP - General Lafayette Dragon, MD (Inactive) (Gastroenterology) Everlene Farrier, MD as Attending Physician (Obstetrics and Gynecology) Bo Merino, MD (Rheumatology) inman   savage Marin Olp Rudell Cobb, MD as Consulting Physician (Oncology)  Patient Instructions  Get [place] a copy of recent bone density for the EHR    Will notify you  of labs when available.   Glad you are doing well   cologuard  But colon not due until 2021.    Preventive Care 41 Years and Older, Female Preventive care refers to lifestyle choices and visits with your health care provider that can promote health and wellness. What does preventive care include?  A yearly physical exam. This is also called an annual well check.  Dental exams once or twice a year.  Routine eye exams. Ask your health care provider how often you should have your eyes checked.  Personal lifestyle choices, including: ? Daily care of your teeth and gums. ? Regular physical activity. ? Eating a healthy diet. ? Avoiding tobacco and drug use. ? Limiting alcohol use. ? Practicing safe sex. ? Taking low-dose aspirin every day. ? Taking vitamin and mineral supplements as recommended by your health care provider. What happens during an annual well check? The services and screenings done by your health care provider during your annual well check will depend on your age, overall health, lifestyle risk factors, and family history  of disease. Counseling Your health care provider may ask you questions about your:  Alcohol use.  Tobacco use.  Drug use.  Emotional  well-being.  Home and relationship well-being.  Sexual activity.  Eating habits.  History of falls.  Memory and ability to understand (cognition).  Work and work Statistician.  Reproductive health.  Screening You may have the following tests or measurements:  Height, weight, and BMI.  Blood pressure.  Lipid and cholesterol levels. These may be checked every 5 years, or more frequently if you are over 11 years old.  Skin check.  Lung cancer screening. You may have this screening every year starting at age 76 if you have a 30-pack-year history of smoking and currently smoke or have quit within the past 15 years.  Fecal occult blood test (FOBT) of the stool. You may have this test every year starting at age 102.  Flexible sigmoidoscopy or colonoscopy. You may have a sigmoidoscopy every 5 years or a colonoscopy every 10 years starting at age 56.  Hepatitis C blood test.  Hepatitis B blood test.  Sexually transmitted disease (STD) testing.  Diabetes screening. This is done by checking your blood sugar (glucose) after you have not eaten for a while (fasting). You may have this done every 1-3 years.  Bone density scan. This is done to screen for osteoporosis. You may have this done starting at age 77.  Mammogram. This may be done every 1-2 years. Talk to your health care provider about how often you should have regular mammograms.  Talk with your health care provider about your test results, treatment options, and if necessary, the need for more tests. Vaccines Your health care provider may recommend certain vaccines, such as:  Influenza vaccine. This is recommended every year.  Tetanus, diphtheria, and acellular pertussis (Tdap, Td) vaccine. You may need a Td booster every 10 years.  Varicella vaccine. You may need this if you have not been vaccinated.  Zoster vaccine. You may need this after age 60.  Measles, mumps, and rubella (MMR) vaccine. You may need at least  one dose of MMR if you were born in 1957 or later. You may also need a second dose.  Pneumococcal 13-valent conjugate (PCV13) vaccine. One dose is recommended after age 68.  Pneumococcal polysaccharide (PPSV23) vaccine. One dose is recommended after age 60.  Meningococcal vaccine. You may need this if you have certain conditions.  Hepatitis A vaccine. You may need this if you have certain conditions or if you travel or work in places where you may be exposed to hepatitis A.  Hepatitis B vaccine. You may need this if you have certain conditions or if you travel or work in places where you may be exposed to hepatitis B.  Haemophilus influenzae type b (Hib) vaccine. You may need this if you have certain conditions.  Talk to your health care provider about which screenings and vaccines you need and how often you need them. This information is not intended to replace advice given to you by your health care provider. Make sure you discuss any questions you have with your health care provider. Document Released: 10/05/2015 Document Revised: 05/28/2016 Document Reviewed: 07/10/2015 Elsevier Interactive Patient Education  2018 [place]. Zayra Devito M.D.

## 2017-10-06 ENCOUNTER — Ambulatory Visit (INDEPENDENT_AMBULATORY_CARE_PROVIDER_SITE_OTHER): Payer: Medicare Other | Admitting: Internal Medicine

## 2017-10-06 ENCOUNTER — Encounter: Payer: Self-pay | Admitting: Internal Medicine

## 2017-10-06 VITALS — BP 118/62 | HR 59 | Temp 97.7°F | Ht 64.0 in | Wt 128.2 lb

## 2017-10-06 DIAGNOSIS — Z79899 Other long term (current) drug therapy: Secondary | ICD-10-CM | POA: Diagnosis not present

## 2017-10-06 DIAGNOSIS — E785 Hyperlipidemia, unspecified: Secondary | ICD-10-CM | POA: Diagnosis not present

## 2017-10-06 DIAGNOSIS — E039 Hypothyroidism, unspecified: Secondary | ICD-10-CM | POA: Diagnosis not present

## 2017-10-06 DIAGNOSIS — Z0000 Encounter for general adult medical examination without abnormal findings: Secondary | ICD-10-CM

## 2017-10-06 LAB — TSH: TSH: 1.36 u[IU]/mL (ref 0.35–4.50)

## 2017-10-06 LAB — LIPID PANEL
CHOL/HDL RATIO: 4
Cholesterol: 244 mg/dL — ABNORMAL HIGH (ref 0–200)
HDL: 64.6 mg/dL (ref >39.00)
LDL Cholesterol: 163 mg/dL — ABNORMAL HIGH (ref 0–99)
NONHDL: 179.16
Triglycerides: 82 mg/dL (ref 0.0–149.0)
VLDL: 16.4 mg/dL (ref 0.0–40.0)

## 2017-10-06 NOTE — Patient Instructions (Addendum)
Get [place] a copy of recent bone density for the EHR    Will notify you  of labs when available.   Glad you are doing well   cologuard  But colon not due until 2021.    Preventive Care 19 Years and Older, Female Preventive care refers to lifestyle choices and visits with your health care provider that can promote health and wellness. What does preventive care include?  A yearly physical exam. This is also called an annual well check.  Dental exams once or twice a year.  Routine eye exams. Ask your health care provider how often you should have your eyes checked.  Personal lifestyle choices, including: ? Daily care of your teeth and gums. ? Regular physical activity. ? Eating a healthy diet. ? Avoiding tobacco and drug use. ? Limiting alcohol use. ? Practicing safe sex. ? Taking low-dose aspirin every day. ? Taking vitamin and mineral supplements as recommended by your health care provider. What happens during an annual well check? The services and screenings done by your health care provider during your annual well check will depend on your age, overall health, lifestyle risk factors, and family history of disease. Counseling Your health care provider may ask you questions about your:  Alcohol use.  Tobacco use.  Drug use.  Emotional well-being.  Home and relationship well-being.  Sexual activity.  Eating habits.  History of falls.  Memory and ability to understand (cognition).  Work and work Statistician.  Reproductive health.  Screening You may have the following tests or measurements:  Height, weight, and BMI.  Blood pressure.  Lipid and cholesterol levels. These may be checked every 5 years, or more frequently if you are over 68 years old.  Skin check.  Lung cancer screening. You may have this screening every year starting at age 12 if you have a 30-pack-year history of smoking and currently smoke or have quit within the past 15 years.  Fecal  occult blood test (FOBT) of the stool. You may have this test every year starting at age 35.  Flexible sigmoidoscopy or colonoscopy. You may have a sigmoidoscopy every 5 years or a colonoscopy every 10 years starting at age 71.  Hepatitis C blood test.  Hepatitis B blood test.  Sexually transmitted disease (STD) testing.  Diabetes screening. This is done by checking your blood sugar (glucose) after you have not eaten for a while (fasting). You may have this done every 1-3 years.  Bone density scan. This is done to screen for osteoporosis. You may have this done starting at age 34.  Mammogram. This may be done every 1-2 years. Talk to your health care provider about how often you should have regular mammograms.  Talk with your health care provider about your test results, treatment options, and if necessary, the need for more tests. Vaccines Your health care provider may recommend certain vaccines, such as:  Influenza vaccine. This is recommended every year.  Tetanus, diphtheria, and acellular pertussis (Tdap, Td) vaccine. You may need a Td booster every 10 years.  Varicella vaccine. You may need this if you have not been vaccinated.  Zoster vaccine. You may need this after age 55.  Measles, mumps, and rubella (MMR) vaccine. You may need at least one dose of MMR if you were born in 1957 or later. You may also need a second dose.  Pneumococcal 13-valent conjugate (PCV13) vaccine. One dose is recommended after age 74.  Pneumococcal polysaccharide (PPSV23) vaccine. One dose is recommended after  age 52.  Meningococcal vaccine. You may need this if you have certain conditions.  Hepatitis A vaccine. You may need this if you have certain conditions or if you travel or work in places where you may be exposed to hepatitis A.  Hepatitis B vaccine. You may need this if you have certain conditions or if you travel or work in places where you may be exposed to hepatitis B.  Haemophilus  influenzae type b (Hib) vaccine. You may need this if you have certain conditions.  Talk to your health care provider about which screenings and vaccines you need and how often you need them. This information is not intended to replace advice given to you by your health care provider. Make sure you discuss any questions you have with your health care provider. Document Released: 10/05/2015 Document Revised: 05/28/2016 Document Reviewed: 07/10/2015 Elsevier Interactive Patient Education  [employer].

## 2017-10-08 ENCOUNTER — Ambulatory Visit: Payer: Medicare Other | Admitting: Physical Therapy

## 2017-10-08 ENCOUNTER — Encounter: Payer: Self-pay | Admitting: Physical Therapy

## 2017-10-08 DIAGNOSIS — M25651 Stiffness of right hip, not elsewhere classified: Secondary | ICD-10-CM | POA: Diagnosis not present

## 2017-10-08 DIAGNOSIS — M25551 Pain in right hip: Secondary | ICD-10-CM

## 2017-10-08 DIAGNOSIS — M62838 Other muscle spasm: Secondary | ICD-10-CM

## 2017-10-08 NOTE — Therapy (Signed)
[facility] [place], [place], [address] Phone: [phone]   Fax:  [phone]  Physical Therapy Treatment  Patient Details  Name: Angela Burgess MRN: [id number] Date of Birth: 12-23-1940 Referring Provider: Dr Cy Blamer   Encounter Date: 10/08/2017  PT End of Session - 10/08/17 1056    Visit Number  5    Number of Visits  6    Date for PT Re-Evaluation  10/15/17    PT Start Time  1056    PT Stop Time  1148    PT Time Calculation (min)  52 min    Activity Tolerance  Patient tolerated treatment well       Past Medical History:  Diagnosis Date  . Anemia   . Arthritis   . Cervical cancer ([place]) 1968  . Dexamethasone adverse reaction 2008  . Elevated MCV    for years  . Eye exam abnormal 6/09  . Family history of malignant neoplasm of gastrointestinal tract   . Gastritis   . History of mammogram 1/10  . History of normal resting EKG 2005  . Hyperlipidemia   . Hypothyroidism   . IBS (irritable bowel syndrome)   . Macrocytosis    with theme eval in past and normal b12  . Osteopenia   . Rectocele   . Rosacea   . Tetanus-diphtheria (Td) vaccination 1999    Past Surgical History:  Procedure Laterality Date  . ABDOMINAL HYSTERECTOMY    . APPENDECTOMY    . BREAST LUMPECTOMY    . CATARACT EXTRACTION, BILATERAL Bilateral    one in june and one in july; lens implant     There were no vitals filed for this visit.  Subjective Assessment - 10/08/17 1056    Subjective  Angela Burgess reports she is about 60% improved since having her last treatment, is sleeping through the night now as well, currently performing cat/cow, HS stretch and childs pose    Patient Stated Goals  have a little more DN and decrease her pain so she can site see for her Gervais.     Currently in Pain?  Yes    Pain Score  2     Pain Location  Hip    Pain Orientation  Right    Pain Descriptors / Indicators  Dull    Pain Type  Chronic pain    Pain Radiating Towards  into groin    Pain Onset  More than a month ago    Pain Frequency  Constant    Aggravating Factors   working on puzzle on high table leaning forward    Pain Relieving Factors  gentle motion and DN                      [facility] Adult PT Treatment/Exercise - 10/08/17 0001      Knee/Hip Exercises: Stretches   Other Knee/Hip Stretches  2x45 secs low lunge hip flexor stretch      Knee/Hip Exercises: Aerobic   Stationary Bike  --    Nustep  L5x5'  legs only      Modalities   Modalities  Electrical Stimulation;Moist Heat      Moist Heat Therapy   Number Minutes Moist Heat  20 Minutes    Moist Heat Location  Hip anterior Rt      Electrical Stimulation   Electrical Stimulation Location  Rt hip adductors and groin    Electrical Stimulation Action  IFC    Electrical Stimulation Parameters  to tolerance    Electrical Stimulation Goals  Pain;Tone      Manual Therapy   Manual Therapy  Soft tissue mobilization;Manual Traction    Soft tissue mobilization  STM to Rt LE hip adductors and iliacis        Trigger Point Dry Needling - 10/08/17 1100    Consent Given?  Yes    Education Handout Provided  No    Muscles Treated Lower Body  Quadriceps;Adductor longus/brevius/maximus Rt    Quadriceps Response  Palpable increased muscle length;Twitch response elicited Rt    Adductor Response  Twitch response elicited;Palpable increased muscle length Rt                PT Long Term Goals - 10/08/17 1108      PT LONG TERM GOAL #1   Title  I with advanced HEP for lower body flexibility for new stretches ( 10/15/17)    Status  Achieved      PT LONG TERM GOAL #2   Title  increase Rt hip IR/ER to within 5 degrees of Lt ( 10/15/17)     Status  On-going      PT LONG TERM GOAL #3   Title  report =/> 75% reduction of Rt hip pain to allow her to manuever up/down multiple steps ( 10/15/17)     Status  On-going 60% limited      PT  LONG TERM GOAL #4   Title  increase Rt lower body ROM to allow her to easily donn socks and shoes ( 10/15/17)     Status  Achieved      PT LONG TERM GOAL #5   Title  improve FOTO =/< 39% limited, CJ level (10/15/17)     Status  On-going            Plan - 10/08/17 1109    Clinical Impression Statement  Kahlan had a great improvement over the last week with releasing her Rt hip flexors,  She is sleeping through the night now and has 60% improvement with palpable decrease in tightness of Rt anterior and medial hip muscles.  Progressing to goals and improving ROM.     Rehab Potential  Excellent    PT Frequency  1x / week    PT Duration  6 weeks    PT Treatment/Interventions  Iontophoresis 4mg /ml Dexamethasone;Gait training;Dry needling;Manual techniques;Moist Heat;Ultrasound;Therapeutic activities;Patient/family education;Cryotherapy;Electrical Stimulation;Passive range of motion    PT Next Visit Plan  FOTO and discharge to HEP    Consulted and Agree with Plan of Care  Patient       Patient will benefit from skilled therapeutic intervention in order to improve the following deficits and impairments:  Pain, Increased muscle spasms, Hypomobility, Decreased range of motion, Difficulty walking  Visit Diagnosis: Stiffness of right hip, not elsewhere classified  Pain in right hip  Other muscle spasm     Problem List Patient Active Problem List   Diagnosis Date Noted  . Primary osteoarthritis of both hands 12/20/2016  . Primary osteoarthritis of both knees 12/20/2016  . Primary osteoarthritis of both feet 12/20/2016  . Hypothyroidism 09/05/2014  . Hyperlipidemia 09/05/2014  . Medicare annual wellness visit, subsequent 09/05/2014  . Lumbar spondylosis 07/07/2014  . 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 Visit for preventive health examination 08/31/2013  . Hair loss 01/20/2013  . Elevated MCV 07/22/2012  . Preventative health care 04/02/2011  . IRRITABLE BOWEL SYNDROME 07/10/2010  .  GASTRITIS 07/04/2010  .  RHINITIS 05/21/2010  . VERTIGO 05/21/2010  . VARICOSE VEINS, LOWER EXTREMITIES 11/13/2009  . OTHER DISEASES OF NASAL CAVITY AND SINUSES 01/31/2008  . HYPOTHYROIDISM 08/31/2007  . HYPERLIPIDEMIA 08/31/2007  . OTHER SPEC DISEASES BLOOD&BLOOD-FORMING ORGANS 08/31/2007  . ROSACEA 08/31/2007  . Osteoarthritis of right hip 08/31/2007  . OSTEOPENIA 08/31/2007    [name] PT  10/08/2017, 11:30 AM  [facility] [place], [place], [address] Phone: [phone]   Fax:  [phone]  Name: TIARE ROHLMAN MRN: [id number] Date of Birth: [birth year]

## 2017-10-08 NOTE — Patient Instructions (Signed)
Hip Flexor Stretch: Proposal Pose, Low Lunge (Blocks) - Variation - work toward lifting chest and not holding on    Hands on mat or blocks, chest against front thigh, maintain pelvic tilt. Engage posterior hip muscles (firm glute muscles of leg in back position). Front heel on floor, ease hips forward to deepen stretch.Hold for __45-60__ secs. Repeat _1-2___ times each leg. Once aday.

## 2017-10-13 ENCOUNTER — Encounter: Payer: Self-pay | Admitting: Physical Therapy

## 2017-10-13 ENCOUNTER — Ambulatory Visit: Payer: Medicare Other | Admitting: Physical Therapy

## 2017-10-13 DIAGNOSIS — M25651 Stiffness of right hip, not elsewhere classified: Secondary | ICD-10-CM

## 2017-10-13 DIAGNOSIS — M25551 Pain in right hip: Secondary | ICD-10-CM | POA: Diagnosis not present

## 2017-10-13 DIAGNOSIS — M25652 Stiffness of left hip, not elsewhere classified: Secondary | ICD-10-CM | POA: Diagnosis not present

## 2017-10-13 DIAGNOSIS — M62838 Other muscle spasm: Secondary | ICD-10-CM | POA: Diagnosis not present

## 2017-10-13 NOTE — Therapy (Signed)
[place], [place], [address] Phone: [phone]   Fax:  [phone]  Physical Therapy Treatment  Patient Details  Name: Angela Burgess MRN: [id number] Date of Birth: 1941-02-23 Referring Provider: Dr Cy Blamer   Encounter Date: 10/13/2017  PT End of Session - 10/13/17 1015    Visit Number  6    Date for PT Re-Evaluation  10/15/17    PT Start Time  7681    PT Stop Time  1114    PT Time Calculation (min)  59 min    Activity Tolerance  Patient tolerated treatment well       Past Medical History:  Diagnosis Date  . Anemia   . Arthritis   . Cervical cancer ([place]) 1968  . Dexamethasone adverse reaction 2008  . Elevated MCV    for years  . Eye exam abnormal 6/09  . Family history of malignant neoplasm of gastrointestinal tract   . Gastritis   . History of mammogram 1/10  . History of normal resting EKG 2005  . Hyperlipidemia   . Hypothyroidism   . IBS (irritable bowel syndrome)   . Macrocytosis    with theme eval in past and normal b12  . Osteopenia   . Rectocele   . Rosacea   . Tetanus-diphtheria (Td) vaccination 1999    Past Surgical History:  Procedure Laterality Date  . ABDOMINAL HYSTERECTOMY    . APPENDECTOMY    . BREAST LUMPECTOMY    . CATARACT EXTRACTION, BILATERAL Bilateral    one in june and one in july; lens implant     There were no vitals filed for this visit.  Subjective Assessment - 10/13/17 1016    Subjective  Shalayah reports she is feeling good and therapy has helped a lot. She also has had relief in her Rt toes/foot as we have treated her hip.     Patient Stated Goals  have a little more DN and decrease her pain so she can site see for her [place].     Currently in Pain?  Yes    Pain Score  2     Pain Location  -- thigh    Pain Orientation  Right    Pain Descriptors / Indicators  Dull    Pain Type  Chronic pain    Pain Onset  More than a  month ago    Pain Frequency  Constant    Aggravating Factors   nothing in particular    Pain Relieving Factors  heat         [facility] PT Assessment - 10/13/17 0001      Assessment   Medical Diagnosis  bilat hand and knee pain, LBP    Referring Provider  Dr Cy Blamer      Observation/Other Assessments   Focus on Therapeutic Outcomes (FOTO)   31% limited      AROM   AROM Assessment Site  Hip    Right/Left Hip  Left;Right    Right Hip External Rotation   38    Right Hip Internal Rotation   34    Right Hip ABduction  40    Left Hip External Rotation   50    Left Hip Internal Rotation   58      Flexibility   Hamstrings  supine SLR Rt 77, Lt 74                  [facility]  Adult PT Treatment/Exercise - 10/13/17 0001      Knee/Hip Exercises: Stretches   Quad Stretch  Right    Hip Flexor Stretch  Right;30 seconds    Piriformis Stretch  60 seconds      Knee/Hip Exercises: Aerobic   Nustep  L5x5'       Modalities   Modalities  Electrical Stimulation;Moist Heat      Moist Heat Therapy   Number Minutes Moist Heat  20 Minutes    Moist Heat Location  Hip Rt      Electrical Stimulation   Electrical Stimulation Location  Rt quad and TFL    Electrical Stimulation Action  IFC    Electrical Stimulation Parameters  to tolerance    Electrical Stimulation Goals  Pain;Tone      Manual Therapy   Manual Therapy  Soft tissue mobilization;Manual Traction    Soft tissue mobilization  STM to Rt LE hip adductors and iliacis , qads       Trigger Point Dry Needling - 10/13/17 1043    Consent Given?  Yes    Education Handout Provided  No    Muscles Treated Upper Body  Iliopsoas Rt with good release    Muscles Treated Lower Body  Quadriceps    Quadriceps Response  Palpable increased muscle length;Twitch response elicited Rt                PT Long Term Goals - 10/13/17 1024      PT LONG TERM GOAL #1   Title  I with advanced HEP for lower body flexibility for new  stretches ( 10/15/17)    Status  Achieved      PT LONG TERM GOAL #2   Title  increase Rt hip IR/ER to within 5 degrees of Lt ( 10/15/17)     Status  Not Met however did have increase in Rt hip motion      PT LONG TERM GOAL #3   Title  report =/> 75% reduction of Rt hip pain to allow her to manuever up/down multiple steps ( 10/15/17)     Status  Not Met about 60-70% improvement      PT LONG TERM GOAL #4   Title  increase Rt lower body ROM to allow her to easily donn socks and shoes ( 10/15/17)     Status  Achieved      PT LONG TERM GOAL #5   Title  improve FOTO =/< 39% limited, CJ level (10/15/17)     Status  Achieved 31% limited            Plan - 10/13/17 1055    Clinical Impression Statement  Angela Burgess has made progress, partailly met her goals.  She is pleased with her function and is ready to be discharged to her HEP        Patient will benefit from skilled therapeutic intervention in order to improve the following deficits and impairments:     Visit Diagnosis: Stiffness of right hip, not elsewhere classified  Pain in right hip  Other muscle spasm  Stiffness of left hip, not elsewhere classified     Problem List Patient Active Problem List   Diagnosis Date Noted  . Primary osteoarthritis of both hands 12/20/2016  . Primary osteoarthritis of both knees 12/20/2016  . Primary osteoarthritis of both feet 12/20/2016  . Hypothyroidism 09/05/2014  . Hyperlipidemia 09/05/2014  . Medicare annual wellness visit, subsequent 09/05/2014  . Lumbar spondylosis 07/07/2014  . 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  Visit for preventive health examination 08/31/2013  . Hair loss 01/20/2013  . Elevated MCV 07/22/2012  . Preventative health care 04/02/2011  . IRRITABLE BOWEL SYNDROME 07/10/2010  . GASTRITIS 07/04/2010  . RHINITIS 05/21/2010  . VERTIGO 05/21/2010  . VARICOSE VEINS, LOWER EXTREMITIES 11/13/2009  . OTHER DISEASES OF NASAL CAVITY AND SINUSES 01/31/2008  . HYPOTHYROIDISM  08/31/2007  . HYPERLIPIDEMIA 08/31/2007  . OTHER SPEC DISEASES BLOOD&BLOOD-FORMING ORGANS 08/31/2007  . ROSACEA 08/31/2007  . Osteoarthritis of right hip 08/31/2007  . OSTEOPENIA 08/31/2007    [name] PT  10/13/2017, 10:56 AM  [facility] [place], [place], [address] Phone: [phone]   Fax:  [phone]  Name: Angela Burgess MRN: [id number] Date of Birth: Mar 27, 1941   PHYSICAL THERAPY DISCHARGE SUMMARY  Visits from Start of Care: 6  Current functional level related to goals / functional outcomes: See above   Remaining deficits: She is able to perform all activities she wishes to perform for now   Education / Equipment: HEP Plan: Patient agrees to discharge.  Patient goals were partially met. Patient is being discharged due to being pleased with the current functional level.  ?????    [name], PT 10/13/17 10:57 AM

## 2017-11-02 ENCOUNTER — Other Ambulatory Visit: Payer: Self-pay | Admitting: Internal Medicine

## 2017-11-04 DIAGNOSIS — H6123 Impacted cerumen, bilateral: Secondary | ICD-10-CM | POA: Diagnosis not present

## 2017-11-24 DIAGNOSIS — L570 Actinic keratosis: Secondary | ICD-10-CM | POA: Diagnosis not present

## 2017-11-24 DIAGNOSIS — D6949 Other primary thrombocytopenia: Secondary | ICD-10-CM | POA: Diagnosis not present

## 2017-12-04 ENCOUNTER — Other Ambulatory Visit: Payer: Self-pay | Admitting: *Deleted

## 2017-12-07 ENCOUNTER — Inpatient Hospital Stay: Payer: Medicare Other | Attending: Hematology & Oncology

## 2017-12-07 DIAGNOSIS — Z79899 Other long term (current) drug therapy: Secondary | ICD-10-CM | POA: Diagnosis not present

## 2017-12-07 DIAGNOSIS — Z7982 Long term (current) use of aspirin: Secondary | ICD-10-CM | POA: Diagnosis not present

## 2017-12-07 LAB — CBC WITH DIFFERENTIAL (CANCER CENTER ONLY)
BASOS PCT: 1 %
Basophils Absolute: 0 10*3/uL (ref 0.0–0.1)
Eosinophils Absolute: 0.1 10*3/uL (ref 0.0–0.5)
Eosinophils Relative: 2 %
HEMATOCRIT: 34.5 % — AB (ref 34.8–46.6)
HEMOGLOBIN: 11.5 g/dL — AB (ref 11.6–15.9)
LYMPHS ABS: 1.3 10*3/uL (ref 0.9–3.3)
LYMPHS PCT: 38 %
MCH: 33.9 pg (ref 26.0–34.0)
MCHC: 33.3 g/dL (ref 32.0–36.0)
MCV: 101.8 fL — AB (ref 81.0–101.0)
MONO ABS: 0.3 10*3/uL (ref 0.1–0.9)
MONOS PCT: 9 %
NEUTROS PCT: 50 %
Neutro Abs: 1.7 10*3/uL (ref 1.5–6.5)
Platelet Count: 216 10*3/uL (ref 145–400)
RBC: 3.39 MIL/uL — ABNORMAL LOW (ref 3.70–5.32)
RDW: 11.8 % (ref 11.1–15.7)
WBC Count: 3.5 10*3/uL — ABNORMAL LOW (ref 3.9–10.0)

## 2017-12-07 LAB — COMPREHENSIVE METABOLIC PANEL
ALK PHOS: 57 U/L (ref 40–150)
ALT: 12 U/L (ref 0–55)
ANION GAP: 5 (ref 3–11)
AST: 19 U/L (ref 5–34)
Albumin: 3.9 g/dL (ref 3.5–5.0)
BILIRUBIN TOTAL: 0.4 mg/dL (ref 0.2–1.2)
BUN: 17 mg/dL (ref 7–26)
CALCIUM: 9.6 mg/dL (ref 8.4–10.4)
CO2: 29 mmol/L (ref 22–29)
Chloride: 104 mmol/L (ref 98–109)
Creatinine, Ser: 0.81 mg/dL (ref 0.60–1.10)
GFR calc Af Amer: >60 mL/min (ref >60)
GLUCOSE: 90 mg/dL (ref 70–140)
Potassium: 4.6 mmol/L (ref 3.5–5.1)
Sodium: 138 mmol/L (ref 136–145)
TOTAL PROTEIN: 6.9 g/dL (ref 6.4–8.3)

## 2017-12-07 LAB — IRON AND TIBC
IRON: 121 ug/dL (ref 41–142)
Saturation Ratios: 50 % (ref 21–57)
TIBC: 241 ug/dL (ref 236–444)
UIBC: 120 ug/dL

## 2017-12-07 LAB — FERRITIN: Ferritin: 10 ng/mL (ref 9–269)

## 2017-12-08 ENCOUNTER — Encounter: Payer: Self-pay | Admitting: *Deleted

## 2017-12-08 ENCOUNTER — Telehealth: Payer: Self-pay | Admitting: *Deleted

## 2017-12-08 NOTE — Progress Notes (Signed)
 Office Visit Note  Patient: Angela Burgess             Date of Birth: 09/05/1941           MRN: [id number]             PCP: Panosh, Wanda K, MD Referring: Panosh, Wanda K, MD Visit Date: 12/22/2017 Occupation: @GUAROCC@    Subjective:  Lower back pain   History of Present Illness: Angela Burgess is a 76 y.o. female with history of hereditary hemochromatosis, osteoarthritis, DDD, and osteopenia.  Patient reports she continues to see Dr. Ennever for management of hemochromatosis.  She states her last phlebotomy was 3 weeks ago.  She has a phlebotomy every 6 months, and she follows up with Dr. Ennever every 3 months.  She will be seeing him in June 2019.  Patient states that she continues to have pain in her lower back.  She denies any radiculopathy symptoms.  She denies any injuries.  She states the pain is worse when sitting or walking for prolonged periods of time.  She states that she has no pain or swelling in her bilateral hands, knees, or feet at this time.  She wears supportive shoes.  She does stretching exercises on a daily basis and attends [employer] 3 days a week for balance and arthritis.  She states her hand stiffness and range of motion have been improving since starting tah chi.  She states she has chronic numbness on the lateral aspect of her right leg from her knee down.  She denies any injury.     Activities of Daily Living:  Patient reports morning stiffness for 30 minutes.   Patient Denies nocturnal pain.  Difficulty dressing/grooming: Denies Difficulty climbing stairs: Denies Difficulty getting out of chair: Reports Difficulty using hands for taps, buttons, cutlery, and/or writing: Denies   Review of Systems  Constitutional: Negative for fatigue.  HENT: Negative for mouth sores, mouth dryness and nose dryness.   Eyes: Positive for dryness. Negative for pain, redness and visual disturbance.  Respiratory: Negative for cough, hemoptysis, shortness of breath and  difficulty breathing.   Cardiovascular: Negative for chest pain, palpitations, hypertension and swelling in legs/feet.  Gastrointestinal: Positive for constipation. Negative for blood in stool and diarrhea.  Endocrine: Negative for increased urination.  Genitourinary: Negative for painful urination.  Musculoskeletal: Positive for arthralgias, joint pain and morning stiffness. Negative for joint swelling, myalgias, muscle weakness, muscle tenderness and myalgias.  Skin: Negative for color change, pallor, rash, hair loss, nodules/bumps, skin tightness, ulcers and sensitivity to sunlight.  Allergic/Immunologic: Negative for susceptible to infections.  Neurological: Negative for dizziness, numbness, headaches and weakness.  Hematological: Negative for swollen glands.  Psychiatric/Behavioral: Negative for depressed mood and sleep disturbance. The patient is nervous/anxious.     PMFS History:  Patient Active Problem List   Diagnosis Date Noted  . Primary osteoarthritis of both hands 12/20/2016  . Primary osteoarthritis of both knees 12/20/2016  . Primary osteoarthritis of both feet 12/20/2016  . Hypothyroidism 09/05/2014  . Hyperlipidemia 09/05/2014  . Medicare annual wellness visit, subsequent 09/05/2014  . Lumbar spondylosis 07/07/2014  . 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 Visit for preventive health examination 08/31/2013  . Hair loss 01/20/2013  . Elevated MCV 07/22/2012  . Preventative health care 04/02/2011  . IRRITABLE BOWEL SYNDROME 07/10/2010  . GASTRITIS 07/04/2010  . RHINITIS 05/21/2010  . VERTIGO 05/21/2010  . VARICOSE VEINS, LOWER EXTREMITIES 11/13/2009  . OTHER DISEASES OF NASAL CAVITY AND SINUSES 01/31/2008  .   HYPOTHYROIDISM 08/31/2007  . HYPERLIPIDEMIA 08/31/2007  . OTHER SPEC DISEASES BLOOD&BLOOD-FORMING ORGANS 08/31/2007  . ROSACEA 08/31/2007  . Osteoarthritis of right hip 08/31/2007  . OSTEOPENIA 08/31/2007    Past Medical History:  Diagnosis Date  . Anemia   .  Arthritis   . Cervical cancer ([facility]) 1968  . Dexamethasone adverse reaction 2008  . Elevated MCV    for years  . Eye exam abnormal 6/09  . Family history of malignant neoplasm of gastrointestinal tract   . Gastritis   . History of mammogram 1/10  . History of normal resting EKG 2005  . Hyperlipidemia   . Hypothyroidism   . IBS (irritable bowel syndrome)   . Macrocytosis    with theme eval in past and normal b12  . Osteopenia   . Rectocele   . Rosacea   . Tetanus-diphtheria (Td) vaccination 1999    Family History  Problem Relation Age of Onset  . Cirrhosis Mother   . Kidney disease Mother   . Allergy (severe) Mother        protein allergy that sent posion to her brain  . Leukemia Father   . Heart attack Father 55  . Diabetes Father   . Bipolar disorder Sister   . Stroke Sister   . Lung cancer Brother        smoker  . Seizures Brother   . Bipolar disorder Son   . Colon cancer Brother   . Seizures Sister   . Seizures Sister    Past Surgical History:  Procedure Laterality Date  . ABDOMINAL HYSTERECTOMY    . APPENDECTOMY    . BREAST LUMPECTOMY    . CATARACT EXTRACTION, BILATERAL Bilateral    one in june and one in july; lens implant    Social History   Social History Narrative   hh of 2    retired  from [employer]   Travels to visit grand children   Neg tad      Objective: Vital Signs: BP 139/60 (BP Location: Left Arm, Patient Position: Sitting, Cuff Size: Small)   Pulse (!) 58   Resp 12   Wt 130 lb (59 kg)   BMI 22.31 kg/m    Physical Exam  Constitutional: She is oriented to person, place, and time. She appears well-developed and well-nourished.  HENT:  Head: Normocephalic and atraumatic.  Eyes: Conjunctivae and EOM are normal.  Neck: Normal range of motion.  Cardiovascular: Normal rate, regular rhythm, normal heart sounds and intact distal pulses.  Pulmonary/Chest: Effort normal and breath sounds normal.  Abdominal: Soft. Bowel sounds are normal.    Lymphadenopathy:    She has no cervical adenopathy.  Neurological: She is alert and oriented to person, place, and time.  Skin: Skin is warm and dry. Capillary refill takes less than 2 seconds.  Psychiatric: She has a normal mood and affect. Her behavior is normal.  Nursing note and vitals reviewed.    Musculoskeletal Exam: C-spine limited ROM with lateral rotation to the right. Mild thoracic kyphosis, Lumbar spine good range of motion.  No midline spinal tenderness.  No SI joint tenderness.  Shoulder joints, elbow joints, wrist joints, MCPs, PIPs, DIPs good range of motion with no synovitis.  Some tenderness on the radial aspect of her right wrist.  Right 3rd PIP inflammation.  Bilateral second and third MCP joint synovial thickening consistent with hemochromatosis.  Hip joints limited range of motion with some discomfort.  Knee joints, ankle joints, MTPs, PIPs, DIPs good range   of motion with no synovitis.  She has no warmth or effusion of bilateral knees.  No tenderness of trochanteric bursa.   CDAI Exam: No CDAI exam completed.    Investigation: No additional findings. CBC Latest Ref Rng & Units 12/07/2017 08/24/2017 05/11/2017  WBC 3.9 - 10.0 K/uL 3.5(L) 4.1 3.8(L)  Hemoglobin 11.6 - 15.9 g/dL - 12.1 11.9  Hematocrit 34.8 - 46.6 % 34.5(L) 34.9 34.4(L)  Platelets 145 - 400 K/uL 216 226 221   CMP Latest Ref Rng & Units 12/07/2017 08/24/2017 05/11/2017  Glucose 70 - 140 mg/dL 90 99 102  BUN 7 - 26 mg/dL 17 16 15  Creatinine 0.60 - 1.10 mg/dL 0.81 1.1 0.9  Sodium 136 - 145 mmol/L 138 145 140  Potassium 3.5 - 5.1 mmol/L 4.6 3.9 4.2  Chloride 98 - 109 mmol/L 104 102 102  CO2 22 - 29 mmol/L 29 31 33  Calcium 8.4 - 10.4 mg/dL 9.6 9.5 9.2  Total Protein 6.4 - 8.3 g/dL 6.9 6.6 7.1  Total Bilirubin 0.2 - 1.2 mg/dL 0.4 0.60 0.80  Alkaline Phos 40 - 150 U/L 57 65 55  AST 5 - 34 U/L 19 22 23  ALT 0 - 55 U/L 12 18 14    Imaging: No results found.  Speciality Comments: No specialty comments  available.    Procedures:  No procedures performed Allergies: Tetracycline   Assessment / Plan:     Visit Diagnoses: Hereditary hemochromatosis ([facility]CC):  She sees Dr. Ennever for management of hemochromatosis.  Per patient her last phlebotomy was 3 weeks ago.  She has a phlebotomy every 6 months, and she follows up with Dr. Ennever every 3 months.  She will be seeing him in June 2019. She has synovial thickening of bilateral 2nd and 3rd MCPs consistent with findings of hemochromatosis.   Primary osteoarthritis of both hands: PIP and DIP synovial thickening consistent with osteoarthritis.  She has inflammation of right 3rd PIP joint.  Joint protection and muscle strengthening were discussed.  Primary osteoarthritis of both knees: No warmth or effusion on exam.  Good range of motion.  Primary osteoarthritis of both feet: She has no discomfort at this time.  She wears proper fitting shoes.  Primary osteoarthritis of right hip: She has some limited range of motion of her right hip with some discomfort.  She was given a handout for hip exercises that she can perform at home.  DDD (degenerative disc disease), lumbar: No midline spinal tenderness.  She performs stretching exercises on a daily basis.  She also attends tai chi 3 times a week.  Osteopenia of multiple sites - She takes Vitamin D and Calcium daily.  Other medical conditions are listed as follows:  History of hypothyroidism  History of hyperlipidemia  Rosacea  History of IBS    Orders: No orders of the defined types were placed in this encounter.  No orders of the defined types were placed in this encounter.     Follow-Up Instructions: Return in about 6 months (around 06/23/2018) for Osteoarthritis.   Taylor M Dale, PA-C   I examined and evaluated the patient with Taylor Dale PA. The plan of care was discussed as noted above.  Shaili Deveshwar, MD  Note - This record has been created using Dragon software.  Chart  creation errors have been sought, but may not always  have been located. Such creation errors do not reflect on  the standard of medical care. 

## 2017-12-08 NOTE — Telephone Encounter (Addendum)
Message left. Will send message via My Chart  ----- Message from [name], MD sent at 12/07/2017  4:31 PM EDT ----- Call - iron level is ok!!  NO phlebotomy!!  Laurey Arrow

## 2017-12-10 ENCOUNTER — Ambulatory Visit: Payer: Medicare Other | Admitting: Hematology & Oncology

## 2017-12-10 ENCOUNTER — Ambulatory Visit: Payer: Medicare Other | Admitting: Family

## 2017-12-10 ENCOUNTER — Other Ambulatory Visit: Payer: Medicare Other

## 2017-12-14 ENCOUNTER — Inpatient Hospital Stay (HOSPITAL_BASED_OUTPATIENT_CLINIC_OR_DEPARTMENT_OTHER): Payer: Medicare Other | Admitting: Family

## 2017-12-14 ENCOUNTER — Inpatient Hospital Stay: Payer: Medicare Other

## 2017-12-14 DIAGNOSIS — Z79899 Other long term (current) drug therapy: Secondary | ICD-10-CM | POA: Diagnosis not present

## 2017-12-14 DIAGNOSIS — Z7982 Long term (current) use of aspirin: Secondary | ICD-10-CM

## 2017-12-14 NOTE — Progress Notes (Signed)
Hematology and Oncology Follow Up Visit  Angela Burgess [id number] April 29, 1941 77 y.o. 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 Angela Burgess is here today for follow-up. She is doing quite well and enjoying the beautiful weather.  Ferritin was 10 but iron saturation is 50%. She does not feel that she needs a phlebotomy at this time.  She has had no fever, chills, headache, blurred vision, dizziness, SOB, chest pain, palpitations, abdominal pain or changes in bowel or bladder habits.  She has generalized aches and pains due to arthritis. This is unchanged.  No swelling in her extremities. She has tingling in her feet she states due to a bulging disc in her lower back.  No lymphadenopathy found on exam.  No episodes of bleeding, no bruising or petechiae.  She has maintained a good appetite and is staying well hydrated. Her weight is stable.   ECOG Performance Status: 0 - Asymptomatic  Medications:  Allergies as of 12/14/2017      Reactions   Tetracycline Rash      Medication List        Accurate as of 12/14/17  9:26 AM. Always use your most recent med list.          aspirin 81 MG tablet Take 81 mg by mouth daily.   Astaxanthin 4 MG Caps Take 1 capsule by mouth 2 (two) times daily.   Calcium 500/D 500-125 MG-UNIT Tabs Generic drug:  Calcium Carbonate-Vitamin D Take by mouth.   cetirizine 10 MG tablet Commonly known as:  ZYRTEC Take by mouth.   diclofenac sodium 1 % Gel Commonly known as:  VOLTAREN 3 grams to 3 large joints up to three times a day   estradiol 0.1 MG/GM vaginal cream Commonly known as:  ESTRACE Place 1 Applicatorful vaginally 2 (two) times a week.   fish oil-omega-3 fatty acids 1000 MG capsule Take 2 g by mouth daily.   ketoconazole 2 % cream Commonly known as:  NIZORAL APP EXT AA OF FUNGUS BID PRN   levothyroxine 112 MCG tablet Commonly known as:  SYNTHROID, LEVOTHROID TAKE 1 TABLET BY MOUTH EVERY DAY   metroNIDAZOLE 0.75 % cream Commonly known as:  METROCREAM Apply 1 application topically daily.   multivitamin tablet Take 1 tablet by mouth daily.   triamcinolone cream 0.1 % Commonly known as:  KENALOG APP AA OF FOOT BID PRN FOR ITCHING   Vitamin D3 2000 units Tabs Take by mouth every morning.       Allergies:  Allergies  Allergen Reactions  . Tetracycline Rash    Past Medical History, Surgical history, Social history, and Family History were reviewed and updated.  Review of Systems: All other 10 point review of systems is negative.   Physical Exam:  vitals were not taken for this visit.   Wt Readings from Last 3 Encounters:  10/06/17 128 lb 3.2 oz (58.2 kg)  08/27/17 129 lb (58.5 kg)  07/02/17 126 lb 7 oz (57.4 kg)    Ocular: Sclerae unicteric, pupils equal, round and reactive to light Ear-nose-throat: Oropharynx clear, dentition fair Lymphatic: No cervical, supraclavicular or axillary adenopathy Lungs no rales or rhonchi, good excursion bilaterally Heart regular rate and rhythm, no murmur appreciated Abd soft, nontender, positive bowel sounds, no liver or spleen tip palpated on exam, no fluid wave  MSK no focal spinal tenderness, no  joint edema Neuro: non-focal, well-oriented, appropriate affect Breasts: Deferred   Lab Results  Component Value Date   WBC 3.5 (L) 12/07/2017   HGB 12.1 08/24/2017   HCT 34.5 (L) 12/07/2017   MCV 101.8 (H) 12/07/2017   PLT 216 12/07/2017   Lab Results  Component Value Date   FERRITIN 10 12/07/2017   IRON 121 12/07/2017   TIBC 241 12/07/2017   UIBC 120 12/07/2017   IRONPCTSAT 50 12/07/2017   Lab Results  Component Value Date   RETICCTPCT 1.4 06/13/2015   RBC 3.39 (L) 12/07/2017   RETICCTABS 47.6 06/13/2015   No results found for: KPAFRELGTCHN, LAMBDASER, KAPLAMBRATIO No results found for: IGGSERUM, IGA,  IGMSERUM No results found for: Angela Burgess, SPEI   Chemistry      Component Value Date/Time   NA 138 12/07/2017 0908   NA 145 08/24/2017 1052   NA 138 01/27/2017 1122   K 4.6 12/07/2017 0908   K 3.9 08/24/2017 1052   K 4.6 01/27/2017 1122   CL 104 12/07/2017 0908   CL 102 08/24/2017 1052   CO2 29 12/07/2017 0908   CO2 31 08/24/2017 1052   CO2 29 01/27/2017 1122   BUN 17 12/07/2017 0908   BUN 16 08/24/2017 1052   BUN 14.6 01/27/2017 1122   CREATININE 0.81 12/07/2017 0908   CREATININE 1.1 08/24/2017 1052   CREATININE 0.8 01/27/2017 1122      Component Value Date/Time   CALCIUM 9.6 12/07/2017 0908   CALCIUM 9.5 08/24/2017 1052   CALCIUM 9.7 01/27/2017 1122   ALKPHOS 57 12/07/2017 0908   ALKPHOS 65 08/24/2017 1052   ALKPHOS 58 01/27/2017 1122   AST 19 12/07/2017 0908   AST 22 08/24/2017 1052   AST 18 01/27/2017 1122   ALT 12 12/07/2017 0908   ALT 18 08/24/2017 1052   ALT 13 01/27/2017 1122   BILITOT 0.4 12/07/2017 0908   BILITOT 0.60 08/24/2017 1052   BILITOT 0.42 01/27/2017 1122      Impression and Plan: Ms. Angela Burgess is a very pleasant 77 yo caucasian female with hemochromatosis, homozygous for C282Y mutation. She continues to do well and has no complaints at this time.  Her iron saturation is stable at 50% so we will hold off on phlebotomizing her this visit.  We will see her back in another 3 months for follow-up.  She will contact our office with any questions or concerns. We can certainly see her sooner if need be.   [name], NP 3/25/20199:26 AM

## 2017-12-22 ENCOUNTER — Ambulatory Visit: Payer: Self-pay | Admitting: Rheumatology

## 2017-12-22 ENCOUNTER — Encounter: Payer: Self-pay | Admitting: Physician Assistant

## 2017-12-22 ENCOUNTER — Ambulatory Visit: Payer: Medicare Other | Admitting: Physician Assistant

## 2017-12-22 DIAGNOSIS — M19042 Primary osteoarthritis, left hand: Secondary | ICD-10-CM

## 2017-12-22 DIAGNOSIS — M19071 Primary osteoarthritis, right ankle and foot: Secondary | ICD-10-CM | POA: Diagnosis not present

## 2017-12-22 DIAGNOSIS — M19041 Primary osteoarthritis, right hand: Secondary | ICD-10-CM

## 2017-12-22 DIAGNOSIS — M1611 Unilateral primary osteoarthritis, right hip: Secondary | ICD-10-CM

## 2017-12-22 DIAGNOSIS — M170 Bilateral primary osteoarthritis of knee: Secondary | ICD-10-CM

## 2017-12-22 DIAGNOSIS — M5136 Other intervertebral disc degeneration, lumbar region: Secondary | ICD-10-CM

## 2017-12-22 DIAGNOSIS — L719 Rosacea, unspecified: Secondary | ICD-10-CM

## 2017-12-22 DIAGNOSIS — M8589 Other specified disorders of bone density and structure, multiple sites: Secondary | ICD-10-CM

## 2017-12-22 DIAGNOSIS — M19072 Primary osteoarthritis, left ankle and foot: Secondary | ICD-10-CM | POA: Diagnosis not present

## 2017-12-22 DIAGNOSIS — Z8719 Personal history of other diseases of the digestive system: Secondary | ICD-10-CM

## 2017-12-22 DIAGNOSIS — Z8639 Personal history of other endocrine, nutritional and metabolic disease: Secondary | ICD-10-CM | POA: Diagnosis not present

## 2017-12-22 NOTE — Patient Instructions (Signed)

## 2018-03-05 ENCOUNTER — Other Ambulatory Visit: Payer: Self-pay | Admitting: *Deleted

## 2018-03-08 ENCOUNTER — Inpatient Hospital Stay: Payer: Medicare Other | Attending: Hematology & Oncology

## 2018-03-08 DIAGNOSIS — M79604 Pain in right leg: Secondary | ICD-10-CM | POA: Diagnosis not present

## 2018-03-08 DIAGNOSIS — Z7982 Long term (current) use of aspirin: Secondary | ICD-10-CM | POA: Diagnosis not present

## 2018-03-08 DIAGNOSIS — Z79899 Other long term (current) drug therapy: Secondary | ICD-10-CM | POA: Insufficient documentation

## 2018-03-08 LAB — CBC WITH DIFFERENTIAL (CANCER CENTER ONLY)
BASOS ABS: 0.1 10*3/uL (ref 0.0–0.1)
BASOS PCT: 2 %
EOS PCT: 2 %
Eosinophils Absolute: 0.1 10*3/uL (ref 0.0–0.5)
HCT: 35.2 % (ref 34.8–46.6)
Hemoglobin: 12.1 g/dL (ref 11.6–15.9)
Lymphocytes Relative: 34 %
Lymphs Abs: 1.2 10*3/uL (ref 0.9–3.3)
MCH: 35.2 pg — ABNORMAL HIGH (ref 26.0–34.0)
MCHC: 34.4 g/dL (ref 32.0–36.0)
MCV: 102.3 fL — ABNORMAL HIGH (ref 81.0–101.0)
MONO ABS: 0.3 10*3/uL (ref 0.1–0.9)
Monocytes Relative: 8 %
Neutro Abs: 1.8 10*3/uL (ref 1.5–6.5)
Neutrophils Relative %: 54 %
Platelet Count: 226 10*3/uL (ref 145–400)
RBC: 3.44 MIL/uL — ABNORMAL LOW (ref 3.70–5.32)
RDW: 12.1 % (ref 11.1–15.7)
WBC Count: 3.4 10*3/uL — ABNORMAL LOW (ref 3.9–10.0)

## 2018-03-08 LAB — CMP (CANCER CENTER ONLY)
ALBUMIN: 3.7 g/dL (ref 3.5–5.0)
ALT: 16 U/L (ref 10–47)
ANION GAP: 9 (ref 5–15)
AST: 20 U/L (ref 11–38)
Alkaline Phosphatase: 56 U/L (ref 26–84)
BUN: 14 mg/dL (ref 7–22)
CALCIUM: 9.6 mg/dL (ref 8.0–10.3)
CO2: 30 mmol/L (ref 18–33)
CREATININE: 0.8 mg/dL (ref 0.60–1.20)
Chloride: 103 mmol/L (ref 98–108)
Glucose, Bld: 94 mg/dL (ref 73–118)
Potassium: 4.2 mmol/L (ref 3.3–4.7)
Sodium: 142 mmol/L (ref 128–145)
Total Bilirubin: 0.8 mg/dL (ref 0.2–1.6)
Total Protein: 6.8 g/dL (ref 6.4–8.1)

## 2018-03-08 LAB — IRON AND TIBC
Iron: 138 ug/dL (ref 41–142)
SATURATION RATIOS: 59 % — AB (ref 21–57)
TIBC: 235 ug/dL — ABNORMAL LOW (ref 236–444)
UIBC: 97 ug/dL

## 2018-03-08 LAB — FERRITIN: Ferritin: 13 ng/mL (ref 9–269)

## 2018-03-11 ENCOUNTER — Inpatient Hospital Stay: Payer: Medicare Other

## 2018-03-11 ENCOUNTER — Other Ambulatory Visit: Payer: Self-pay

## 2018-03-11 ENCOUNTER — Inpatient Hospital Stay: Payer: Medicare Other | Admitting: Family

## 2018-03-11 DIAGNOSIS — Z79899 Other long term (current) drug therapy: Secondary | ICD-10-CM | POA: Diagnosis not present

## 2018-03-11 DIAGNOSIS — Z7982 Long term (current) use of aspirin: Secondary | ICD-10-CM

## 2018-03-11 DIAGNOSIS — M79604 Pain in right leg: Secondary | ICD-10-CM

## 2018-03-11 MED ORDER — SODIUM CHLORIDE 0.9 % IV SOLN
1000.0000 mL | Freq: Once | INTRAVENOUS | Status: AC
  Administered 2018-03-11: 1000 mL via INTRAVENOUS

## 2018-03-11 NOTE — Progress Notes (Signed)
Hematology and Oncology Follow Up Visit  Angela Burgess [id number] 1940/10/29 77 y.o. 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 She is doing well but has been under some stress at home. Her husband has recurrent bladder cancer and is going to have to restart chemo.  She is exercising regularly doing Tai Chi 2 days a week and PT exercises for her OA the other days of the week.  Her iron saturation is 59% with a ferritin of 13.  She has had no fever, chills, n/v, cough, rash, dizziness, headache, blurred vision, SOB, chest pain, palpitations, abdominal pain or changes in bowel or bladder habits.  No swelling, numbness or tingling in her extremities. She will occasionally have pain in the right leg due to chronic back issues. This is unchanged.  No lymphadenopathy noted on exam.  No episodes of bleeding, no bruising or petechiae.  Her appetite comes and goes. She is hydrating well. Her weight is stable.   ECOG Performance Status: 0 - Asymptomatic  Medications:  Allergies as of 03/11/2018      Reactions   Tetracycline Rash      Medication List        Accurate as of 03/11/18  9:18 AM. Always use your most recent med list.          aspirin 81 MG tablet Take 81 mg by mouth daily.   Astaxanthin 4 MG Caps Take 1 capsule by mouth 2 (two) times daily.   Calcium 500/D 500-125 MG-UNIT Tabs Generic drug:  Calcium Carbonate-Vitamin D Take by mouth.   cetirizine 10 MG tablet Commonly known as:  ZYRTEC Take by mouth.   diclofenac sodium 1 % Gel Commonly known as:  VOLTAREN 3 grams to 3 large joints up to three times a day   estradiol 0.1 MG/GM vaginal cream Commonly known as:  ESTRACE Place 1 Applicatorful vaginally 2 (two) times a week.   fish oil-omega-3 fatty acids 1000 MG capsule Take 2 g  by mouth daily.   ketoconazole 2 % cream Commonly known as:  NIZORAL APP EXT AA OF FUNGUS BID PRN   levothyroxine 112 MCG tablet Commonly known as:  SYNTHROID, LEVOTHROID TAKE 1 TABLET BY MOUTH EVERY DAY   metroNIDAZOLE 0.75 % cream Commonly known as:  METROCREAM Apply 1 application topically daily.   multivitamin tablet Take 1 tablet by mouth daily.   triamcinolone cream 0.1 % Commonly known as:  KENALOG APP AA OF FOOT BID PRN FOR ITCHING   Vitamin D3 2000 units Tabs Take by mouth every morning.       Allergies:  Allergies  Allergen Reactions  . Tetracycline Rash    Past Medical History, Surgical history, Social history, and Family History were reviewed and updated.  Review of Systems: All other 10 point review of systems is negative.   Physical Exam:  vitals were not taken for this visit.   Wt Readings from Last 3 Encounters:  12/22/17 130 lb (59 kg)  12/14/17 129 lb 8 oz (58.7 kg)  10/06/17 128 lb 3.2 oz (58.2 kg)    Ocular: Sclerae unicteric, pupils equal, round and reactive to light Ear-nose-throat: Oropharynx clear, dentition fair Lymphatic: No cervical, supraclavicular or axillary adenopathy Lungs no rales or rhonchi, good excursion bilaterally Heart regular rate and rhythm, no murmur appreciated Abd soft, nontender,  positive bowel sounds, no liver or spleen tip palpated on exam, no fluid wave  MSK no focal spinal tenderness, no joint edema Neuro: non-focal, well-oriented, appropriate affect Breasts: Deferred   Lab Results  Component Value Date   WBC 3.4 (L) 03/08/2018   HGB 12.1 03/08/2018   HCT 35.2 03/08/2018   MCV 102.3 (H) 03/08/2018   PLT 226 03/08/2018   Lab Results  Component Value Date   FERRITIN 13 03/08/2018   IRON 138 03/08/2018   TIBC 235 (L) 03/08/2018   UIBC 97 03/08/2018   IRONPCTSAT 59 (H) 03/08/2018   Lab Results  Component Value Date   RETICCTPCT 1.4 06/13/2015   RBC 3.44 (L) 03/08/2018   RETICCTABS 47.6 06/13/2015    No results found for: KPAFRELGTCHN, LAMBDASER, KAPLAMBRATIO No results found for: IGGSERUM, IGA, IGMSERUM No results found for: Odetta Pink, SPEI   Chemistry      Component Value Date/Time   NA 142 03/08/2018 0915   NA 145 08/24/2017 1052   NA 138 01/27/2017 1122   K 4.2 03/08/2018 0915   K 3.9 08/24/2017 1052   K 4.6 01/27/2017 1122   CL 103 03/08/2018 0915   CL 102 08/24/2017 1052   CO2 30 03/08/2018 0915   CO2 31 08/24/2017 1052   CO2 29 01/27/2017 1122   BUN 14 03/08/2018 0915   BUN 16 08/24/2017 1052   BUN 14.6 01/27/2017 1122   CREATININE 0.80 03/08/2018 0915   CREATININE 1.1 08/24/2017 1052   CREATININE 0.8 01/27/2017 1122      Component Value Date/Time   CALCIUM 9.6 03/08/2018 0915   CALCIUM 9.5 08/24/2017 1052   CALCIUM 9.7 01/27/2017 1122   ALKPHOS 56 03/08/2018 0915   ALKPHOS 65 08/24/2017 1052   ALKPHOS 58 01/27/2017 1122   AST 20 03/08/2018 0915   AST 18 01/27/2017 1122   ALT 16 03/08/2018 0915   ALT 18 08/24/2017 1052   ALT 13 01/27/2017 1122   BILITOT 0.8 03/08/2018 0915   BILITOT 0.42 01/27/2017 1122      Impression and Plan: Angela Burgess is a very pleasant 77 yo caucasian female with hemochromatosis, homozygous for C282Y mutation. She is doing well and has no complaints at this time.  Iron saturation is 59%. We will phlebotomize her today.  We will plan to see her back in another 3 months for follow-up.  She will contact our office with any questions or concerns. We can certainly see her sooner if need be.   [name], NP 6/20/20199:18 AM\

## 2018-03-11 NOTE — Patient Instructions (Signed)
Therapeutic Phlebotomy Therapeutic phlebotomy is the controlled removal of blood from a person's body for the purpose of treating a medical condition. The procedure is similar to donating blood. Usually, about a pint (470 mL, or 0.47L) of blood is removed. The average adult has 9-12 pints (4.3-5.7 L) of blood. Therapeutic phlebotomy may be used to treat the following medical conditions:  Hemochromatosis. This is a condition in which the blood contains too much iron.  Polycythemia vera. This is a condition in which the blood contains too many red blood cells.  Porphyria cutanea tarda. This is a disease in which an important part of hemoglobin is not made properly. It results in the buildup of abnormal amounts of porphyrins in the body.  Sickle cell disease. This is a condition in which the red blood cells form an abnormal crescent shape rather than a round shape.  Tell a health care provider about:  Any allergies you have.  All medicines you are taking, including vitamins, herbs, eye drops, creams, and over-the-counter medicines.  Any problems you or family members have had with anesthetic medicines.  Any blood disorders you have.  Any surgeries you have had.  Any medical conditions you have. What are the risks? Generally, this is a safe procedure. However, problems may occur, including:  Nausea or light-headedness.  Low blood pressure.  Soreness, bleeding, swelling, or bruising at the needle insertion site.  Infection.  What happens before the procedure?  Follow instructions from your health care provider about eating or drinking restrictions.  Ask your health care provider about changing or stopping your regular medicines. This is especially important if you are taking diabetes medicines or blood thinners.  Wear clothing with sleeves that can be raised above the elbow.  Plan to have someone take you home after the procedure.  You may have a blood sample taken. What  happens during the procedure?  A needle will be inserted into one of your veins.  Tubing and a collection bag will be attached to that needle.  Blood will flow through the needle and tubing into the collection bag.  You may be asked to open and close your hand slowly and continually during the entire collection.  After the specified amount of blood has been removed from your body, the collection bag and tubing will be clamped.  The needle will be removed from your vein.  Pressure will be held on the site of the needle insertion to stop the bleeding.  A bandage (dressing) will be placed over the needle insertion site. The procedure may vary among health care providers and hospitals. What happens after the procedure?  Your recovery will be assessed and monitored.  You can return to your normal activities as directed by your health care provider. This information is not intended to replace advice given to you by your health care provider. Make sure you discuss any questions you have with your health care provider. Document Released: 02/10/2011 Document Revised: 05/10/2016 Document Reviewed: 09/04/2014 Elsevier Interactive Patient Education  2018 Elsevier Inc.  

## 2018-03-11 NOTE — Progress Notes (Signed)
1 unit phlebotomy performed over 15 minutes using a 18 gauge IV catheter. IV replacement fluids and nourishment provided.

## 2018-03-17 NOTE — Progress Notes (Signed)
Chief Complaint  Patient presents with  . Follow-up    from home visit for PAD    HPI: Angela Burgess 77 y.o. come in for  [place]     And   Had thing on  Toe  And said check for PAD.    Did a urine test and   Memory screen.    No symptoms .    No claudication noted  With current  Activity .   hardt o stand for one spot.   Hip.  Activity level.  Tai chi 2 days a week.   Stretch and exercises  And pt   OA .   Knee and back  Sx   Right foot metatarsal pain at times and swelling on bottom of foot  ROS: See pertinent positives and negatives per HPI. No cv sx pulm sx   Past Medical History:  Diagnosis Date  . Anemia   . Arthritis   . Cervical cancer ([place]) 1968  . Dexamethasone adverse reaction 2008  . Elevated MCV    for years  . Eye exam abnormal 6/09  . Family history of malignant neoplasm of gastrointestinal tract   . Gastritis   . History of mammogram 1/10  . History of normal resting EKG 2005  . Hyperlipidemia   . Hypothyroidism   . IBS (irritable bowel syndrome)   . Macrocytosis    with theme eval in past and normal b12  . Osteopenia   . Rectocele   . Rosacea   . Tetanus-diphtheria (Td) vaccination 1999    Family History  Problem Relation Age of Onset  . Cirrhosis Mother   . Kidney disease Mother   . Allergy (severe) Mother        protein allergy that sent posion to her brain  . Leukemia Father   . Heart attack Father 56  . Diabetes Father   . Bipolar disorder Sister   . Stroke Sister   . Lung cancer Brother        smoker  . Seizures Brother   . Bipolar disorder Son   . Colon cancer Brother   . Seizures Sister   . Seizures Sister     Social History   Socioeconomic History  . Marital status: Married    Spouse name: Not on file  . Number of children: 2  . Years of education: Not on file  . Highest education level: Not on file  Occupational History  . Occupation: retired  Scientific laboratory technician  . Financial resource strain: Not on file  . Food  insecurity:    Worry: Not on file    Inability: Not on file  . Transportation needs:    Medical: Not on file    Non-medical: Not on file  Tobacco Use  . Smoking status: Never Smoker  . Smokeless tobacco: Never Used  . Tobacco comment: never used tobacco  Substance and Sexual Activity  . Alcohol use: Not Currently    Alcohol/week: 0.0 oz    Comment: none now   . Drug use: Never  . Sexual activity: Not on file  Lifestyle  . Physical activity:    Days per week: Not on file    Minutes per session: Not on file  . Stress: Not on file  Relationships  . Social connections:    Talks on phone: Not on file    Gets together: Not on file    Attends religious service: Not on file    Active  member of club or organization: Not on file    Attends meetings of clubs or organizations: Not on file    Relationship status: Not on file  Other Topics Concern  . 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 aspirin 81 MG tablet Take 81 mg by mouth daily.    . Astaxanthin 4 MG CAPS Take 1 capsule by mouth 2 (two) times daily.    . Calcium Carbonate-Vitamin D (CALCIUM 500/D) 500-125 MG-UNIT TABS Take by mouth.      . cetirizine (ZYRTEC) 10 MG tablet Take by mouth.    . Cholecalciferol (VITAMIN D3) 2000 UNITS TABS Take by mouth every morning.    . diclofenac sodium (VOLTAREN) 1 % GEL 3 grams to 3 large joints up to three times a day 3 Tube 3  . estradiol (ESTRACE) 0.1 MG/GM vaginal cream Place 1 Applicatorful vaginally 2 (two) times a week.     . fish oil-omega-3 fatty acids 1000 MG capsule Take 2 g by mouth daily.      [name] ketoconazole (NIZORAL) 2 % cream APP EXT AA OF FUNGUS BID PRN  1  . levothyroxine (SYNTHROID, LEVOTHROID) 112 MCG tablet TAKE 1 TABLET BY MOUTH EVERY DAY 90 tablet 1  . metroNIDAZOLE (METROCREAM) 0.75 % cream Apply 1 application  topically daily.      . Multiple Vitamin (MULTIVITAMIN) tablet Take 1 tablet by mouth daily.      [name] triamcinolone cream (KENALOG) 0.1 % APP AA OF FOOT BID PRN FOR ITCHING  0   No facility-administered medications prior to visit.      EXAM:  BP 128/68 (BP Location: Right Arm, Patient Position: Sitting, Cuff Size: Normal)   Temp 97.8 F (36.6 C) (Oral)   Wt 126 lb (57.2 kg)   SpO2 96%   BMI 21.63 kg/m   Body mass index is 21.63 kg/m.  GENERAL: vitals reviewed and listed above, alert, oriented, appears well hydrated and in no acute distress HEENT: atraumatic, conjunctiva  clear, no obvious abnormalities on inspection of external nose and ears  NECK: no obvious masses on inspection palpation  LUNGS: clear to auscultation bilaterally, no wheezes, rales or rhonchi, good air movement CV: HRRR, no clubbing cyanosis or  peripheral edema nl cap refill  No bruits  abd fem carotid   Pulses  Intact  Right dp may be less than  Left dp? But nl cap refill and  Feet nl hair on toes and nl cap refill  Right sole metatarsa a soft cystoc  [place] are  Skin is normal  MS: moves all extremities without noticeable focal  Abnormality x OA change  PSYCH: pleasant and cooperative, no obvious depression or anxiety Lab Results  Component Value Date   WBC 3.4 (L) 03/08/2018   HGB 12.1 03/08/2018   HCT 35.2 03/08/2018   PLT 226 03/08/2018   GLUCOSE 94 03/08/2018   CHOL 244 (H) 10/06/2017   TRIG 82.0 10/06/2017   HDL 64.60 10/06/2017   LDLDIRECT 165.2 07/21/2012   LDLCALC 163 (H) 10/06/2017   ALT 16 03/08/2018   AST 20 03/08/2018   NA 142 03/08/2018   K 4.2 03/08/2018   CL 103 03/08/2018   CREATININE 0.80 03/08/2018   BUN 14 03/08/2018   CO2 30 03/08/2018   TSH  1.36 10/06/2017   HGBA1C 5.1 09/18/2015   BP Readings from Last 3 Encounters:  03/18/18 128/68  03/11/18 (!) 147/47  12/22/17 139/60     ASSESSMENT AND PLAN:  Discussed the following assessment and plan:  Abnormal finding on  toe  screening test  Skin nodule of right foot   Primary osteoarthritis of both hands No evidence of pad bu hx but does have some  Right sole of foot issues and nodule  Should be checking out   Disc   Pad  And use of statin med if needed    Shared Decision Making no sx and normal exam will follow and not proceed with  further testing at this time  As no advised intervention if not interested in statin  She mya have some reactive local phenom that could have   Altered the toe test and not sure how   Helpful this test is ,. Total visit 28mns > 50% spent counseling and coordinating care as indicated in above note and in instructions to patient .  -Patient advised to return or notify health care team  if  new concerns arise.  Patient Instructions  You exam is good but want you to see the foot  Doctor  .  Let [place] if we need to do a referral.  For this .   Pulses look good and  No evidence today of   Artery insufficiency   But if get   Sx of pad and will reexamine     At your  cpx  .     Peripheral Vascular Disease Peripheral vascular disease (PVD) is a disease of the blood vessels that are not part of your heart and brain. A simple term for PVD is poor circulation. In most cases, PVD narrows the blood vessels that carry blood from your heart to the rest of your body. This can result in a decreased supply of blood to your arms, legs, and internal organs, like your stomach or kidneys. However, it most often affects a person's lower legs and feet. There are two types of PVD.  Organic PVD. This is the more common type. It is caused by damage to the structure of blood vessels.  Functional PVD. This is caused by conditions that make blood vessels contract and tighten (spasm).  Without treatment, PVD tends to get worse over time. PVD can also lead to acute ischemic limb. This is when an arm or limb suddenly has trouble getting enough blood. This is a medical emergency. What are the causes? Each type  of PVD has many different causes. The most common cause of PVD is buildup of a fatty material (plaque) inside of your arteries (atherosclerosis). Small amounts of plaque can break off from the walls of the blood vessels and become lodged in a smaller artery. This blocks blood flow and can cause acute ischemic limb. Other common causes of PVD include:  Blood clots that form inside of blood vessels.  Injuries to blood vessels.  Diseases that cause inflammation of blood vessels or cause blood vessel spasms.  Health behaviors and health history that increase your risk of developing PVD.  What increases the risk? You may have a greater risk of PVD if you:  Have a family history of PVD.  Have certain medical conditions, including: ? High cholesterol. ? Diabetes. ? High blood pressure (hypertension). ? Coronary heart disease. ? Past problems with blood clots. ? Past injury, such as burns or a broken bone. These  may have damaged blood vessels in your limbs. ? Buerger disease. This is caused by inflamed blood vessels in your hands and feet. ? Some forms of arthritis. ? Rare birth defects that affect the arteries in your legs.  Use tobacco.  Do not get enough exercise.  Are obese.  Are age 39 or older.  What are the signs or symptoms? PVD may cause many different symptoms. Your symptoms depend on what part of your body is not getting enough blood. Some common signs and symptoms include:  Cramps in your lower legs. This may be a symptom of poor leg circulation (claudication).  Pain and weakness in your legs while you are physically active that goes away when you rest (intermittent claudication).  Leg pain when at rest.  Leg numbness, tingling, or weakness.  Coldness in a leg or foot, especially when compared with the other leg.  Skin or hair changes. These can include: ? Hair loss. ? Shiny skin. ? Pale or bluish skin. ? Thick toenails.  Inability to get or maintain an  erection (erectile dysfunction).  People with PVD are more prone to developing ulcers and sores on their toes, feet, or legs. These may take longer than normal to heal. How is this diagnosed? Your health care provider may diagnose PVD from your signs and symptoms. The health care provider will also do a physical exam. You may have tests to find out what is causing your PVD and determine its severity. Tests may include:  Blood pressure recordings from your arms and legs and measurements of the strength of your pulses (pulse volume recordings).  Imaging studies using sound waves to take pictures of the blood flow through your blood vessels (Doppler ultrasound).  Injecting a dye into your blood vessels before having imaging studies using: ? X-rays (angiogram or arteriogram). ? Computer-generated X-rays (CT angiogram). ? A powerful electromagnetic field and a computer (magnetic resonance angiogram or MRA).  How is this treated? Treatment for PVD depends on the cause of your condition and the severity of your symptoms. It also depends on your age. Underlying causes need to be treated and controlled. These include long-lasting (chronic) conditions, such as diabetes, high cholesterol, and high blood pressure. You may need to first try making lifestyle changes and taking medicines. Surgery may be needed if these do not work. Lifestyle changes may include:  Quitting smoking.  Exercising regularly.  Following a low-fat, low-cholesterol diet.  Medicines may include:  Blood thinners to prevent blood clots.  Medicines to improve blood flow.  Medicines to improve your blood cholesterol levels.  Surgical procedures may include:  A procedure that uses an inflated balloon to open a blocked artery and improve blood flow (angioplasty).  A procedure to put in a tube (stent) to keep a blocked artery open (stent implant).  Surgery to reroute blood flow around a blocked artery (peripheral bypass  surgery).  Surgery to remove dead tissue from an infected wound on the affected limb.  Amputation. This is surgical removal of the affected limb. This may be necessary in cases of acute ischemic limb that are not improved through medical or surgical treatments.  Follow these instructions at home:  Take medicines only as directed by your health care provider.  Do not use any tobacco products, including cigarettes, chewing tobacco, or electronic cigarettes. If you need help quitting, ask your health care provider.  Lose weight if you are overweight, and maintain a healthy weight as directed by your health care provider.  Eat a diet that is low in fat and cholesterol. If you need help, ask your health care provider.  Exercise regularly. Ask your health care provider to suggest some good activities for you.  Use compression stockings or other mechanical devices as directed by your health care provider.  Take good care of your feet. ? Wear comfortable shoes that fit well. ? Check your feet often for any cuts or sores. Contact a health care provider if:  You have cramps in your legs while walking.  You have leg pain when you are at rest.  You have coldness in a leg or foot.  Your skin changes.  You have erectile dysfunction.  You have cuts or sores on your feet that are not healing. Get help right away if:  Your arm or leg turns cold and blue.  Your arms or legs become red, warm, swollen, painful, or numb.  You have chest pain or trouble breathing.  You suddenly have weakness in your face, arm, or leg.  You become very confused or lose the ability to speak.  You suddenly have a very bad headache or lose your vision. This information is not intended to replace advice given to you by your health care provider. Make sure you discuss any questions you have with your health care provider. Document Released: 10/16/2004 Document Revised: 02/14/2016 Document Reviewed:  02/16/2014 Elsevier Interactive Patient Education  2017 [place]. Dayna Geurts M.D.

## 2018-03-18 ENCOUNTER — Encounter: Payer: Self-pay | Admitting: Internal Medicine

## 2018-03-18 ENCOUNTER — Ambulatory Visit (INDEPENDENT_AMBULATORY_CARE_PROVIDER_SITE_OTHER): Payer: Medicare Other | Admitting: Internal Medicine

## 2018-03-18 VITALS — BP 128/68 | Temp 97.8°F | Wt 126.0 lb

## 2018-03-18 DIAGNOSIS — R6889 Other general symptoms and signs: Secondary | ICD-10-CM

## 2018-03-18 DIAGNOSIS — R2241 Localized swelling, mass and lump, right lower limb: Secondary | ICD-10-CM

## 2018-03-18 DIAGNOSIS — M19042 Primary osteoarthritis, left hand: Secondary | ICD-10-CM

## 2018-03-18 DIAGNOSIS — M19041 Primary osteoarthritis, right hand: Secondary | ICD-10-CM

## 2018-03-18 NOTE — Patient Instructions (Signed)
You exam is good but want you to see the foot  Doctor  .  Let us know if we need to do a referral.  For this .   Pulses look good and  No evidence today of   Artery insufficiency   But if get   Sx of pad and will reexamine     At your  cpx  .     Peripheral Vascular Disease Peripheral vascular disease (PVD) is a disease of the blood vessels that are not part of your heart and brain. A simple term for PVD is poor circulation. In most cases, PVD narrows the blood vessels that carry blood from your heart to the rest of your body. This can result in a decreased supply of blood to your arms, legs, and internal organs, like your stomach or kidneys. However, it most often affects a person's lower legs and feet. There are two types of PVD.  Organic PVD. This is the more common type. It is caused by damage to the structure of blood vessels.  Functional PVD. This is caused by conditions that make blood vessels contract and tighten (spasm).  Without treatment, PVD tends to get worse over time. PVD can also lead to acute ischemic limb. This is when an arm or limb suddenly has trouble getting enough blood. This is a medical emergency. What are the causes? Each type of PVD has many different causes. The most common cause of PVD is buildup of a fatty material (plaque) inside of your arteries (atherosclerosis). Small amounts of plaque can break off from the walls of the blood vessels and become lodged in a smaller artery. This blocks blood flow and can cause acute ischemic limb. Other common causes of PVD include:  Blood clots that form inside of blood vessels.  Injuries to blood vessels.  Diseases that cause inflammation of blood vessels or cause blood vessel spasms.  Health behaviors and health history that increase your risk of developing PVD.  What increases the risk? You may have a greater risk of PVD if you:  Have a family history of PVD.  Have certain medical conditions, including: ? High  cholesterol. ? Diabetes. ? High blood pressure (hypertension). ? Coronary heart disease. ? Past problems with blood clots. ? Past injury, such as burns or a broken bone. These may have damaged blood vessels in your limbs. ? Buerger disease. This is caused by inflamed blood vessels in your hands and feet. ? Some forms of arthritis. ? Rare birth defects that affect the arteries in your legs.  Use tobacco.  Do not get enough exercise.  Are obese.  Are age 36 or older.  What are the signs or symptoms? PVD may cause many different symptoms. Your symptoms depend on what part of your body is not getting enough blood. Some common signs and symptoms include:  Cramps in your lower legs. This may be a symptom of poor leg circulation (claudication).  Pain and weakness in your legs while you are physically active that goes away when you rest (intermittent claudication).  Leg pain when at rest.  Leg numbness, tingling, or weakness.  Coldness in a leg or foot, especially when compared with the other leg.  Skin or hair changes. These can include: ? Hair loss. ? Shiny skin. ? Pale or bluish skin. ? Thick toenails.  Inability to get or maintain an erection (erectile dysfunction).  People with PVD are more prone to developing ulcers and sores on their toes, feet,  or legs. These may take longer than normal to heal. How is this diagnosed? Your health care provider may diagnose PVD from your signs and symptoms. The health care provider will also do a physical exam. You may have tests to find out what is causing your PVD and determine its severity. Tests may include:  Blood pressure recordings from your arms and legs and measurements of the strength of your pulses (pulse volume recordings).  Imaging studies using sound waves to take pictures of the blood flow through your blood vessels (Doppler ultrasound).  Injecting a dye into your blood vessels before having imaging studies  using: ? X-rays (angiogram or arteriogram). ? Computer-generated X-rays (CT angiogram). ? A powerful electromagnetic field and a computer (magnetic resonance angiogram or MRA).  How is this treated? Treatment for PVD depends on the cause of your condition and the severity of your symptoms. It also depends on your age. Underlying causes need to be treated and controlled. These include long-lasting (chronic) conditions, such as diabetes, high cholesterol, and high blood pressure. You may need to first try making lifestyle changes and taking medicines. Surgery may be needed if these do not work. Lifestyle changes may include:  Quitting smoking.  Exercising regularly.  Following a low-fat, low-cholesterol diet.  Medicines may include:  Blood thinners to prevent blood clots.  Medicines to improve blood flow.  Medicines to improve your blood cholesterol levels.  Surgical procedures may include:  A procedure that uses an inflated balloon to open a blocked artery and improve blood flow (angioplasty).  A procedure to put in a tube (stent) to keep a blocked artery open (stent implant).  Surgery to reroute blood flow around a blocked artery (peripheral bypass surgery).  Surgery to remove dead tissue from an infected wound on the affected limb.  Amputation. This is surgical removal of the affected limb. This may be necessary in cases of acute ischemic limb that are not improved through medical or surgical treatments.  Follow these instructions at home:  Take medicines only as directed by your health care provider.  Do not use any tobacco products, including cigarettes, chewing tobacco, or electronic cigarettes. If you need help quitting, ask your health care provider.  Lose weight if you are overweight, and maintain a healthy weight as directed by your health care provider.  Eat a diet that is low in fat and cholesterol. If you need help, ask your health care provider.  Exercise  regularly. Ask your health care provider to suggest some good activities for you.  Use compression stockings or other mechanical devices as directed by your health care provider.  Take good care of your feet. ? Wear comfortable shoes that fit well. ? Check your feet often for any cuts or sores. Contact a health care provider if:  You have cramps in your legs while walking.  You have leg pain when you are at rest.  You have coldness in a leg or foot.  Your skin changes.  You have erectile dysfunction.  You have cuts or sores on your feet that are not healing. Get help right away if:  Your arm or leg turns cold and blue.  Your arms or legs become red, warm, swollen, painful, or numb.  You have chest pain or trouble breathing.  You suddenly have weakness in your face, arm, or leg.  You become very confused or lose the ability to speak.  You suddenly have a very bad headache or lose your vision. This information is  not intended to replace advice given to you by your health care provider. Make sure you discuss any questions you have with your health care provider. Document Released: 10/16/2004 Document Revised: 02/14/2016 Document Reviewed: 02/16/2014 Elsevier Interactive Patient Education  2017 [employer].

## 2018-04-16 DIAGNOSIS — Z6821 Body mass index (BMI) 21.0-21.9, adult: Secondary | ICD-10-CM | POA: Diagnosis not present

## 2018-04-16 DIAGNOSIS — Z1231 Encounter for screening mammogram for malignant neoplasm of breast: Secondary | ICD-10-CM | POA: Diagnosis not present

## 2018-04-20 ENCOUNTER — Other Ambulatory Visit: Payer: Self-pay | Admitting: Obstetrics and Gynecology

## 2018-04-20 DIAGNOSIS — R928 Other abnormal and inconclusive findings on diagnostic imaging of breast: Secondary | ICD-10-CM

## 2018-04-23 ENCOUNTER — Other Ambulatory Visit: Payer: Self-pay | Admitting: Internal Medicine

## 2018-04-26 ENCOUNTER — Ambulatory Visit: Payer: Medicare Other

## 2018-04-26 ENCOUNTER — Ambulatory Visit
Admission: RE | Admit: 2018-04-26 | Discharge: 2018-04-26 | Disposition: A | Discharge status: Home / Self Care | Payer: Medicare Other | Source: Ambulatory Visit | Attending: Obstetrics and Gynecology | Admitting: Obstetrics and Gynecology

## 2018-04-26 DIAGNOSIS — R922 Inconclusive mammogram: Secondary | ICD-10-CM | POA: Diagnosis not present

## 2018-04-26 DIAGNOSIS — R928 Other abnormal and inconclusive findings on diagnostic imaging of breast: Secondary | ICD-10-CM

## 2018-04-29 DIAGNOSIS — M2042 Other hammer toe(s) (acquired), left foot: Secondary | ICD-10-CM | POA: Diagnosis not present

## 2018-04-29 DIAGNOSIS — B353 Tinea pedis: Secondary | ICD-10-CM | POA: Diagnosis not present

## 2018-04-29 DIAGNOSIS — M2041 Other hammer toe(s) (acquired), right foot: Secondary | ICD-10-CM | POA: Diagnosis not present

## 2018-04-29 DIAGNOSIS — L309 Dermatitis, unspecified: Secondary | ICD-10-CM | POA: Diagnosis not present

## 2018-05-10 ENCOUNTER — Other Ambulatory Visit: Payer: Medicare Other

## 2018-05-13 ENCOUNTER — Ambulatory Visit: Payer: Medicare Other | Admitting: Family

## 2018-05-28 DIAGNOSIS — H6123 Impacted cerumen, bilateral: Secondary | ICD-10-CM | POA: Diagnosis not present

## 2018-06-07 ENCOUNTER — Inpatient Hospital Stay: Payer: Medicare Other | Attending: Hematology & Oncology

## 2018-06-07 DIAGNOSIS — Z79899 Other long term (current) drug therapy: Secondary | ICD-10-CM | POA: Diagnosis not present

## 2018-06-07 DIAGNOSIS — Z7982 Long term (current) use of aspirin: Secondary | ICD-10-CM | POA: Insufficient documentation

## 2018-06-07 LAB — CBC WITH DIFFERENTIAL (CANCER CENTER ONLY)
BASOS ABS: 0 10*3/uL (ref 0.0–0.1)
BASOS PCT: 1 %
EOS ABS: 0.1 10*3/uL (ref 0.0–0.5)
Eosinophils Relative: 3 %
HCT: 34.4 % — ABNORMAL LOW (ref 34.8–46.6)
Hemoglobin: 11.5 g/dL — ABNORMAL LOW (ref 11.6–15.9)
Lymphocytes Relative: 29 %
Lymphs Abs: 1.1 10*3/uL (ref 0.9–3.3)
MCH: 33.8 pg (ref 26.0–34.0)
MCHC: 33.4 g/dL (ref 32.0–36.0)
MCV: 101.2 fL — ABNORMAL HIGH (ref 81.0–101.0)
MONO ABS: 0.4 10*3/uL (ref 0.1–0.9)
MONOS PCT: 9 %
NEUTROS PCT: 58 %
Neutro Abs: 2.2 10*3/uL (ref 1.5–6.5)
PLATELETS: 236 10*3/uL (ref 145–400)
RBC: 3.4 MIL/uL — ABNORMAL LOW (ref 3.70–5.32)
RDW: 11.3 % (ref 11.1–15.7)
WBC Count: 3.8 10*3/uL — ABNORMAL LOW (ref 3.9–10.0)

## 2018-06-07 LAB — CMP (CANCER CENTER ONLY)
ALK PHOS: 57 U/L (ref 38–126)
ALT: 9 U/L (ref 0–44)
AST: 17 U/L (ref 15–41)
Albumin: 4 g/dL (ref 3.5–5.0)
Anion gap: 9 (ref 5–15)
BILIRUBIN TOTAL: 0.4 mg/dL (ref 0.3–1.2)
BUN: 14 mg/dL (ref 8–23)
CALCIUM: 10.1 mg/dL (ref 8.9–10.3)
CO2: 28 mmol/L (ref 22–32)
CREATININE: 0.81 mg/dL (ref 0.44–1.00)
Chloride: 102 mmol/L (ref 98–111)
GFR, Estimated: >60 mL/min (ref >60)
GLUCOSE: 95 mg/dL (ref 70–99)
Potassium: 4.5 mmol/L (ref 3.5–5.1)
Sodium: 139 mmol/L (ref 135–145)
TOTAL PROTEIN: 7.2 g/dL (ref 6.5–8.1)

## 2018-06-07 LAB — FERRITIN: Ferritin: 5 ng/mL — ABNORMAL LOW (ref 11–307)

## 2018-06-07 LAB — IRON AND TIBC
IRON: 91 ug/dL (ref 41–142)
Saturation Ratios: 35 % (ref 21–57)
TIBC: 263 ug/dL (ref 236–444)
UIBC: 171 ug/dL

## 2018-06-09 NOTE — Progress Notes (Deleted)
Office Visit Note  Patient: Angela Burgess             Date of Birth: [birth year]           MRN: [id number]             PCP: Burnis Medin, MD Referring: Burnis Medin, MD Visit Date: 06/23/2018 Occupation: @GUAROCC @  Subjective:  No chief complaint on file.   History of Present Illness: Angela Burgess is a 77 y.o. female ***   Activities of Daily Living:  Patient reports morning stiffness for *** {minute/hour:19697}.   Patient {ACTIONS;DENIES/REPORTS:21021675::"Denies"} nocturnal pain.  Difficulty dressing/grooming: {ACTIONS;DENIES/REPORTS:21021675::"Denies"} Difficulty climbing stairs: {ACTIONS;DENIES/REPORTS:21021675::"Denies"} Difficulty getting out of chair: {ACTIONS;DENIES/REPORTS:21021675::"Denies"} Difficulty using hands for taps, buttons, cutlery, and/or writing: {ACTIONS;DENIES/REPORTS:21021675::"Denies"}  No Rheumatology ROS completed.   PMFS History:  Patient Active Problem List   Diagnosis Date Noted  . Primary osteoarthritis of both hands 12/20/2016  . Primary osteoarthritis of both knees 12/20/2016  . Primary osteoarthritis of both feet 12/20/2016  . Hypothyroidism 09/05/2014  . Hyperlipidemia 09/05/2014  . Medicare annual wellness visit, subsequent 09/05/2014  . Lumbar spondylosis 07/07/2014  . 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 Visit for preventive health examination 08/31/2013  . Hair loss 01/20/2013  . Elevated MCV 07/22/2012  . Preventative health care 04/02/2011  . IRRITABLE BOWEL SYNDROME 07/10/2010  . GASTRITIS 07/04/2010  . RHINITIS 05/21/2010  . VERTIGO 05/21/2010  . VARICOSE VEINS, LOWER EXTREMITIES 11/13/2009  . OTHER DISEASES OF NASAL CAVITY AND SINUSES 01/31/2008  . HYPOTHYROIDISM 08/31/2007  . HYPERLIPIDEMIA 08/31/2007  . OTHER SPEC DISEASES BLOOD&BLOOD-FORMING ORGANS 08/31/2007  . ROSACEA 08/31/2007  . Osteoarthritis of right hip 08/31/2007  . OSTEOPENIA 08/31/2007    Past Medical History:  Diagnosis Date  . Anemia   .  Arthritis   . Cervical cancer ([place]) 1968  . Dexamethasone adverse reaction 2008  . Elevated MCV    for years  . Eye exam abnormal 6/09  . Family history of malignant neoplasm of gastrointestinal tract   . Gastritis   . History of mammogram 1/10  . History of normal resting EKG 2005  . Hyperlipidemia   . Hypothyroidism   . IBS (irritable bowel syndrome)   . Macrocytosis    with theme eval in past and normal b12  . Osteopenia   . Rectocele   . Rosacea   . Tetanus-diphtheria (Td) vaccination 1999    Family History  Problem Relation Age of Onset  . Cirrhosis Mother   . Kidney disease Mother   . Allergy (severe) Mother        protein allergy that sent posion to her brain  . Leukemia Father   . Heart attack Father 40  . Diabetes Father   . Bipolar disorder Sister   . Stroke Sister   . Lung cancer Brother        smoker  . Seizures Brother   . Bipolar disorder Son   . Colon cancer Brother   . Seizures Sister   . Seizures Sister   . Breast cancer Maternal Aunt   . Breast cancer Cousin    Past Surgical History:  Procedure Laterality Date  . ABDOMINAL HYSTERECTOMY    . APPENDECTOMY    . BREAST EXCISIONAL BIOPSY Right    Lump Removed   . 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   Physical Exam   Musculoskeletal Exam: ***  CDAI Exam: CDAI Score: Not documented Patient Global Assessment: Not documented; Provider Global Assessment: Not documented Swollen: Not documented; Tender: Not documented Joint Exam   Not documented   There is currently no information documented on the homunculus. Go to the Rheumatology activity and complete the homunculus joint exam.  Investigation: No additional findings.  Imaging: No results  found.  Recent Labs: Lab Results  Component Value Date   WBC 3.8 (L) 06/07/2018   HGB 11.5 (L) 06/07/2018   PLT 236 06/07/2018   NA 139 06/07/2018   K 4.5 06/07/2018   CL 102 06/07/2018   CO2 28 06/07/2018   GLUCOSE 95 06/07/2018   BUN 14 06/07/2018   CREATININE 0.81 06/07/2018   BILITOT 0.4 06/07/2018   ALKPHOS 57 06/07/2018   AST 17 06/07/2018   ALT 9 06/07/2018   PROT 7.2 06/07/2018   ALBUMIN 4.0 06/07/2018   CALCIUM 10.1 06/07/2018   GFRAA >60 06/07/2018    Speciality Comments: No specialty comments available.  Procedures:  No procedures performed Allergies: Tetracycline   Assessment / Plan:     Visit Diagnoses: Hereditary hemochromatosis ([place]) - She has a phlebotomy every 6 months, and she follows up with Dr. Marin Olp every 3 months.    Primary osteoarthritis of both hands  Primary osteoarthritis of both knees  Primary osteoarthritis of both feet  Primary osteoarthritis of right hip  DDD (degenerative disc disease), lumbar  Osteopenia of multiple sites  History of hypothyroidism  History of hyperlipidemia  Rosacea  History of IBS   Orders: No orders of the defined types were placed in this encounter.  No orders of the defined types were placed in this encounter.   Face-to-face time spent with patient was *** minutes. Greater than 50% of time was spent in counseling and coordination of care.  Follow-Up Instructions: No follow-ups on file.   Ofilia Neas, PA-C  Note - This record has been created using Dragon software.  Chart creation errors have been sought, but may not always  have been located. Such creation errors do not reflect on  the standard of medical care.

## 2018-06-10 ENCOUNTER — Inpatient Hospital Stay: Payer: Medicare Other | Admitting: Family

## 2018-06-10 ENCOUNTER — Other Ambulatory Visit: Payer: Self-pay

## 2018-06-10 ENCOUNTER — Encounter: Payer: Self-pay | Admitting: Family

## 2018-06-10 ENCOUNTER — Inpatient Hospital Stay: Payer: Medicare Other

## 2018-06-10 DIAGNOSIS — Z79899 Other long term (current) drug therapy: Secondary | ICD-10-CM

## 2018-06-10 DIAGNOSIS — Z7982 Long term (current) use of aspirin: Secondary | ICD-10-CM

## 2018-06-10 NOTE — Progress Notes (Signed)
Hematology and Oncology Follow Up Visit  Angela Burgess [id number] 1941-02-25 77 y.o. 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 Her ferritin earlier this week was 5 and iron saturation 35% so phlebotomy needed at this time.  She had some light headedness after her last phlebotomy but it resolved after a couple days. She had no other issues.  No fever, chills, n/v, cough, rash, dizziness, SOB, chest pain, palpitations, abdominal pain or changes in bowel or bladder habits.  No falls or syncopal episodes to report.  She is still under a good bit of stress caring for her husband who has bladder cancer.  No swelling, tenderness, numbness or tingling in her extremities. No c/o pain.  No lymphadenopathy noted on exam.  No episodes of bleeding, no bruising or petechiae.  She has maintained a good appetite and is staying well hydrated. Her weight is stable.   ECOG Performance Status: 0 - Asymptomatic  Medications:  Allergies as of 06/10/2018      Reactions   Tetracycline Rash      Medication List        Accurate as of 06/10/18  9:20 AM. Always use your most recent med list.          aspirin 81 MG tablet Take 81 mg by mouth daily.   Astaxanthin 4 MG Caps Take 1 capsule by mouth 2 (two) times daily.   Calcium 500/D 500-125 MG-UNIT Tabs Generic drug:  Calcium Carbonate-Vitamin D Take by mouth.   cetirizine 10 MG tablet Commonly known as:  ZYRTEC Take by mouth.   diclofenac sodium 1 % Gel Commonly known as:  VOLTAREN 3 grams to 3 large joints up to three times a day   estradiol 0.1 MG/GM vaginal cream Commonly known as:  ESTRACE Place 1 Applicatorful vaginally 2 (two) times a week.   fish oil-omega-3 fatty acids 1000 MG capsule Take 2 g by mouth daily.   ketoconazole 2 %  cream Commonly known as:  NIZORAL APP EXT AA OF FUNGUS BID PRN   levothyroxine 112 MCG tablet Commonly known as:  SYNTHROID, LEVOTHROID TAKE 1 TABLET BY MOUTH EVERY DAY   metroNIDAZOLE 0.75 % cream Commonly known as:  METROCREAM Apply 1 application topically daily.   multivitamin tablet Take 1 tablet by mouth daily.   triamcinolone cream 0.1 % Commonly known as:  KENALOG APP AA OF FOOT BID PRN FOR ITCHING   Vitamin D3 2000 units Tabs Take by mouth every morning.       Allergies:  Allergies  Allergen Reactions  . Tetracycline Rash    Past Medical History, Surgical history, Social history, and Family History were reviewed and updated.  Review of Systems: All other 10 point review of systems is negative.   Physical Exam:  vitals were not taken for this visit.   Wt Readings from Last 3 Encounters:  03/18/18 126 lb (57.2 kg)  03/11/18 126 lb (57.2 kg)  12/22/17 130 lb (59 kg)    Ocular: Sclerae unicteric, pupils equal, round and reactive to light Ear-nose-throat: Oropharynx clear, dentition fair Lymphatic: No cervical, supraclavicular or axillary adenopathy Lungs no rales or rhonchi, good excursion bilaterally Heart regular rate and rhythm, no murmur appreciated Abd soft, nontender, positive bowel sounds, no liver or spleen tip palpated on exam, no fluid wave  MSK  no focal spinal tenderness, no joint edema Neuro: non-focal, well-oriented, appropriate affect Breasts: Deferred   Lab Results  Component Value Date   WBC 3.8 (L) 06/07/2018   HGB 11.5 (L) 06/07/2018   HCT 34.4 (L) 06/07/2018   MCV 101.2 (H) 06/07/2018   PLT 236 06/07/2018   Lab Results  Component Value Date   FERRITIN 5 (L) 06/07/2018   IRON 91 06/07/2018   TIBC 263 06/07/2018   UIBC 171 06/07/2018   IRONPCTSAT 35 06/07/2018   Lab Results  Component Value Date   RETICCTPCT 1.4 06/13/2015   RBC 3.40 (L) 06/07/2018   RETICCTABS 47.6 06/13/2015   No results found for: KPAFRELGTCHN,  LAMBDASER, KAPLAMBRATIO No results found for: IGGSERUM, IGA, IGMSERUM No results found for: Odetta Pink, SPEI   Chemistry      Component Value Date/Time   NA 139 06/07/2018 0918   NA 145 08/24/2017 1052   NA 138 01/27/2017 1122   K 4.5 06/07/2018 0918   K 3.9 08/24/2017 1052   K 4.6 01/27/2017 1122   CL 102 06/07/2018 0918   CL 102 08/24/2017 1052   CO2 28 06/07/2018 0918   CO2 31 08/24/2017 1052   CO2 29 01/27/2017 1122   BUN 14 06/07/2018 0918   BUN 16 08/24/2017 1052   BUN 14.6 01/27/2017 1122   CREATININE 0.81 06/07/2018 0918   CREATININE 1.1 08/24/2017 1052   CREATININE 0.8 01/27/2017 1122      Component Value Date/Time   CALCIUM 10.1 06/07/2018 0918   CALCIUM 9.5 08/24/2017 1052   CALCIUM 9.7 01/27/2017 1122   ALKPHOS 57 06/07/2018 0918   ALKPHOS 65 08/24/2017 1052   ALKPHOS 58 01/27/2017 1122   AST 17 06/07/2018 0918   AST 18 01/27/2017 1122   ALT 9 06/07/2018 0918   ALT 18 08/24/2017 1052   ALT 13 01/27/2017 1122   BILITOT 0.4 06/07/2018 0918   BILITOT 0.42 01/27/2017 1122      Impression and Plan: Angela Burgess is a very pleasant 77 yo caucasian female with hemochromatosis, homozygous for C282Y mutation. Her counts remain stable and she has no complaints at this time.  No phlebotomy needed this visit.  We will plan to see her back in another 3 months for follow-up.  She will contact our office with any questions or concerns. We can certainly see her sooner if need be.   [name], NP 9/19/20199:20 AM

## 2018-06-21 NOTE — Progress Notes (Signed)
Office Visit Note  Patient: Angela Burgess             Date of Birth: December 20, 1940           MRN: [id number]             PCP: Burnis Medin, MD Referring: Burnis Medin, MD Visit Date: 07/01/2018 Occupation: _0 @  Subjective:  Right hip and right knee pain   History of Present Illness: Angela Burgess is a 77 y.o. female with history of hereditary hemochromatosis, osteoarthritis, and DDD.  Patient reports that she has been having increased discomfort in her right knee joint and right hip.  She would like an x-ray of her knee and hip today.  She states that she does not follow-up with an orthopedist on a regular basis.  She states she continues to go to tai chi twice a week.  She states that she has some achiness in bilateral hands but denies any joint swelling.  She states she continues to follow-up with Dr. Marin Olp every 3 months and has a phlebotomy every 6 months.  She states her last phlebotomy was 4 months ago.    Activities of Daily Living:  Patient reports morning stiffness for 0 minutes.   Patient Reports nocturnal pain.  Difficulty dressing/grooming: Denies Difficulty climbing stairs: Denies Difficulty getting out of chair: Denies Difficulty using hands for taps, buttons, cutlery, and/or writing: Denies  Review of Systems  Constitutional: Negative for fatigue.  HENT: Negative for mouth sores, trouble swallowing, trouble swallowing, mouth dryness and nose dryness.   Eyes: Negative for pain, redness, visual disturbance and dryness.  Respiratory: Negative for cough, hemoptysis, shortness of breath, wheezing and difficulty breathing.   Cardiovascular: Negative for chest pain, palpitations, hypertension and swelling in legs/feet.  Gastrointestinal: Negative for abdominal pain, blood in stool, constipation, diarrhea, nausea and vomiting.  Endocrine: Negative for increased urination.  Genitourinary: Negative for painful urination, nocturia and pelvic pain.    Musculoskeletal: Positive for arthralgias, joint pain and joint swelling. Negative for myalgias, muscle weakness, morning stiffness, muscle tenderness and myalgias.  Skin: Negative for color change, pallor, rash, hair loss, nodules/bumps, skin tightness, ulcers and sensitivity to sunlight.  Allergic/Immunologic: Negative for susceptible to infections.  Neurological: Negative for dizziness, light-headedness, numbness, headaches, memory loss and weakness.  Hematological: Negative for bruising/bleeding tendency and swollen glands.  Psychiatric/Behavioral: Negative for depressed mood, confusion and sleep disturbance. The patient is not nervous/anxious.     PMFS History:  Patient Active Problem List   Diagnosis Date Noted  . Primary osteoarthritis of both hands 12/20/2016  . Primary osteoarthritis of both knees 12/20/2016  . Primary osteoarthritis of both feet 12/20/2016  . Hypothyroidism 09/05/2014  . Hyperlipidemia 09/05/2014  . Medicare annual wellness visit, subsequent 09/05/2014  . Lumbar spondylosis 07/07/2014  . 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 Visit for preventive health examination 08/31/2013  . Hair loss 01/20/2013  . Elevated MCV 07/22/2012  . Preventative health care 04/02/2011  . IRRITABLE BOWEL SYNDROME 07/10/2010  . GASTRITIS 07/04/2010  . RHINITIS 05/21/2010  . VERTIGO 05/21/2010  . VARICOSE VEINS, LOWER EXTREMITIES 11/13/2009  . OTHER DISEASES OF NASAL CAVITY AND SINUSES 01/31/2008  . HYPOTHYROIDISM 08/31/2007  . HYPERLIPIDEMIA 08/31/2007  . OTHER SPEC DISEASES BLOOD&BLOOD-FORMING ORGANS 08/31/2007  . ROSACEA 08/31/2007  . Osteoarthritis of right hip 08/31/2007  . OSTEOPENIA 08/31/2007    Past Medical History:  Diagnosis Date  . Anemia   . Arthritis   . Cervical cancer ([place]) 1968  . Dexamethasone  adverse reaction 2008  . Elevated MCV    for years  . Eye exam abnormal 6/09  . Family history of malignant neoplasm of gastrointestinal tract   . Gastritis   .  History of mammogram 1/10  . History of normal resting EKG 2005  . Hyperlipidemia   . Hypothyroidism   . IBS (irritable bowel syndrome)   . Macrocytosis    with theme eval in past and normal b12  . Osteopenia   . Rectocele   . Rosacea   . Tetanus-diphtheria (Td) vaccination 1999    Family History  Problem Relation Age of Onset  . Cirrhosis Mother   . Kidney disease Mother   . Allergy (severe) Mother        protein allergy that sent posion to her brain  . Leukemia Father   . Heart attack Father 60  . Diabetes Father   . Bipolar disorder Sister   . Stroke Sister   . Lung cancer Brother        smoker  . Seizures Brother   . Bipolar disorder Son   . Colon cancer Brother   . Seizures Sister   . Seizures Sister   . Breast cancer Maternal Aunt   . Breast cancer Cousin    Past Surgical History:  Procedure Laterality Date  . ABDOMINAL HYSTERECTOMY    . APPENDECTOMY    . BREAST EXCISIONAL BIOPSY Right    Lump Removed   . 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 She appears well-developed and well-nourished.  HENT:  Head: Normocephalic and atraumatic.  Eyes: Conjunctivae and EOM are normal.  Neck: Normal range of motion.  Cardiovascular: Normal rate, regular rhythm, normal heart sounds and intact distal pulses.  Pulmonary/Chest: Effort normal and breath sounds normal.  Abdominal: Soft. Bowel sounds are normal.  Lymphadenopathy:    She has no cervical adenopathy.  Neurological: She is alert and oriented to person,  place, and time.  Skin: Skin is warm and dry. Capillary refill takes less than 2 seconds.  Psychiatric: She has a normal mood and affect. Her behavior is normal.  Nursing note and vitals reviewed.    Musculoskeletal Exam: C-spine good range of motion.  Mild thoracic kyphosis noted.  Good range of motion of lumbar spine.  No midline spinal tenderness.  No SI joint tenderness.  Shoulder joints, elbow joints, wrist joints, MCPs, PIPs, DIPs good range of motion with no synovitis.  She has synovial thickening of bilateral second third MCP joints.  She has PIP and DIP synovial thickening consistent with osteoarthritis of bilateral hands.  Right hip limited range of motion with discomfort.  Left hip full range of motion with no discomfort.  Slightly limited extension of the right knee joint.  Bilateral knee crepitus.  No warmth or effusion noted of bilateral knee joints.  No tenderness or swelling of ankle joints.  No  Achilles tendinitis or plantar fasciitis.  CDAI Exam: CDAI Score: Not documented Patient Global Assessment: Not documented; Provider Global Assessment: Not documented Swollen: Not documented; Tender: Not documented Joint Exam   Not documented   There is currently no information documented on the homunculus. Go to the Rheumatology activity and complete the homunculus joint exam.  Investigation: No additional findings.  Imaging: Xr Hip Unilat W Or W/o Pelvis 2-3 Views Right  Result Date: 07/01/2018 Flattening of femoral head.  Multiple cystic changes.  Periarticular spurring with impingement noted.  Osteitis pubis noted.  Pessary present. Impression: Severe right hip osteoarthritis.    Xr Knee 3 View Right  Result Date: 07/01/2018 Moderate medial compartment joint space narrowing. Intercondylar notch spurring.  Chondrocalcinosis noted.  Moderate patellofemoral joint space narrowing.  Impression: Moderate osteoarthritis and moderate chondromalacia patella. Chondrocalcinosis present.     Recent Labs: Lab Results  Component Value Date   WBC 3.8 (L) 06/07/2018   HGB 11.5 (L) 06/07/2018   PLT 236 06/07/2018   NA 139 06/07/2018   K 4.5 06/07/2018   CL 102 06/07/2018   CO2 28 06/07/2018   GLUCOSE 95 06/07/2018   BUN 14 06/07/2018   CREATININE 0.81 06/07/2018   BILITOT 0.4 06/07/2018   ALKPHOS 57 06/07/2018   AST 17 06/07/2018   ALT 9 06/07/2018   PROT 7.2 06/07/2018   ALBUMIN 4.0 06/07/2018   CALCIUM 10.1 06/07/2018   GFRAA >60 06/07/2018    Speciality Comments: No specialty comments available.  Procedures:  No procedures performed Allergies: Tetracycline   Assessment / Plan:     Visit Diagnoses: Hereditary hemochromatosis ([place]) - She sees Dr. Marin Olp every 3 months and has a phlebotomy every 6 months.  Her last phlebotomy was about 4 months ago.  She has synovial thickening of bilateral second and third MCP joints consistent with hemochromatosis..  No synovitis was noted.  Primary osteoarthritis of both hands: She has PIP and DIP synovial thickening consistent with osteoarthritis of bilateral hands.  She is complete fist formation bilaterally.  She has no synovitis.  Joint protection and muscle strengthening were discussed.  Primary osteoarthritis of both knees: No warmth or effusion.  She has good range of motion of the left knee joint.  She has a limited range of motion of the right knee.  She requested an x-ray of the right knee joints that she is been having increased discomfort.  She has moderate medial compartment joint space narrowing and moderate chondromalacia patella.  Chondrocalcinosis noted.  Due to not having severe pain, warmth, effusion, or erythema it does not appear that she is having a pseudogout flare.  She declined a cortisone injection.  We discussed the signs of a pseudogout flare.  She was advised to notify us if she develops increased joint pain or joint swelling.   Primary osteoarthritis of both feet: She has osteoarthritic changes in  bilateral feet.  She is no discomfort at this time.  She was proper fitting shoes.  Primary osteoarthritis of right hip: She has limited range of motion with discomfort on exam.  She requested an x-ray of her right hip.  X-ray of the right hip revealed severe osteoarthritis. We discussed the possibly of a referral to an orthopedist to discuss a right hip intraarticular cortisone injection vs. A total right hip arthroplasty.  She would like to do further research   Pain in right hip -She has limited range of motion of the right hip.  She is been having increased pain discomfort  and limited range of motion.  She requested an x-ray of her left hip today. X-ray revealed severe osteoarthritis with multiple cystic changes.  A referral to orthopedics for further evaluation will be placed when the patient decides where she would to be evaluated. Plan: XR HIP UNILAT W OR W/O PELVIS 2-3 VIEWS RIGHT  Chronic pain of right knee -She is been having increased discomfort in her right knee joint.  She has slightly limited extension with knee crepitus.  No warmth or effusion noted.  She requested an x-ray of her right knee joint today. Moderate medial compartment narrowing noted.  Chondrocalcinosis present. She declined a right knee cortisone injection. Plan: XR KNEE 3 VIEW RIGHT   DDD (degenerative disc disease), lumbar: No midline spinal tenderness.  Good range of motion.  She has chronic pain in her lower back to  Osteopenia of multiple sites: She takes calcium and vitamin D.  She also goes to tai chi twice a week.  Other medical conditions are listed as follows:  History of hypothyroidism  History of hyperlipidemia  Rosacea  History of IBS   Orders: Orders Placed This Encounter  Procedures  . XR HIP UNILAT W OR W/O PELVIS 2-3 VIEWS RIGHT  . XR KNEE 3 VIEW RIGHT   No orders of the defined types were placed in this encounter.   Face-to-face time spent with patient was 30 minutes. Greater than 50% of  time was spent in counseling and coordination of care.  Follow-Up Instructions: Return in about 6 months (around 12/31/2018) for Hereditary hemochromatosis, DDD, Osteoarthritis.   Ofilia Neas, PA-C  Note - This record has been created using Dragon software.  Chart creation errors have been sought, but may not always  have been located. Such creation errors do not reflect on  the standard of medical care.

## 2018-06-23 ENCOUNTER — Ambulatory Visit: Payer: Self-pay | Admitting: Physician Assistant

## 2018-07-01 ENCOUNTER — Ambulatory Visit (INDEPENDENT_AMBULATORY_CARE_PROVIDER_SITE_OTHER): Payer: Self-pay

## 2018-07-01 ENCOUNTER — Encounter: Payer: Self-pay | Admitting: Physician Assistant

## 2018-07-01 ENCOUNTER — Ambulatory Visit: Payer: Medicare Other | Admitting: Physician Assistant

## 2018-07-01 DIAGNOSIS — M19041 Primary osteoarthritis, right hand: Secondary | ICD-10-CM | POA: Diagnosis not present

## 2018-07-01 DIAGNOSIS — M5136 Other intervertebral disc degeneration, lumbar region: Secondary | ICD-10-CM

## 2018-07-01 DIAGNOSIS — M25551 Pain in right hip: Secondary | ICD-10-CM

## 2018-07-01 DIAGNOSIS — M19072 Primary osteoarthritis, left ankle and foot: Secondary | ICD-10-CM

## 2018-07-01 DIAGNOSIS — G8929 Other chronic pain: Secondary | ICD-10-CM

## 2018-07-01 DIAGNOSIS — M1611 Unilateral primary osteoarthritis, right hip: Secondary | ICD-10-CM

## 2018-07-01 DIAGNOSIS — M19071 Primary osteoarthritis, right ankle and foot: Secondary | ICD-10-CM | POA: Diagnosis not present

## 2018-07-01 DIAGNOSIS — L719 Rosacea, unspecified: Secondary | ICD-10-CM

## 2018-07-01 DIAGNOSIS — Z8639 Personal history of other endocrine, nutritional and metabolic disease: Secondary | ICD-10-CM

## 2018-07-01 DIAGNOSIS — M170 Bilateral primary osteoarthritis of knee: Secondary | ICD-10-CM | POA: Diagnosis not present

## 2018-07-01 DIAGNOSIS — Z8719 Personal history of other diseases of the digestive system: Secondary | ICD-10-CM

## 2018-07-01 DIAGNOSIS — M8589 Other specified disorders of bone density and structure, multiple sites: Secondary | ICD-10-CM

## 2018-07-01 DIAGNOSIS — M51369 Other intervertebral disc degeneration, lumbar region without mention of lumbar back pain or lower extremity pain: Secondary | ICD-10-CM

## 2018-07-01 DIAGNOSIS — M19042 Primary osteoarthritis, left hand: Secondary | ICD-10-CM

## 2018-07-01 DIAGNOSIS — M25561 Pain in right knee: Secondary | ICD-10-CM

## 2018-09-06 ENCOUNTER — Other Ambulatory Visit: Payer: Medicare Other

## 2018-09-09 ENCOUNTER — Ambulatory Visit: Payer: Medicare Other | Admitting: Family

## 2018-10-18 ENCOUNTER — Inpatient Hospital Stay: Payer: Medicare Other | Attending: Hematology & Oncology

## 2018-10-18 DIAGNOSIS — R5383 Other fatigue: Secondary | ICD-10-CM | POA: Insufficient documentation

## 2018-10-18 LAB — CBC WITH DIFFERENTIAL (CANCER CENTER ONLY)
Abs Immature Granulocytes: 0 10*3/uL (ref 0.00–0.07)
BASOS ABS: 0 10*3/uL (ref 0.0–0.1)
BASOS PCT: 1 %
EOS ABS: 0.1 10*3/uL (ref 0.0–0.5)
EOS PCT: 2 %
HCT: 35.8 % — ABNORMAL LOW (ref 36.0–46.0)
Hemoglobin: 12.5 g/dL (ref 12.0–15.0)
Immature Granulocytes: 0 %
LYMPHS PCT: 24 %
Lymphs Abs: 1 10*3/uL (ref 0.7–4.0)
MCH: 36 pg — ABNORMAL HIGH (ref 26.0–34.0)
MCHC: 34.9 g/dL (ref 30.0–36.0)
MCV: 103.2 fL — ABNORMAL HIGH (ref 80.0–100.0)
Monocytes Absolute: 0.3 10*3/uL (ref 0.1–1.0)
Monocytes Relative: 7 %
NRBC: 0 % (ref 0.0–0.2)
Neutro Abs: 2.8 10*3/uL (ref 1.7–7.7)
Neutrophils Relative %: 66 %
PLATELETS: 219 10*3/uL (ref 150–400)
RBC: 3.47 MIL/uL — AB (ref 3.87–5.11)
RDW: 11.7 % (ref 11.5–15.5)
WBC: 4.1 10*3/uL (ref 4.0–10.5)

## 2018-10-18 LAB — CMP (CANCER CENTER ONLY)
ALT: 10 U/L (ref 0–44)
ANION GAP: 5 (ref 5–15)
AST: 15 U/L (ref 15–41)
Albumin: 4.4 g/dL (ref 3.5–5.0)
Alkaline Phosphatase: 56 U/L (ref 38–126)
BUN: 16 mg/dL (ref 8–23)
CO2: 31 mmol/L (ref 22–32)
Calcium: 10 mg/dL (ref 8.9–10.3)
Chloride: 101 mmol/L (ref 98–111)
Creatinine: 0.71 mg/dL (ref 0.44–1.00)
GFR, Est AFR Am: >60 mL/min (ref >60)
GFR, Estimated: >60 mL/min (ref >60)
GLUCOSE: 97 mg/dL (ref 70–99)
POTASSIUM: 4.2 mmol/L (ref 3.5–5.1)
SODIUM: 137 mmol/L (ref 135–145)
Total Bilirubin: 0.5 mg/dL (ref 0.3–1.2)
Total Protein: 6.8 g/dL (ref 6.5–8.1)

## 2018-10-18 LAB — FERRITIN: Ferritin: 18 ng/mL (ref 11–307)

## 2018-10-18 LAB — IRON AND TIBC
Iron: 154 ug/dL — ABNORMAL HIGH (ref 41–142)
SATURATION RATIOS: 67 % — AB (ref 21–57)
TIBC: 230 ug/dL — AB (ref 236–444)
UIBC: 76 ug/dL — AB (ref 120–384)

## 2018-10-19 ENCOUNTER — Other Ambulatory Visit: Payer: Self-pay | Admitting: Internal Medicine

## 2018-10-21 ENCOUNTER — Inpatient Hospital Stay: Payer: Medicare Other

## 2018-10-21 ENCOUNTER — Inpatient Hospital Stay: Payer: Medicare Other | Admitting: Family

## 2018-10-21 ENCOUNTER — Encounter: Payer: Self-pay | Admitting: Family

## 2018-10-21 DIAGNOSIS — R5383 Other fatigue: Secondary | ICD-10-CM | POA: Diagnosis not present

## 2018-10-21 MED ORDER — SODIUM CHLORIDE 0.9 % IV SOLN
Freq: Once | INTRAVENOUS | Status: AC
  Administered 2018-10-21: 11:00:00 via INTRAVENOUS
  Filled 2018-10-21: qty 250

## 2018-10-21 NOTE — Progress Notes (Signed)
Hematology and Oncology Follow Up Visit  Angela Burgess [id number] 1941-08-15 78 y.o. 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 Unfortunately her husband passed away 2 months ago and this has been hard on her.  She is considering moving to [place] to be close to her sisters. She feels this would be towards the end of the year or early next year.  She is symptomatic with some fatigue.  Her iron saturation last week was 67%, total iron 154 and ferritin 18.  She has generalized aches and pain in her joints due to arthritidis.  No swelling, numbness or tingling in her extremities.  No lymphadenopathy noted on exam.  No fever, chills, n/v, cough, rash, dizziness, SOB, chest pain, palpitations, abdominal pain or changes in bowel or bladder habits.  She has maintained a good appetite and is staying well hydrated. Her weight is stable.   ECOG Performance Status: 1 - Symptomatic but completely ambulatory  Medications:  Allergies as of 10/21/2018      Reactions   Tetracycline Rash      Medication List       Accurate as of October 21, 2018 10:48 AM. Always use your most recent med list.        aspirin 81 MG tablet Take 81 mg by mouth daily.   Astaxanthin 4 MG Caps Take 1 capsule by mouth 2 (two) times daily.   Calcium 500/D 500-125 MG-UNIT Tabs Generic drug:  Calcium Carbonate-Vitamin D Take by mouth.   cetirizine 10 MG tablet Commonly known as:  ZYRTEC Take by mouth.   diclofenac sodium 1 % Gel Commonly known as:  VOLTAREN 3 grams to 3 large joints up to three times a day   estradiol 0.1 MG/GM vaginal cream Commonly known as:  ESTRACE Place 1 Applicatorful vaginally 2 (two) times a week.   fish oil-omega-3 fatty acids 1000 MG capsule Take 2 g by mouth daily.     levothyroxine 112 MCG tablet Commonly known as:  SYNTHROID, LEVOTHROID TAKE 1 TABLET BY MOUTH EVERY DAY   metroNIDAZOLE 0.75 % cream Commonly known as:  METROCREAM Apply 1 application topically daily.   multivitamin tablet Take 1 tablet by mouth daily.   Vitamin D3 50 MCG (2000 UT) Tabs Take by mouth every morning.       Allergies:  Allergies  Allergen Reactions  . Tetracycline Rash    Past Medical History, Surgical history, Social history, and Family History were reviewed and updated.  Review of Systems: All other 10 point review of systems is negative.   Physical Exam:  height is 5\' 4"  (1.626 m) and weight is 117 lb 12.8 oz (53.4 kg). Her oral temperature is 97.7 F (36.5 C). Her blood pressure is 160/57 (abnormal) and her pulse is 70. Her respiration is 16 and oxygen saturation is 100%.   Wt Readings from Last 3 Encounters:  10/21/18 117 lb 12.8 oz (53.4 kg)  07/01/18 122 lb 12.8 oz (55.7 kg)  06/10/18 122 lb (55.3 kg)    Ocular: Sclerae unicteric, pupils equal, round and reactive to light Ear-nose-throat: Oropharynx clear, dentition fair Lymphatic: No cervical, supraclavicular or axillary adenopathy Lungs no rales or rhonchi, good excursion bilaterally Heart regular rate and rhythm, no murmur appreciated Abd soft, nontender, positive bowel sounds, no liver or spleen tip  palpated on exam, no fluid wave  MSK no focal spinal tenderness, no joint edema Neuro: non-focal, well-oriented, appropriate affect Breasts: Deferred   Lab Results  Component Value Date   WBC 4.1 10/18/2018   HGB 12.5 10/18/2018   HCT 35.8 (L) 10/18/2018   MCV 103.2 (H) 10/18/2018   PLT 219 10/18/2018   Lab Results  Component Value Date   FERRITIN 18 10/18/2018   IRON 154 (H) 10/18/2018   TIBC 230 (L) 10/18/2018   UIBC 76 (L) 10/18/2018   IRONPCTSAT 67 (H) 10/18/2018   Lab Results  Component Value Date   RETICCTPCT 1.4 06/13/2015   RBC 3.47 (L) 10/18/2018   RETICCTABS 47.6  06/13/2015   No results found for: KPAFRELGTCHN, LAMBDASER, KAPLAMBRATIO No results found for: IGGSERUM, IGA, IGMSERUM No results found for: Odetta Pink, SPEI   Chemistry      Component Value Date/Time   NA 137 10/18/2018 0949   NA 145 08/24/2017 1052   NA 138 01/27/2017 1122   K 4.2 10/18/2018 0949   K 3.9 08/24/2017 1052   K 4.6 01/27/2017 1122   CL 101 10/18/2018 0949   CL 102 08/24/2017 1052   CO2 31 10/18/2018 0949   CO2 31 08/24/2017 1052   CO2 29 01/27/2017 1122   BUN 16 10/18/2018 0949   BUN 16 08/24/2017 1052   BUN 14.6 01/27/2017 1122   CREATININE 0.71 10/18/2018 0949   CREATININE 1.1 08/24/2017 1052   CREATININE 0.8 01/27/2017 1122      Component Value Date/Time   CALCIUM 10.0 10/18/2018 0949   CALCIUM 9.5 08/24/2017 1052   CALCIUM 9.7 01/27/2017 1122   ALKPHOS 56 10/18/2018 0949   ALKPHOS 65 08/24/2017 1052   ALKPHOS 58 01/27/2017 1122   AST 15 10/18/2018 0949   AST 18 01/27/2017 1122   ALT 10 10/18/2018 0949   ALT 18 08/24/2017 1052   ALT 13 01/27/2017 1122   BILITOT 0.5 10/18/2018 0949   BILITOT 0.42 01/27/2017 1122       Impression and Plan: Angela Burgess is a very pleasant 78 yo caucasian female with hemochromatosis, homozygous for C282Y mutation.  We will proceed with phlebotomy today.  We will plan to see her back in another 4 months for follow-up.  She will contact our office with any questions or concerns. We can certainly see her sooner if need be.   [name], NP 1/30/202010:48 AM

## 2018-10-21 NOTE — Patient Instructions (Signed)

## 2018-10-21 NOTE — Progress Notes (Signed)
Angela Burgess presents today for phlebotomy per MD orders. Phlebotomy procedure started at 1108 and ended at 1123 508 grams removed. Patient observed for 30 minutes after procedure without any incident. Patient tolerated procedure well. IV needle removed intact.

## 2018-10-28 ENCOUNTER — Ambulatory Visit: Payer: Medicare Other | Admitting: Internal Medicine

## 2018-10-29 ENCOUNTER — Ambulatory Visit (INDEPENDENT_AMBULATORY_CARE_PROVIDER_SITE_OTHER): Payer: Medicare Other | Admitting: Internal Medicine

## 2018-10-29 ENCOUNTER — Encounter: Payer: Self-pay | Admitting: Internal Medicine

## 2018-10-29 VITALS — BP 130/68 | HR 63 | Temp 98.5°F | Wt 115.7 lb

## 2018-10-29 DIAGNOSIS — E039 Hypothyroidism, unspecified: Secondary | ICD-10-CM | POA: Diagnosis not present

## 2018-10-29 DIAGNOSIS — R0989 Other specified symptoms and signs involving the circulatory and respiratory systems: Secondary | ICD-10-CM

## 2018-10-29 DIAGNOSIS — E785 Hyperlipidemia, unspecified: Secondary | ICD-10-CM

## 2018-10-29 DIAGNOSIS — Z79899 Other long term (current) drug therapy: Secondary | ICD-10-CM

## 2018-10-29 LAB — TSH: TSH: 1.17 u[IU]/mL (ref 0.35–4.50)

## 2018-10-29 LAB — LIPID PANEL
Cholesterol: 223 mg/dL — ABNORMAL HIGH (ref 0–200)
HDL: 63.1 mg/dL (ref >39.00)
LDL Cholesterol: 147 mg/dL — ABNORMAL HIGH (ref 0–99)
NonHDL: 159.66
Total CHOL/HDL Ratio: 4
Triglycerides: 61 mg/dL (ref 0.0–149.0)
VLDL: 12.2 mg/dL (ref 0.0–40.0)

## 2018-10-29 NOTE — Progress Notes (Signed)
Chief Complaint  Patient presents with  . Follow-up    Pt states that home health said she might have a blocked artery and wanted to get that checked     HPI: Angela Burgess 78 y.o. come in for sent in by [facility] insurance nurse because she heard a faint sound in her right neck.  She has no neurologic cardiovascular symptoms. Her husband passed away November [id number] complications of prostate bladder cancer and treatments with secondary bleeding. Sis in [place]     Active problem is her hemochromatosis gene and intermittent phlebotomy. ROS: See pertinent positives and negatives per HPI.  taie chi and stretch .     Walking limited by ms cause . No  cva sx some sensation   right frontal dull sensation.  Denies claudication per se active heart disease.  Sis had and bro with cva    Not major.  Hx   Father  Had mi   Older .    Past Medical History:  Diagnosis Date  . Anemia   . Arthritis   . Cervical cancer ([place]) 1968  . Dexamethasone adverse reaction 2008  . Elevated MCV    for years  . Eye exam abnormal 6/09  . Family history of malignant neoplasm of gastrointestinal tract   . Gastritis   . History of mammogram 1/10  . History of normal resting EKG 2005  . Hyperlipidemia   . Hypothyroidism   . IBS (irritable bowel syndrome)   . Macrocytosis    with theme eval in past and normal b12  . Osteopenia   . Rectocele   . Rosacea   . Tetanus-diphtheria (Td) vaccination 1999    Family History  Problem Relation Age of Onset  . Cirrhosis Mother   . Kidney disease Mother   . Allergy (severe) Mother        protein allergy that sent posion to her brain  . Leukemia Father   . Heart attack Father 59  . Diabetes Father   . Bipolar disorder Sister   . Stroke Sister   . Lung cancer Brother        smoker  . Seizures Brother   . Bipolar disorder Son   . Colon cancer Brother   . Seizures Sister   . Seizures Sister   . Breast cancer Maternal Aunt   . Breast cancer Cousin     Social History    Socioeconomic History  . Marital status: Married    Spouse name: Not on file  . Number of children: 2  . Years of education: Not on file  . Highest education level: Not on file  Occupational History  . Occupation: retired  Scientific laboratory technician  . Financial resource strain: Not on file  . Food insecurity:    Worry: Not on file    Inability: Not on file  . Transportation needs:    Medical: Not on file    Non-medical: Not on file  Tobacco Use  . Smoking status: Never Smoker  . Smokeless tobacco: Never Used  . Tobacco comment: never used tobacco  Substance and Sexual Activity  . Alcohol use: Not Currently    Alcohol/week: 0.0 standard drinks    Comment: none now   . Drug use: Never  . Sexual activity: Not on file  Lifestyle  . Physical activity:    Days per week: Not on file    Minutes per session: Not on file  . Stress: Not on file  Relationships  .  Social connections:    Talks on phone: Not on file    Gets together: Not on file    Attends religious service: Not on file    Active member of club or organization: Not on file    Attends meetings of clubs or organizations: Not on file    Relationship status: Not on file  Other Topics Concern  . 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 aspirin 81 MG tablet Take 81 mg by mouth daily.    . Astaxanthin 4 MG CAPS Take 1 capsule by mouth 2 (two) times daily.    . Calcium Carbonate-Vitamin D (CALCIUM 500/D) 500-125 MG-UNIT TABS Take by mouth.      . cetirizine (ZYRTEC) 10 MG tablet Take by mouth.    . Cholecalciferol (VITAMIN D3) 2000 UNITS TABS Take by mouth every morning.    . diclofenac sodium (VOLTAREN) 1 % GEL 3 grams to 3 large joints up to three times a day 3 Tube 3  . estradiol (ESTRACE) 0.1 MG/GM vaginal cream Place 1 Applicatorful vaginally 2 (two) times a week.       . fish oil-omega-3 fatty acids 1000 MG capsule Take 2 g by mouth daily.      [name] levothyroxine (SYNTHROID, LEVOTHROID) 112 MCG tablet TAKE 1 TABLET BY MOUTH EVERY DAY 90 tablet 1  . metroNIDAZOLE (METROCREAM) 0.75 % cream Apply 1 application topically daily.      . Multiple Vitamin (MULTIVITAMIN) tablet Take 1 tablet by mouth daily.       No facility-administered medications prior to visit.      EXAM:  BP 130/68 (BP Location: Right Arm, Patient Position: Sitting, Cuff Size: Normal)   Pulse 63   Temp 98.5 F (36.9 C) (Oral)   Wt 115 lb 11.2 oz (52.5 kg)   BMI 19.86 kg/m   Body mass index is 19.86 kg/m.  GENERAL: vitals reviewed and listed above, alert, oriented, appears well hydrated and in no acute distress HEENT: atraumatic, conjunctiva  clear, no obvious abnormalities on inspection of external nose and ears OP : no lesion edema or exudate  NECK: no obvious masses on inspection palpation there is a very faint and systolic bruit at the base of the right carotid otherwise pulses are present no bruits are heard. LUNGS: clear to auscultation bilaterally, no wheezes, rales or rhonchi, good air movement CV: HRRR, no clubbing cyanosis or  peripheral edema nl cap refill  Abdomen soft without organomegaly guarding rebound no bruits are heard. MS: moves all extremities without noticeable focal  abnormality PSYCH: pleasant and cooperative, no obvious depression or anxiety Lab Results  Component Value Date   WBC 4.1 10/18/2018   HGB 12.5 10/18/2018   HCT 35.8 (L) 10/18/2018   PLT 219 10/18/2018   GLUCOSE 97 10/18/2018   CHOL 223 (H) 10/29/2018   TRIG 61.0 10/29/2018   HDL 63.10 10/29/2018   LDLDIRECT 165.2 07/21/2012   LDLCALC 147 (H) 10/29/2018   ALT 10 10/18/2018   AST 15 10/18/2018   NA 137 10/18/2018   K 4.2 10/18/2018   CL 101 10/18/2018   CREATININE 0.71 10/18/2018   BUN 16 10/18/2018   CO2 31 10/18/2018   TSH 1.17 10/29/2018   HGBA1C 5.1 09/18/2015   BP Readings from  Last  3 Encounters:  10/29/18 130/68  10/21/18 (!) 148/43  10/21/18 (!) 160/57   Wt Readings from Last 3 Encounters:  10/29/18 115 lb 11.2 oz (52.5 kg)  10/21/18 117 lb 12.8 oz (53.4 kg)  07/01/18 122 lb 12.8 oz (55.7 kg)     ASSESSMENT AND PLAN:  Discussed the following assessment and plan:  Bruit of right carotid artery - Plan: VAS US CAROTID  Hyperlipidemia, unspecified hyperlipidemia type - Plan: Lipid panel, TSH, VAS US CAROTID  Hypothyroidism, unspecified type - Plan: Lipid panel, TSH  Medication management - Plan: Lipid panel, TSH No symptoms of vascular disease discussed risk benefit of asymptomatic screening when here bruit may not be significant however in the end of injury interventions are really related to cardiovascular risk reduction unless severe obstruction.  Which I doubt. Shared decision making go ahead and order the vascular Doppler check her thyroid and lipids today she is pretty much fasting. Condolences about her husband loss She is coping well.  As best expected. -Patient advised to return or notify health care team  if  new concerns arise. .50 Patient Instructions   The sound is called a bruit  But you have no  symptoms  of artery disease . optimization of  BP and cholesterol in the intervention  Advised  wil be notified about   Doppler    of the artery    Mariann Laster K. Ivania Teagarden M.D.

## 2018-10-29 NOTE — Patient Instructions (Addendum)
  The sound is called a bruit  But you have no  symptoms  of artery disease . optimization of  BP and cholesterol in the intervention  Advised  wil be notified about   Doppler    of the artery

## 2018-11-05 ENCOUNTER — Ambulatory Visit (HOSPITAL_COMMUNITY)
Admission: RE | Admit: 2018-11-05 | Discharge: 2018-11-05 | Disposition: A | Discharge status: Home / Self Care | Payer: Medicare Other | Source: Ambulatory Visit | Attending: Vascular Surgery | Admitting: Vascular Surgery

## 2018-11-05 DIAGNOSIS — R0989 Other specified symptoms and signs involving the circulatory and respiratory systems: Secondary | ICD-10-CM | POA: Diagnosis not present

## 2018-11-05 DIAGNOSIS — E785 Hyperlipidemia, unspecified: Secondary | ICD-10-CM | POA: Insufficient documentation

## 2018-12-30 ENCOUNTER — Ambulatory Visit: Payer: Self-pay | Admitting: Physician Assistant

## 2018-12-31 ENCOUNTER — Ambulatory Visit: Payer: Self-pay | Admitting: Physician Assistant

## 2019-02-01 DIAGNOSIS — R42 Dizziness and giddiness: Secondary | ICD-10-CM | POA: Diagnosis not present

## 2019-02-01 DIAGNOSIS — H6123 Impacted cerumen, bilateral: Secondary | ICD-10-CM | POA: Diagnosis not present

## 2019-02-01 DIAGNOSIS — H903 Sensorineural hearing loss, bilateral: Secondary | ICD-10-CM | POA: Diagnosis not present

## 2019-02-02 NOTE — Progress Notes (Deleted)
Office Visit Note  Patient: Angela Burgess             Date of Birth: [birth year]           MRN: [id number]             PCP: Burnis Medin, MD Referring: Burnis Medin, MD Visit Date: 02/10/2019 Occupation: @GUAROCC @  Subjective:  No chief complaint on file.   History of Present Illness: Angela Burgess is a 78 y.o. female ***   Activities of Daily Living:  Patient reports morning stiffness for *** {minute/hour:19697}.   Patient {ACTIONS;DENIES/REPORTS:21021675::"Denies"} nocturnal pain.  Difficulty dressing/grooming: {ACTIONS;DENIES/REPORTS:21021675::"Denies"} Difficulty climbing stairs: {ACTIONS;DENIES/REPORTS:21021675::"Denies"} Difficulty getting out of chair: {ACTIONS;DENIES/REPORTS:21021675::"Denies"} Difficulty using hands for taps, buttons, cutlery, and/or writing: {ACTIONS;DENIES/REPORTS:21021675::"Denies"}  No Rheumatology ROS completed.   PMFS History:  Patient Active Problem List   Diagnosis Date Noted  . Primary osteoarthritis of both hands 12/20/2016  . Primary osteoarthritis of both knees 12/20/2016  . Primary osteoarthritis of both feet 12/20/2016  . Hypothyroidism 09/05/2014  . Hyperlipidemia 09/05/2014  . Medicare annual wellness visit, subsequent 09/05/2014  . Lumbar spondylosis 07/07/2014  . 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 Visit for preventive health examination 08/31/2013  . Hair loss 01/20/2013  . Elevated MCV 07/22/2012  . Preventative health care 04/02/2011  . IRRITABLE BOWEL SYNDROME 07/10/2010  . GASTRITIS 07/04/2010  . RHINITIS 05/21/2010  . VERTIGO 05/21/2010  . VARICOSE VEINS, LOWER EXTREMITIES 11/13/2009  . OTHER DISEASES OF NASAL CAVITY AND SINUSES 01/31/2008  . HYPOTHYROIDISM 08/31/2007  . HYPERLIPIDEMIA 08/31/2007  . OTHER SPEC DISEASES BLOOD&BLOOD-FORMING ORGANS 08/31/2007  . ROSACEA 08/31/2007  . Osteoarthritis of right hip 08/31/2007  . OSTEOPENIA 08/31/2007    Past Medical History:  Diagnosis Date  . Anemia   .  Arthritis   . Cervical cancer ([place]) 1968  . Dexamethasone adverse reaction 2008  . Elevated MCV    for years  . Eye exam abnormal 6/09  . Family history of malignant neoplasm of gastrointestinal tract   . Gastritis   . History of mammogram 1/10  . History of normal resting EKG 2005  . Hyperlipidemia   . Hypothyroidism   . IBS (irritable bowel syndrome)   . Macrocytosis    with theme eval in past and normal b12  . Osteopenia   . Rectocele   . Rosacea   . Tetanus-diphtheria (Td) vaccination 1999    Family History  Problem Relation Age of Onset  . Cirrhosis Mother   . Kidney disease Mother   . Allergy (severe) Mother        protein allergy that sent posion to her brain  . Leukemia Father   . Heart attack Father 22  . Diabetes Father   . Bipolar disorder Sister   . Stroke Sister   . Lung cancer Brother        smoker  . Seizures Brother   . Bipolar disorder Son   . Colon cancer Brother   . Seizures Sister   . Seizures Sister   . Breast cancer Maternal Aunt   . Breast cancer Cousin    Past Surgical History:  Procedure Laterality Date  . ABDOMINAL HYSTERECTOMY    . APPENDECTOMY    . BREAST EXCISIONAL BIOPSY Right    Lump Removed   . 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 Influenza Split 06/22/2013  . Influenza Whole 07/07/2007, 07/05/2008, 07/23/2009, 05/21/2010  . Influenza, High Dose Seasonal PF 08/03/2017, 08/29/2018  . Influenza,inj,Quad PF,6+ Mos 07/24/2016  . Influenza-Unspecified 07/06/2014  . Pneumococcal Conjugate-13 12/21/2013  . Pneumococcal Polysaccharide-23 01/31/2008  . Td 09/22/1997, 11/13/2009  . Zoster 05/21/2010     Objective: Vital Signs: There were no vitals taken for this visit.   Physical Exam    Musculoskeletal Exam: ***  CDAI Exam: CDAI Score: Not documented Patient Global Assessment: Not documented; Provider Global Assessment: Not documented Swollen: Not documented; Tender: Not documented Joint Exam   Not documented   There is currently no information documented on the homunculus. Go to the Rheumatology activity and complete the homunculus joint exam.  Investigation: No additional findings.  Imaging: No results found.  Recent Labs: Lab Results  Component Value Date   WBC 4.1 10/18/2018   HGB 12.5 10/18/2018   PLT 219 10/18/2018   NA 137 10/18/2018   K 4.2 10/18/2018   CL 101 10/18/2018   CO2 31 10/18/2018   GLUCOSE 97 10/18/2018   BUN 16 10/18/2018   CREATININE 0.71 10/18/2018   BILITOT 0.5 10/18/2018   ALKPHOS 56 10/18/2018   AST 15 10/18/2018   ALT 10 10/18/2018   PROT 6.8 10/18/2018   ALBUMIN 4.4 10/18/2018   CALCIUM 10.0 10/18/2018   GFRAA >60 10/18/2018    Speciality Comments: No specialty comments available.  Procedures:  No procedures performed Allergies: Tetracycline   Assessment / Plan:     Visit Diagnoses: No diagnosis found.   Orders: No orders of the defined types were placed in this encounter.  No orders of the defined types were placed in this encounter.   Face-to-face time spent with patient was *** minutes. Greater than 50% of time was spent in counseling and coordination of care.  Follow-Up Instructions: No follow-ups on file.   Ofilia Neas, PA-C  Note - This record has been created using Dragon software.  Chart creation errors have been sought, but may not always  have been located. Such creation errors do not reflect on  the standard of medical care.

## 2019-02-07 DIAGNOSIS — H26493 Other secondary cataract, bilateral: Secondary | ICD-10-CM | POA: Diagnosis not present

## 2019-02-07 DIAGNOSIS — Z961 Presence of intraocular lens: Secondary | ICD-10-CM | POA: Diagnosis not present

## 2019-02-07 DIAGNOSIS — H43813 Vitreous degeneration, bilateral: Secondary | ICD-10-CM | POA: Diagnosis not present

## 2019-02-10 ENCOUNTER — Ambulatory Visit: Payer: Self-pay | Admitting: Physician Assistant

## 2019-02-15 ENCOUNTER — Inpatient Hospital Stay: Payer: Medicare Other | Attending: Hematology & Oncology

## 2019-02-15 ENCOUNTER — Other Ambulatory Visit: Payer: Self-pay

## 2019-02-15 DIAGNOSIS — R21 Rash and other nonspecific skin eruption: Secondary | ICD-10-CM | POA: Insufficient documentation

## 2019-02-15 DIAGNOSIS — Z79899 Other long term (current) drug therapy: Secondary | ICD-10-CM | POA: Diagnosis not present

## 2019-02-15 DIAGNOSIS — R002 Palpitations: Secondary | ICD-10-CM | POA: Insufficient documentation

## 2019-02-15 LAB — CMP (CANCER CENTER ONLY)
ALT: 12 U/L (ref 0–44)
AST: 18 U/L (ref 15–41)
Albumin: 4.2 g/dL (ref 3.5–5.0)
Alkaline Phosphatase: 60 U/L (ref 38–126)
Anion gap: 8 (ref 5–15)
BUN: 15 mg/dL (ref 8–23)
CO2: 28 mmol/L (ref 22–32)
Calcium: 9.1 mg/dL (ref 8.9–10.3)
Chloride: 103 mmol/L (ref 98–111)
Creatinine: 0.76 mg/dL (ref 0.44–1.00)
GFR, Est AFR Am: >60 mL/min (ref >60)
GFR, Estimated: >60 mL/min (ref >60)
Glucose, Bld: 117 mg/dL — ABNORMAL HIGH (ref 70–99)
Potassium: 4.2 mmol/L (ref 3.5–5.1)
Sodium: 139 mmol/L (ref 135–145)
Total Bilirubin: 0.3 mg/dL (ref 0.3–1.2)
Total Protein: 6.3 g/dL — ABNORMAL LOW (ref 6.5–8.1)

## 2019-02-15 LAB — CBC WITH DIFFERENTIAL (CANCER CENTER ONLY)
Abs Immature Granulocytes: 0.01 10*3/uL (ref 0.00–0.07)
Basophils Absolute: 0 10*3/uL (ref 0.0–0.1)
Basophils Relative: 1 %
Eosinophils Absolute: 0.1 10*3/uL (ref 0.0–0.5)
Eosinophils Relative: 2 %
HCT: 34.2 % — ABNORMAL LOW (ref 36.0–46.0)
Hemoglobin: 11.7 g/dL — ABNORMAL LOW (ref 12.0–15.0)
Immature Granulocytes: 0 %
Lymphocytes Relative: 29 %
Lymphs Abs: 1.2 10*3/uL (ref 0.7–4.0)
MCH: 34.3 pg — ABNORMAL HIGH (ref 26.0–34.0)
MCHC: 34.2 g/dL (ref 30.0–36.0)
MCV: 100.3 fL — ABNORMAL HIGH (ref 80.0–100.0)
Monocytes Absolute: 0.3 10*3/uL (ref 0.1–1.0)
Monocytes Relative: 8 %
Neutro Abs: 2.5 10*3/uL (ref 1.7–7.7)
Neutrophils Relative %: 60 %
Platelet Count: 219 10*3/uL (ref 150–400)
RBC: 3.41 MIL/uL — ABNORMAL LOW (ref 3.87–5.11)
RDW: 11.8 % (ref 11.5–15.5)
WBC Count: 4.1 10*3/uL (ref 4.0–10.5)
nRBC: 0 % (ref 0.0–0.2)

## 2019-02-16 LAB — IRON AND TIBC
Iron: 108 ug/dL (ref 41–142)
Saturation Ratios: 48 % (ref 21–57)
TIBC: 226 ug/dL — ABNORMAL LOW (ref 236–444)
UIBC: 119 ug/dL — ABNORMAL LOW (ref 120–384)

## 2019-02-16 LAB — FERRITIN: Ferritin: 7 ng/mL — ABNORMAL LOW (ref 11–307)

## 2019-02-17 ENCOUNTER — Ambulatory Visit: Payer: Medicare Other | Admitting: Hematology & Oncology

## 2019-02-18 ENCOUNTER — Encounter: Payer: Self-pay | Admitting: Family

## 2019-02-18 ENCOUNTER — Inpatient Hospital Stay: Payer: Medicare Other | Admitting: Family

## 2019-02-18 ENCOUNTER — Other Ambulatory Visit: Payer: Self-pay

## 2019-02-18 DIAGNOSIS — R21 Rash and other nonspecific skin eruption: Secondary | ICD-10-CM

## 2019-02-18 DIAGNOSIS — Z79899 Other long term (current) drug therapy: Secondary | ICD-10-CM | POA: Diagnosis not present

## 2019-02-18 DIAGNOSIS — R002 Palpitations: Secondary | ICD-10-CM | POA: Diagnosis not present

## 2019-02-18 NOTE — Progress Notes (Signed)
Hematology and Oncology Follow Up Visit  Angela Burgess [id number] 01-Feb-1941 78 y.o. 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 Angela Burgess is here today for follow-up. She is doing quite well and staying busy getting her home ready for market.  Her ferritin is stable at 7 and iron saturation is 48%.  She has occasional light palpitations at night.  She plans to follow-up with her dermatologist regarding a patchy, itchy rash that pops up on her legs.  No fever, chills, n/v, cough, SOB, dizziness, chest pain, abdominal pain or changes in bowel or bladder habits.  No swelling, tenderness, numbness or tingling in her extremities.  No lymphadenopathy noted on exam.  No episodes of bleeding, no bruising or petechiae.  She states that she has a good appetite and is staying well hydrated. Her weight is stable.    ECOG Performance Status: 1 - Symptomatic but completely ambulatory  Medications:  Allergies as of 02/18/2019      Reactions   Tetracycline Rash      Medication List       Accurate as of Feb 18, 2019  8:14 AM. If you have any questions, ask your nurse or doctor.        aspirin 81 MG tablet Take 81 mg by mouth daily.   Astaxanthin 4 MG Caps Take 1 capsule by mouth 2 (two) times daily.   Calcium 500/D 500-125 MG-UNIT Tabs Generic drug:  Calcium Carbonate-Vitamin D Take by mouth.   cetirizine 10 MG tablet Commonly known as:  ZYRTEC Take by mouth.   diclofenac sodium 1 % Gel Commonly known as:  VOLTAREN 3 grams to 3 large joints up to three times a day   estradiol 0.1 MG/GM vaginal cream Commonly known as:  ESTRACE Place 1 Applicatorful vaginally 2 (two) times a week.   fish oil-omega-3 fatty acids 1000 MG capsule Take 2 g by mouth daily.   levothyroxine 112 MCG tablet Commonly known as:  SYNTHROID TAKE 1 TABLET  BY MOUTH EVERY DAY   metroNIDAZOLE 0.75 % cream Commonly known as:  METROCREAM Apply 1 application topically daily.   multivitamin tablet Take 1 tablet by mouth daily.   Vitamin D3 50 MCG (2000 UT) Tabs Take by mouth every morning.       Allergies:  Allergies  Allergen Reactions  . Tetracycline Rash    Past Medical History, Surgical history, Social history, and Family History were reviewed and updated.  Review of Systems: All other 10 point review of systems is negative.   Physical Exam:  vitals were not taken for this visit.   Wt Readings from Last 3 Encounters:  10/29/18 115 lb 11.2 oz (52.5 kg)  10/21/18 117 lb 12.8 oz (53.4 kg)  07/01/18 122 lb 12.8 oz (55.7 kg)    Ocular: Sclerae unicteric, pupils equal, round and reactive to light Ear-nose-throat: Oropharynx clear, dentition fair Lymphatic: No cervical or supraclavicular adenopathy Lungs no rales or rhonchi, good excursion bilaterally Heart regular rate and rhythm, no murmur appreciated Abd soft, nontender, positive bowel sounds, no liver or spleen tip palpated on exam, no fluid wave  MSK no focal spinal tenderness, no joint edema Neuro: non-focal, well-oriented, appropriate affect Breasts: Deferred   Lab Results  Component Value Date   WBC 4.1 02/15/2019   HGB 11.7 (L) 02/15/2019   HCT 34.2 (L) 02/15/2019  MCV 100.3 (H) 02/15/2019   PLT 219 02/15/2019   Lab Results  Component Value Date   FERRITIN 7 (L) 02/15/2019   IRON 108 02/15/2019   TIBC 226 (L) 02/15/2019   UIBC 119 (L) 02/15/2019   IRONPCTSAT 48 02/15/2019   Lab Results  Component Value Date   RETICCTPCT 1.4 06/13/2015   RBC 3.41 (L) 02/15/2019   RETICCTABS 47.6 06/13/2015   No results found for: KPAFRELGTCHN, LAMBDASER, KAPLAMBRATIO No results found for: Kandis Cocking, IGMSERUM No results found for: Odetta Pink, SPEI   Chemistry      Component Value Date/Time   NA 139  02/15/2019 1059   NA 145 08/24/2017 1052   NA 138 01/27/2017 1122   K 4.2 02/15/2019 1059   K 3.9 08/24/2017 1052   K 4.6 01/27/2017 1122   CL 103 02/15/2019 1059   CL 102 08/24/2017 1052   CO2 28 02/15/2019 1059   CO2 31 08/24/2017 1052   CO2 29 01/27/2017 1122   BUN 15 02/15/2019 1059   BUN 16 08/24/2017 1052   BUN 14.6 01/27/2017 1122   CREATININE 0.76 02/15/2019 1059   CREATININE 1.1 08/24/2017 1052   CREATININE 0.8 01/27/2017 1122      Component Value Date/Time   CALCIUM 9.1 02/15/2019 1059   CALCIUM 9.5 08/24/2017 1052   CALCIUM 9.7 01/27/2017 1122   ALKPHOS 60 02/15/2019 1059   ALKPHOS 65 08/24/2017 1052   ALKPHOS 58 01/27/2017 1122   AST 18 02/15/2019 1059   AST 18 01/27/2017 1122   ALT 12 02/15/2019 1059   ALT 18 08/24/2017 1052   ALT 13 01/27/2017 1122   BILITOT 0.3 02/15/2019 1059   BILITOT 0.42 01/27/2017 1122       Impression and Plan: [name] is a very pleasant 78 yo caucasian female with hemochromatosis, homozygous for C282Y mutation.  No phlebotomy needed this visit.  We will plan to see her back in another 3 months.  She will contact our office with any questions or concerns. We can certainly see her sooner if need be.   [name], NP 5/29/20208:14 AM

## 2019-02-22 DIAGNOSIS — H52203 Unspecified astigmatism, bilateral: Secondary | ICD-10-CM | POA: Diagnosis not present

## 2019-02-22 DIAGNOSIS — H02831 Dermatochalasis of right upper eyelid: Secondary | ICD-10-CM | POA: Diagnosis not present

## 2019-02-22 DIAGNOSIS — H527 Unspecified disorder of refraction: Secondary | ICD-10-CM | POA: Diagnosis not present

## 2019-02-22 DIAGNOSIS — H1851 Endothelial corneal dystrophy: Secondary | ICD-10-CM | POA: Diagnosis not present

## 2019-02-22 DIAGNOSIS — H26493 Other secondary cataract, bilateral: Secondary | ICD-10-CM | POA: Diagnosis not present

## 2019-02-22 DIAGNOSIS — H02834 Dermatochalasis of left upper eyelid: Secondary | ICD-10-CM | POA: Diagnosis not present

## 2019-03-07 DIAGNOSIS — H26492 Other secondary cataract, left eye: Secondary | ICD-10-CM | POA: Diagnosis not present

## 2019-03-14 NOTE — Progress Notes (Signed)
Office Visit Note  Patient: Angela Burgess             Date of Birth: 02-28-1941           MRN: [id number]             PCP: Burnis Medin, MD Referring: Burnis Medin, MD Visit Date: 03/28/2019 Occupation: @GUAROCC @  Subjective:  Lower back pain    History of Present Illness: Angela Burgess is a 78 y.o. female with history of hereditary hemochromatosis and osteoarthritis.  She continues to see her hematologist every 3 months.  At her last visit on 02/18/2019 she did not require a phlebotomy at that time.  She denies any shortness of breath recently.  She states that occasionally she has palpitations at night.  She states about 2 months ago she had an episode of dizziness that lasted several hours but after resting resolved.  She continues to stay hydrated and has a good appetite.  She has occasional pain in bilateral hands which is dependent on her level of activity.  She denies any joint swelling.  She continues to work in her yard and remains very active.  She uses Voltaren gel topically PRN.  She has intermittent lower back pain which is worse after sitting for prolonged peers of time or walking for prolonged periods of time.  She states that her back gets stiff in the evenings.  She was performing back exercises on a daily basis but stopped about 2 months ago.  She continues to perform tai chi on a daily basis.  She states that overall she has been feeling a lot better over the past several months.   Activities of Daily Living:  Patient reports morning stiffness for 0 minutes.   Patient Reports nocturnal pain.  Difficulty dressing/grooming: Denies Difficulty climbing stairs: Denies Difficulty getting out of chair: Denies Difficulty using hands for taps, buttons, cutlery, and/or writing: Reports  Review of Systems  Constitutional: Positive for fatigue.  HENT: Negative for mouth sores, mouth dryness and nose dryness.   Eyes: Positive for dryness. Negative for pain, itching and  visual disturbance.  Respiratory: Negative for cough, hemoptysis, shortness of breath and difficulty breathing.   Cardiovascular: Positive for palpitations. Negative for chest pain, hypertension and swelling in legs/feet.  Gastrointestinal: Negative for abdominal pain, blood in stool, constipation and diarrhea.  Endocrine: Negative for increased urination.  Genitourinary: Negative for painful urination and pelvic pain.  Musculoskeletal: Positive for arthralgias and joint pain. Negative for joint swelling, myalgias, muscle weakness, morning stiffness, muscle tenderness and myalgias.  Skin: Negative for color change, pallor, rash, hair loss, nodules/bumps, redness, skin tightness, ulcers and sensitivity to sunlight.  Allergic/Immunologic: Negative for susceptible to infections.  Neurological: Negative for dizziness, light-headedness, numbness, headaches, memory loss and weakness.  Hematological: Negative for bruising/bleeding tendency and swollen glands.  Psychiatric/Behavioral: Negative for depressed mood, confusion and sleep disturbance. The patient is not nervous/anxious.     PMFS History:  Patient Active Problem List   Diagnosis Date Noted  . Primary osteoarthritis of both hands 12/20/2016  . Primary osteoarthritis of both knees 12/20/2016  . Primary osteoarthritis of both feet 12/20/2016  . Hypothyroidism 09/05/2014  . Hyperlipidemia 09/05/2014  . Medicare annual wellness visit, subsequent 09/05/2014  . Lumbar spondylosis 07/07/2014  . 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 Visit for preventive health examination 08/31/2013  . Hair loss 01/20/2013  . Elevated MCV 07/22/2012  . Preventative health care 04/02/2011  . IRRITABLE BOWEL SYNDROME 07/10/2010  .  GASTRITIS 07/04/2010  . RHINITIS 05/21/2010  . VERTIGO 05/21/2010  . VARICOSE VEINS, LOWER EXTREMITIES 11/13/2009  . OTHER DISEASES OF NASAL CAVITY AND SINUSES 01/31/2008  . HYPOTHYROIDISM 08/31/2007  . HYPERLIPIDEMIA 08/31/2007  .  OTHER SPEC DISEASES BLOOD&BLOOD-FORMING ORGANS 08/31/2007  . ROSACEA 08/31/2007  . Osteoarthritis of right hip 08/31/2007  . OSTEOPENIA 08/31/2007    Past Medical History:  Diagnosis Date  . Anemia   . Arthritis   . Cervical cancer ([place]) 1968  . Dexamethasone adverse reaction 2008  . Elevated MCV    for years  . Eye exam abnormal 6/09  . Family history of malignant neoplasm of gastrointestinal tract   . Gastritis   . History of mammogram 1/10  . History of normal resting EKG 2005  . Hyperlipidemia   . Hypothyroidism   . IBS (irritable bowel syndrome)   . Macrocytosis    with theme eval in past and normal b12  . Osteopenia   . Rectocele   . Rosacea   . Tetanus-diphtheria (Td) vaccination 1999    Family History  Problem Relation Age of Onset  . Cirrhosis Mother   . Kidney disease Mother   . Allergy (severe) Mother        protein allergy that sent posion to her brain  . Leukemia Father   . Heart attack Father 28  . Diabetes Father   . Bipolar disorder Sister   . Stroke Sister   . Lung cancer Brother        smoker  . Seizures Brother   . Bipolar disorder Son   . Colon cancer Brother   . Seizures Sister   . Seizures Sister   . Breast cancer Maternal Aunt   . Breast cancer Cousin   . Lung cancer Cousin    Past Surgical History:  Procedure Laterality Date  . ABDOMINAL HYSTERECTOMY    . APPENDECTOMY    . BREAST EXCISIONAL BIOPSY Right    Lump Removed   . 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 Influenza Split 06/22/2013  . Influenza Whole 07/07/2007, 07/05/2008, 07/23/2009, 05/21/2010  . Influenza, High Dose Seasonal PF 08/03/2017, 08/29/2018  . Influenza,inj,Quad PF,6+ Mos 07/24/2016  . Influenza-Unspecified 07/06/2014  .  Pneumococcal Conjugate-13 12/21/2013  . Pneumococcal Polysaccharide-23 01/31/2008  . Td 09/22/1997, 11/13/2009  . Zoster 05/21/2010     Objective: Vital Signs: BP (!) 145/67 (BP Location: Left Arm, Patient Position: Sitting, Cuff Size: Normal)   Pulse 66   Resp 13   Ht 5' 4"  (1.626 m)   Wt 116 lb 3.2 oz (52.7 kg)   BMI 19.95 kg/m    Physical Exam Vitals signs and nursing note reviewed.  Constitutional:      Appearance: She is well-developed.  HENT:     Head: Normocephalic and atraumatic.  Eyes:     Conjunctiva/sclera: Conjunctivae normal.  Neck:     Musculoskeletal: Normal range of motion.  Cardiovascular:     Rate and Rhythm: Normal rate and regular rhythm.     Heart sounds: Normal heart sounds.  Pulmonary:     Effort: Pulmonary effort is normal.     Breath sounds: Normal breath sounds.  Abdominal:  General: Bowel sounds are normal.     Palpations: Abdomen is soft.  Lymphadenopathy:     Cervical: No cervical adenopathy.  Skin:    General: Skin is warm and dry.     Capillary Refill: Capillary refill takes less than 2 seconds.  Neurological:     Mental Status: She is alert and oriented to person, place, and time.  Psychiatric:        Behavior: Behavior normal.      Musculoskeletal Exam: C-spine slightly limited range of motion with lateral rotation.  Thoracic spine and lumbar spine have good range of motion.  No midline spinal tenderness.  No SI joint tenderness.  Shoulder joints, elbow joints, wrist joints, MCPs, PIPs and DIPs good range of motion. She has PIP and DIP synovial thickening consistent with osteoarthritis of bilateral hands.  She has synovial thickening of the right second and third MCP joints.  Synovial thickening of bilateral CMC joints.  Inflammation of the left 2nd and 3rd PIP joints and right 3rd PIP joint.  Hip joints have limited range of motion with some discomfort.  Tenderness over the right trochanter bursa.  Knee joints, ankle joints, MTPs,  PIPs and DIPs good range of motion no synovitis.  No warmth or effusion bilateral knee joints.  No tenderness or swelling of ankle joints.  CDAI Exam: CDAI Score: - Patient Global: -; Provider Global: - Swollen: 3 ; Tender: 3  Joint Exam      Right  Left  PIP 2     Swollen Tender  PIP 3  Swollen Tender  Swollen Tender     Investigation: No additional findings.  Imaging: No results found.  Recent Labs: Lab Results  Component Value Date   WBC 4.1 02/15/2019   HGB 11.7 (L) 02/15/2019   PLT 219 02/15/2019   NA 139 02/15/2019   K 4.2 02/15/2019   CL 103 02/15/2019   CO2 28 02/15/2019   GLUCOSE 117 (H) 02/15/2019   BUN 15 02/15/2019   CREATININE 0.76 02/15/2019   BILITOT 0.3 02/15/2019   ALKPHOS 60 02/15/2019   AST 18 02/15/2019   ALT 12 02/15/2019   PROT 6.3 (L) 02/15/2019   ALBUMIN 4.2 02/15/2019   CALCIUM 9.1 02/15/2019   GFRAA >60 02/15/2019    Speciality Comments: No specialty comments available.  Procedures:  No procedures performed Allergies: Tetracycline   Assessment / Plan:     Visit Diagnoses: Hereditary hemochromatosis ([place]) -She continues to follow-up with her hematologist, Dr. Marin Olp, every 3 months.  She typically has phlebotomies every 6 months.  Her last visit on 02/18/2019 she did not require phlebotomy at that time.  She has not had any shortness of breath recently but occasionally has palpitations at night.  She had one episode of dizziness that lasted several hours about 2 months ago.  She continues to stay hydrated has had a good appetite.  She will continue following up with her hematologist every 3 months.    Primary osteoarthritis of both hands -She has PIP and DIP synovial thickening consistent with osteoarthritis of bilateral hands.  She has inflammation of the left second and third PIP joints and right third PIP joint.  She experiences increased pain in bilateral hands depending on her level of activity.  She has been performing more yard work  and using clippers recently.  Joint protection and muscle strengthening were discussed.  We also discussed using Voltaren gel topically as needed for pain relief.  Primary osteoarthritis of both knees -  Chondrocalcinosis present: She has good ROM with no discomfort.  No warmth or effusion noted. She has not had any knee joint pain recently.  She remains very active and performs Tai Chi daily.  Primary osteoarthritis of both feet -She has no pain in her feet at this time.  She has no joint swelling.  She wears proper fitting shoes.  Primary osteoarthritis of right hip -  severe osteoarthritis with multiple cystic changes: She has limited ROM of the right hip joint on exam.  She has no discomfort at this time.   DDD (degenerative disc disease), lumbar -Chronic pain.  She experiences increased pain and stiffness in her lower back if she has been sitting for prolonged periods of time.  She has good range of motion with no discomfort.  No midline spinal tenderness.  No SI joint tenderness.  She was previously performing back exercises on a regular basis but discontinued that 2 months ago.  She was given a handout of back exercises and core strengthening exercises to perform.  She is advised to notify us if develops new or worsening symptoms.  Osteopenia of multiple sites - Her gynecologist orders DEXA.  She obtains calcium from her diet and is taking a vitamin D supplement.  Other medical conditions are listed as follows:   History of hypothyroidism   History of hyperlipidemia   Rosacea   History of IBS   Orders: No orders of the defined types were placed in this encounter.  No orders of the defined types were placed in this encounter.     Follow-Up Instructions: Return in about 6 months (around 09/28/2019) for Osteoarthritis, Hereditary hemochromatosis .   Ofilia Neas, PA-C  Note - This record has been created using Dragon software.  Chart creation errors have been sought, but may not  always  have been located. Such creation errors do not reflect on  the standard of medical care.

## 2019-03-17 DIAGNOSIS — L820 Inflamed seborrheic keratosis: Secondary | ICD-10-CM | POA: Diagnosis not present

## 2019-03-17 DIAGNOSIS — Z85828 Personal history of other malignant neoplasm of skin: Secondary | ICD-10-CM | POA: Diagnosis not present

## 2019-03-17 DIAGNOSIS — L821 Other seborrheic keratosis: Secondary | ICD-10-CM | POA: Diagnosis not present

## 2019-03-17 DIAGNOSIS — Z08 Encounter for follow-up examination after completed treatment for malignant neoplasm: Secondary | ICD-10-CM | POA: Diagnosis not present

## 2019-03-17 DIAGNOSIS — L218 Other seborrheic dermatitis: Secondary | ICD-10-CM | POA: Diagnosis not present

## 2019-03-28 ENCOUNTER — Encounter: Payer: Self-pay | Admitting: Physician Assistant

## 2019-03-28 ENCOUNTER — Other Ambulatory Visit: Payer: Self-pay

## 2019-03-28 ENCOUNTER — Ambulatory Visit (INDEPENDENT_AMBULATORY_CARE_PROVIDER_SITE_OTHER): Payer: Medicare Other | Admitting: Physician Assistant

## 2019-03-28 DIAGNOSIS — M170 Bilateral primary osteoarthritis of knee: Secondary | ICD-10-CM | POA: Diagnosis not present

## 2019-03-28 DIAGNOSIS — L719 Rosacea, unspecified: Secondary | ICD-10-CM

## 2019-03-28 DIAGNOSIS — Z8719 Personal history of other diseases of the digestive system: Secondary | ICD-10-CM

## 2019-03-28 DIAGNOSIS — Z8639 Personal history of other endocrine, nutritional and metabolic disease: Secondary | ICD-10-CM

## 2019-03-28 DIAGNOSIS — M1611 Unilateral primary osteoarthritis, right hip: Secondary | ICD-10-CM

## 2019-03-28 DIAGNOSIS — M19071 Primary osteoarthritis, right ankle and foot: Secondary | ICD-10-CM

## 2019-03-28 DIAGNOSIS — M19072 Primary osteoarthritis, left ankle and foot: Secondary | ICD-10-CM

## 2019-03-28 DIAGNOSIS — M8589 Other specified disorders of bone density and structure, multiple sites: Secondary | ICD-10-CM

## 2019-03-28 DIAGNOSIS — M19041 Primary osteoarthritis, right hand: Secondary | ICD-10-CM | POA: Diagnosis not present

## 2019-03-28 DIAGNOSIS — M19042 Primary osteoarthritis, left hand: Secondary | ICD-10-CM

## 2019-03-28 DIAGNOSIS — M5136 Other intervertebral disc degeneration, lumbar region: Secondary | ICD-10-CM

## 2019-03-28 NOTE — Patient Instructions (Signed)
Core Strength Exercises  Core exercises help to build strength in the muscles between your ribs and your hips (abdominal muscles). These muscles help to support your body and keep your spine stable. It is important to maintain strength in your core to prevent injury and pain. Some activities, such as yoga and Pilates, can help to strengthen core muscles. You can also strengthen core muscles with exercises at home. It is important to talk to your health care provider before you start a new exercise routine. What are the benefits of core strength exercises? Core strength exercises can:  Reduce back pain.  Help to rebuild strength after a back or spine injury.  Help to prevent injury during physical activity, especially injuries to the back and knees. How to do core strength exercises Repeat these exercises 10-15 times, or until you are tired. Do exercises exactly as told by your health care provider and adjust them as directed. It is normal to feel mild stretching, pulling, tightness, or discomfort as you do these exercises. If you feel any pain while doing these exercises, stop. If your pain continues or gets worse when doing core exercises, contact your health care provider. You may want to use a padded yoga or exercise mat for strength exercises that are done on the floor. Bridging  1. Lie on your back on a firm surface with your knees bent and your feet flat on the floor. 2. Raise your hips so that your knees, hips, and shoulders form a straight line together. Keep your abdominal muscles tight. 3. Hold this position for 3-5 seconds. 4. Slowly lower your hips to the starting position. 5. Let your muscles relax completely between repetitions. Single-leg bridge 1. Lie on your back on a firm surface with your knees bent and your feet flat on the floor. 2. Raise your hips so that your knees, hips, and shoulders form a straight line together. Keep your abdominal muscles tight. 3. Lift one foot  off the floor, then completely straighten that leg. 4. Hold this position for 3-5 seconds. 5. Put the straight leg back down in the bent position. 6. Slowly lower your hips to the starting position. 7. Repeat these steps using your other leg. Side bridge 1. Lie on your side with your knees bent. Prop yourself up on the elbow that is near the floor. 2. Using your abdominal muscles and your elbow that is on the floor, raise your body off the floor. Raise your hip so that your shoulder, hip, and foot form a straight line together. 3. Hold this position for 10 seconds. Keep your head and neck raised and away from your shoulder (in their normal, neutral position). Keep your abdominal muscles tight. 4. Slowly lower your hip to the starting position. 5. Repeat and try to hold this position longer, working your way up to 30 seconds. Abdominal crunch 1. Lie on your back on a firm surface. Bend your knees and keep your feet flat on the floor. 2. Cross your arms over your chest. 3. Without bending your neck, tip your chin slightly toward your chest. 4. Tighten your abdominal muscles as you lift your chest just high enough to lift your shoulder blades off of the floor. Do not hold your breath. You can do this with short lifts or long lifts. 5. Slowly return to the starting position. Bird dog 1. Get on your hands and knees, with your legs shoulder-width apart and your arms under your shoulders. Keep your back straight. 2. Tighten  your abdominal muscles. °3. Raise one of your legs off the floor and straighten it. Try to keep it parallel to the floor. °4. Slowly lower your leg to the starting position. °5. Raise one of your arms off the floor and straighten it. Try to keep it parallel to the floor. °6. Slowly lower your arm to the starting position. °7. Repeat with the other arm and leg. If possible, try raising a leg and arm at the same time, on opposite sides of the body. For example, raise your left hand and  your right leg. °Plank °1. Lie on your belly. °2. Prop up your body onto your forearms and your feet, keeping your legs straight. Your body should make a straight line between your shoulders and feet. °3. Hold this position for 10 seconds while keeping your abdominal muscles tight. °4. Lower your body to the starting position. °5. Repeat and try to hold this position longer, working your way up to 30 seconds. °Cross-core strengthening °1. Stand with your feet shoulder-width apart. °2. Hold a ball out in front of you. Keep your arms straight. °3. Tighten your abdominal muscles and slowly rotate at your waist from side to side. Keep your feet flat. °4. Once you are comfortable, try repeating this exercise with a heavier ball. °Top core strengthening °1. Stand about 18 inches (46 cm) in front of a wall, with your back to the wall. °2. Keep your feet flat and shoulder-width apart. °3. Tighten your abdominal muscles. °4. Bend your hips and knees. °5. Slowly reach between your legs to touch the wall behind you. °6. Slowly stand back up. °7. Raise your arms over your head and reach behind you. °8. Return to the starting position. °General tips °· Do not do any exercises that cause pain. If you have pain while exercising, talk to your health care provider. °· Always stretch before and after doing these exercises. This can help prevent injury. °· Maintain a healthy weight. Ask your health care provider what weight is healthy for you. °Contact a health care provider if: °· You have back pain that gets worse or does not go away. °· You feel pain while doing core strength exercises. °Get help right away if: °· You have severe pain that does not get better with medicine. °Summary °· Core exercises help to build strength in the muscles between your ribs and your waist. °· Core muscles help to support your body and keep your spine stable. °· Some activities, such as yoga and Pilates, can help to strengthen core muscles. °· Core  strength exercises can help back pain and can prevent injury. °· If you feel any pain while doing core strength exercises, stop. °This information is not intended to replace advice given to you by your health care provider. Make sure you discuss any questions you have with your health care provider. °Document Released: 01/28/2017 Document Revised: 12/29/2018 Document Reviewed: 01/28/2017 °Elsevier Patient Education © 2020 [employer] °Back Exercises °These exercises help to make your trunk and back strong. They also help to keep the lower back flexible. Doing these exercises can help to prevent back pain or lessen existing pain. °· If you have back pain, try to do these exercises 2-3 times each day or as told by your doctor. °· As you get better, do the exercises once each day. Repeat the exercises more often as told by your doctor. °· To stop back pain from coming back, do the exercises once each day, or as   told by your doctor. °Exercises °Single knee to chest °Do these steps 3-5 times in a row for each leg: °6. Lie on your back on a firm bed or the floor with your legs stretched out. °7. Bring one knee to your chest. °8. Grab your knee or thigh with both hands and hold them it in place. °9. Pull on your knee until you feel a gentle stretch in your lower back or buttocks. °10. Keep doing the stretch for 10-30 seconds. °11. Slowly let go of your leg and straighten it. °Pelvic tilt °Do these steps 5-10 times in a row: °8. Lie on your back on a firm bed or the floor with your legs stretched out. °9. Bend your knees so they point up to the ceiling. Your feet should be flat on the floor. °10. Tighten your lower belly (abdomen) muscles to press your lower back against the floor. This will make your tailbone point up to the ceiling instead of pointing down to your feet or the floor. °11. Stay in this position for 5-10 seconds while you gently tighten your muscles and breathe evenly. °Cat-cow °Do these steps until your  lower back bends more easily: °6. Get on your hands and knees on a firm surface. Keep your hands under your shoulders, and keep your knees under your hips. You may put padding under your knees. °7. Let your head hang down toward your chest. Tighten (contract) the muscles in your belly. Point your tailbone toward the floor so your lower back becomes rounded like the back of a cat. °8. Stay in this position for 5 seconds. °9. Slowly lift your head. Let the muscles of your belly relax. Point your tailbone up toward the ceiling so your back forms a sagging arch like the back of a cow. °10. Stay in this position for 5 seconds. ° °Press-ups °Do these steps 5-10 times in a row: °6. Lie on your belly (face-down) on the floor. °7. Place your hands near your head, about shoulder-width apart. °8. While you keep your back relaxed and keep your hips on the floor, slowly straighten your arms to raise the top half of your body and lift your shoulders. Do not use your back muscles. You may change where you place your hands in order to make yourself more comfortable. °9. Stay in this position for 5 seconds. °10. Slowly return to lying flat on the floor. ° °Bridges °Do these steps 10 times in a row: °8. Lie on your back on a firm surface. °9. Bend your knees so they point up to the ceiling. Your feet should be flat on the floor. Your arms should be flat at your sides, next to your body. °10. Tighten your butt muscles and lift your butt off the floor until your waist is almost as high as your knees. If you do not feel the muscles working in your butt and the back of your thighs, slide your feet 1-2 inches farther away from your butt. °11. Stay in this position for 3-5 seconds. °12. Slowly lower your butt to the floor, and let your butt muscles relax. °If this exercise is too easy, try doing it with your arms crossed over your chest. °Belly crunches °Do these steps 5-10 times in a row: °6. Lie on your back on a firm bed or the floor  with your legs stretched out. °7. Bend your knees so they point up to the ceiling. Your feet should be flat on the floor. °8. Cross   your arms over your chest. °9. Tip your chin a little bit toward your chest but do not bend your neck. °10. Tighten your belly muscles and slowly raise your chest just enough to lift your shoulder blades a tiny bit off of the floor. Avoid raising your body higher than that, because it can put too much stress on your low back. °11. Slowly lower your chest and your head to the floor. °Back lifts °Do these steps 5-10 times in a row: °5. Lie on your belly (face-down) with your arms at your sides, and rest your forehead on the floor. °6. Tighten the muscles in your legs and your butt. °7. Slowly lift your chest off of the floor while you keep your hips on the floor. Keep the back of your head in line with the curve in your back. Look at the floor while you do this. °8. Stay in this position for 3-5 seconds. °9. Slowly lower your chest and your face to the floor. °Contact a doctor if: °· Your back pain gets a lot worse when you do an exercise. °· Your back pain does not get better 2 hours after you exercise. °If you have any of these problems, stop doing the exercises. Do not do them again unless your doctor says it is okay. °Get help right away if: °· You have sudden, very bad back pain. If this happens, stop doing the exercises. Do not do them again unless your doctor says it is okay. °This information is not intended to replace advice given to you by your health care provider. Make sure you discuss any questions you have with your health care provider. °Document Released: 10/11/2010 Document Revised: 06/03/2018 Document Reviewed: 06/03/2018 °Elsevier Patient Education © 2020 [employer] ° °

## 2019-03-29 DIAGNOSIS — Z961 Presence of intraocular lens: Secondary | ICD-10-CM | POA: Diagnosis not present

## 2019-03-29 DIAGNOSIS — H43813 Vitreous degeneration, bilateral: Secondary | ICD-10-CM | POA: Diagnosis not present

## 2019-03-29 DIAGNOSIS — H26493 Other secondary cataract, bilateral: Secondary | ICD-10-CM | POA: Diagnosis not present

## 2019-04-18 ENCOUNTER — Other Ambulatory Visit: Payer: Self-pay | Admitting: Internal Medicine

## 2019-04-25 DIAGNOSIS — Z1231 Encounter for screening mammogram for malignant neoplasm of breast: Secondary | ICD-10-CM | POA: Diagnosis not present

## 2019-05-16 ENCOUNTER — Inpatient Hospital Stay: Payer: Medicare Other | Attending: Hematology & Oncology

## 2019-05-16 ENCOUNTER — Other Ambulatory Visit: Payer: Self-pay

## 2019-05-16 DIAGNOSIS — Z79899 Other long term (current) drug therapy: Secondary | ICD-10-CM | POA: Insufficient documentation

## 2019-05-16 DIAGNOSIS — Z7982 Long term (current) use of aspirin: Secondary | ICD-10-CM | POA: Diagnosis not present

## 2019-05-16 LAB — IRON AND TIBC
Iron: 131 ug/dL (ref 41–142)
Saturation Ratios: 60 % — ABNORMAL HIGH (ref 21–57)
TIBC: 218 ug/dL — ABNORMAL LOW (ref 236–444)
UIBC: 87 ug/dL — ABNORMAL LOW (ref 120–384)

## 2019-05-16 LAB — CBC WITH DIFFERENTIAL (CANCER CENTER ONLY)
Abs Immature Granulocytes: 0.02 10*3/uL (ref 0.00–0.07)
Basophils Absolute: 0.1 10*3/uL (ref 0.0–0.1)
Basophils Relative: 1 %
Eosinophils Absolute: 0.1 10*3/uL (ref 0.0–0.5)
Eosinophils Relative: 2 %
HCT: 34 % — ABNORMAL LOW (ref 36.0–46.0)
Hemoglobin: 11.8 g/dL — ABNORMAL LOW (ref 12.0–15.0)
Immature Granulocytes: 1 %
Lymphocytes Relative: 33 %
Lymphs Abs: 1.2 10*3/uL (ref 0.7–4.0)
MCH: 35.8 pg — ABNORMAL HIGH (ref 26.0–34.0)
MCHC: 34.7 g/dL (ref 30.0–36.0)
MCV: 103 fL — ABNORMAL HIGH (ref 80.0–100.0)
Monocytes Absolute: 0.4 10*3/uL (ref 0.1–1.0)
Monocytes Relative: 9 %
Neutro Abs: 2.1 10*3/uL (ref 1.7–7.7)
Neutrophils Relative %: 54 %
Platelet Count: 230 10*3/uL (ref 150–400)
RBC: 3.3 MIL/uL — ABNORMAL LOW (ref 3.87–5.11)
RDW: 11.5 % (ref 11.5–15.5)
WBC Count: 3.8 10*3/uL — ABNORMAL LOW (ref 4.0–10.5)
nRBC: 0 % (ref 0.0–0.2)

## 2019-05-16 LAB — CMP (CANCER CENTER ONLY)
ALT: 13 U/L (ref 0–44)
AST: 17 U/L (ref 15–41)
Albumin: 3.9 g/dL (ref 3.5–5.0)
Alkaline Phosphatase: 54 U/L (ref 38–126)
Anion gap: 7 (ref 5–15)
BUN: 12 mg/dL (ref 8–23)
CO2: 30 mmol/L (ref 22–32)
Calcium: 8.7 mg/dL — ABNORMAL LOW (ref 8.9–10.3)
Chloride: 103 mmol/L (ref 98–111)
Creatinine: 0.75 mg/dL (ref 0.44–1.00)
GFR, Est AFR Am: >60 mL/min (ref >60)
GFR, Estimated: >60 mL/min (ref >60)
Glucose, Bld: 88 mg/dL (ref 70–99)
Potassium: 4.1 mmol/L (ref 3.5–5.1)
Sodium: 140 mmol/L (ref 135–145)
Total Bilirubin: 0.5 mg/dL (ref 0.3–1.2)
Total Protein: 6.6 g/dL (ref 6.5–8.1)

## 2019-05-16 LAB — FERRITIN: Ferritin: 15 ng/mL (ref 11–307)

## 2019-05-19 ENCOUNTER — Other Ambulatory Visit: Payer: Self-pay

## 2019-05-19 ENCOUNTER — Inpatient Hospital Stay: Payer: Medicare Other

## 2019-05-19 ENCOUNTER — Encounter: Payer: Self-pay | Admitting: Hematology & Oncology

## 2019-05-19 ENCOUNTER — Inpatient Hospital Stay: Payer: Medicare Other | Admitting: Hematology & Oncology

## 2019-05-19 DIAGNOSIS — Z79899 Other long term (current) drug therapy: Secondary | ICD-10-CM | POA: Diagnosis not present

## 2019-05-19 DIAGNOSIS — Z7982 Long term (current) use of aspirin: Secondary | ICD-10-CM | POA: Diagnosis not present

## 2019-05-19 MED ORDER — SODIUM CHLORIDE 0.9 % IV SOLN
Freq: Once | INTRAVENOUS | Status: AC
  Administered 2019-05-19: 09:00:00 via INTRAVENOUS
  Filled 2019-05-19: qty 250

## 2019-05-19 NOTE — Patient Instructions (Signed)
Therapeutic Phlebotomy Therapeutic phlebotomy is the planned removal of blood from a person's body for the purpose of treating a medical condition. The procedure is similar to donating blood. Usually, about a pint (470 mL, or 0.47 L) of blood is removed. The average adult has 9-12 pints (4.3-5.7 L) of blood in the body. Therapeutic phlebotomy may be used to treat the following medical conditions:  Hemochromatosis. This is a condition in which the blood contains too much iron.  Polycythemia vera. This is a condition in which the blood contains too many red blood cells.  Porphyria cutanea tarda. This is a disease in which an important part of hemoglobin is not made properly. It results in the buildup of abnormal amounts of porphyrins in the body.  Sickle cell disease. This is a condition in which the red blood cells form an abnormal crescent shape rather than a round shape. Tell a health care provider about:  Any allergies you have.  All medicines you are taking, including vitamins, herbs, eye drops, creams, and over-the-counter medicines.  Any problems you or family members have had with anesthetic medicines.  Any blood disorders you have.  Any surgeries you have had.  Any medical conditions you have.  Whether you are pregnant or may be pregnant. What are the risks? Generally, this is a safe procedure. However, problems may occur, including:  Nausea or light-headedness.  Low blood pressure (hypotension).  Soreness, bleeding, swelling, or bruising at the needle insertion site.  Infection. What happens before the procedure?  Follow instructions from your health care provider about eating or drinking restrictions.  Ask your health care provider about: ? Changing or stopping your regular medicines. This is especially important if you are taking diabetes medicines or blood thinners (anticoagulants). ? Taking medicines such as aspirin and ibuprofen. These medicines can thin your  blood. Do not take these medicines unless your health care provider tells you to take them. ? Taking over-the-counter medicines, vitamins, herbs, and supplements.  Wear clothing with sleeves that can be raised above the elbow.  Plan to have someone take you home from the hospital or clinic.  You may have a blood sample taken.  Your blood pressure, pulse rate, and breathing rate will be measured. What happens during the procedure?   To lower your risk of infection: ? Your health care team will wash or sanitize their hands. ? Your skin will be cleaned with an antiseptic.  You may be given a medicine to numb the area (local anesthetic).  A tourniquet will be placed on your arm.  A needle will be inserted into one of your veins.  Tubing and a collection bag will be attached to that needle.  Blood will flow through the needle and tubing into the collection bag.  The collection bag will be placed lower than your arm to allow gravity to help the flow of blood into the bag.  You may be asked to open and close your hand slowly and continually during the entire collection.  After the specified amount of blood has been removed from your body, the collection bag and tubing will be clamped.  The needle will be removed from your vein.  Pressure will be held on the site of the needle insertion to stop the bleeding.  A bandage (dressing) will be placed over the needle insertion site. The procedure may vary among health care providers and hospitals. What happens after the procedure?  Your blood pressure, pulse rate, and breathing rate will be   measured after the procedure.  You will be encouraged to drink fluids.  Your recovery will be assessed and monitored.  You can return to your normal activities as told by your health care provider. Summary  Therapeutic phlebotomy is the planned removal of blood from a person's body for the purpose of treating a medical condition.  Therapeutic  phlebotomy may be used to treat hemochromatosis, polycythemia vera, porphyria cutanea tarda, or sickle cell disease.  In the procedure, a needle is inserted and about a pint (470 mL, or 0.47 L) of blood is removed. The average adult has 9-12 pints (4.3-5.7 L) of blood in the body.  This is generally a safe procedure, but it can sometimes cause problems such as nausea, light-headedness, or low blood pressure (hypotension). This information is not intended to replace advice given to you by your health care provider. Make sure you discuss any questions you have with your health care provider. Document Released: 02/10/2011 Document Revised: 09/24/2017 Document Reviewed: 09/24/2017 Elsevier Patient Education  2020 Elsevier Inc.  

## 2019-05-19 NOTE — Progress Notes (Signed)
Angela Burgess presents today for phlebotomy per MD orders. Phlebotomy procedure started at 0833 and ended at  [place] with 510 grams removed via 18 to RAC  Patient observed for 30 minutes after procedure without any incident. Patient tolerated procedure well. Diet and nutrition offered .Fluids were given over one hour as patient said she "always gets them in one hour" - MD Ennever agreed

## 2019-05-19 NOTE — Progress Notes (Signed)
Hematology and Oncology Follow Up Visit  RAEONNA GOODLOW [id number] 1941/08/31 78 y.o. 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 Rickel is here today for follow-up.  Unfortunately, I had no idea that her husband passed away in 2023-08-24.  I think he had cancer.  I think he was treated at [facility].  She is doing okay.  She is trying to avoid the virus.  She is staying at home.  She has had no issues with nausea or vomiting.  She has had no issues with joint pains.  She had blood work done a couple days ago.  Her iron saturation was 60%.  Her ferritin was 15.  I think we will go ahead and phlebotomize her 1 unit.  I think she can manage this.  She has had a good appetite.  She has had no chest pain.  She has had no cardiac issues.  There is been no change in bowel or bladder habits.  Overall, her performance status is ECOG 0.   Medications:  Allergies as of 05/19/2019      Reactions   Tetracycline Rash      Medication List       Accurate as of May 19, 2019  8:26 AM. If you have any questions, ask your nurse or doctor.        aspirin 81 MG tablet Take 81 mg by mouth daily.   Astaxanthin 4 MG Caps Take 1 capsule by mouth 2 (two) times daily.   Calcium 500/D 500-125 MG-UNIT Tabs Generic drug: Calcium Carbonate-Vitamin D Take by mouth.   cetirizine 10 MG tablet Commonly known as: ZYRTEC Take by mouth.   diclofenac sodium 1 % Gel Commonly known as: VOLTAREN 3 grams to 3 large joints up to three times a day   estradiol 0.1 MG/GM vaginal cream Commonly known as: ESTRACE Place 1 Applicatorful vaginally 2 (two) times a week.   levothyroxine 112 MCG tablet Commonly known as: SYNTHROID TAKE 1 TABLET BY MOUTH EVERY DAY   metroNIDAZOLE 0.75 % cream Commonly known as: METROCREAM Apply 1 application topically daily.    multivitamin tablet Take 1 tablet by mouth daily.   Vitamin D3 50 MCG (2000 UT) Tabs Take by mouth every morning.       Allergies:  Allergies  Allergen Reactions  . Tetracycline Rash    Past Medical History, Surgical history, Social history, and Family History were reviewed and updated.  Review of Systems: Review of Systems  Constitutional: Negative.   HENT: Negative.   Eyes: Negative.   Respiratory: Negative.   Cardiovascular: Negative.   Gastrointestinal: Negative.   Genitourinary: Negative.   Musculoskeletal: Negative.   Skin: Negative.   Neurological: Negative.   Endo/Heme/Allergies: Negative.   Psychiatric/Behavioral: Negative.      Physical Exam:  weight is 117 lb (53.1 kg). Her temporal temperature is 97.8 F (36.6 C). Her blood pressure is 168/60 (abnormal) and her pulse is 71. Her respiration is 16 and oxygen saturation is 100%.   Wt Readings from Last 3 Encounters:  05/19/19 117 lb (53.1 kg)  03/28/19 116 lb 3.2 oz (52.7 kg)  02/18/19 114 lb 1.9 oz (51.8 kg)    Physical Exam Vitals signs reviewed.  HENT:     Head: Normocephalic and atraumatic.  Eyes:     Pupils: Pupils are equal, round, and reactive to  light.  Neck:     Musculoskeletal: Normal range of motion.  Cardiovascular:     Rate and Rhythm: Normal rate and regular rhythm.     Heart sounds: Normal heart sounds.  Pulmonary:     Effort: Pulmonary effort is normal.     Breath sounds: Normal breath sounds.  Abdominal:     General: Bowel sounds are normal.     Palpations: Abdomen is soft.  Musculoskeletal: Normal range of motion.        General: No tenderness or deformity.  Lymphadenopathy:     Cervical: No cervical adenopathy.  Skin:    General: Skin is warm and dry.     Findings: No erythema or rash.  Neurological:     Mental Status: She is alert and oriented to person, place, and time.  Psychiatric:        Behavior: Behavior normal.        Thought Content: Thought content normal.         Judgment: Judgment normal.      Lab Results  Component Value Date   WBC 3.8 (L) 05/16/2019   HGB 11.8 (L) 05/16/2019   HCT 34.0 (L) 05/16/2019   MCV 103.0 (H) 05/16/2019   PLT 230 05/16/2019   Lab Results  Component Value Date   FERRITIN 15 05/16/2019   IRON 131 05/16/2019   TIBC 218 (L) 05/16/2019   UIBC 87 (L) 05/16/2019   IRONPCTSAT 60 (H) 05/16/2019   Lab Results  Component Value Date   RETICCTPCT 1.4 06/13/2015   RBC 3.30 (L) 05/16/2019   RETICCTABS 47.6 06/13/2015   No results found for: KPAFRELGTCHN, LAMBDASER, KAPLAMBRATIO No results found for: IGGSERUM, IGA, IGMSERUM No results found for: Odetta Pink, SPEI   Chemistry      Component Value Date/Time   NA 140 05/16/2019 0803   NA 145 08/24/2017 1052   NA 138 01/27/2017 1122   K 4.1 05/16/2019 0803   K 3.9 08/24/2017 1052   K 4.6 01/27/2017 1122   CL 103 05/16/2019 0803   CL 102 08/24/2017 1052   CO2 30 05/16/2019 0803   CO2 31 08/24/2017 1052   CO2 29 01/27/2017 1122   BUN 12 05/16/2019 0803   BUN 16 08/24/2017 1052   BUN 14.6 01/27/2017 1122   CREATININE 0.75 05/16/2019 0803   CREATININE 1.1 08/24/2017 1052   CREATININE 0.8 01/27/2017 1122      Component Value Date/Time   CALCIUM 8.7 (L) 05/16/2019 0803   CALCIUM 9.5 08/24/2017 1052   CALCIUM 9.7 01/27/2017 1122   ALKPHOS 54 05/16/2019 0803   ALKPHOS 65 08/24/2017 1052   ALKPHOS 58 01/27/2017 1122   AST 17 05/16/2019 0803   AST 18 01/27/2017 1122   ALT 13 05/16/2019 0803   ALT 18 08/24/2017 1052   ALT 13 01/27/2017 1122   BILITOT 0.5 05/16/2019 0803   BILITOT 0.42 01/27/2017 1122       Impression and Plan: Ms. Monsen is a very pleasant 78 yo caucasian female with hemochromatosis, homozygous for C282Y mutation.   We will go ahead and phlebotomize her today.  She comes in every 3 months.  We will have her lab work done a couple days before we see her.  This was works best for  her.  I just feel bad that her husband passed away.  He was a real nice man who was fun to talk to. [name]   [name], MD 8/27/20208:26  AM

## 2019-06-15 NOTE — Progress Notes (Signed)
Chief Complaint  Patient presents with  . Follow-up    Bp pt states she had a dizzy spell 6 months ago and systolic is higher at doctors visits    HPI: Angela Burgess 78 y.o. come in for Concern about elevated bp at her past  Med cvisits  She thinksbp had been up a lot    From stress also   No hx of same   Bipolar son. Who has taken to etoh since her husbands death   She has a  [facility] monitor  Minor ht.  And says mild elevation but doesn't remember number  thnks  Stess. No exercise intolerance  Cp sob  Gets ocass palpitation when lays down to be at night without assoc sx .   Has a sister  hbp  Father heart condition.   She is under care of hemochormotosis and thyroid disease .  ROS: See pertinent positives and negatives per HPI. b no syncope bleeding  Fever   resp sx   Past Medical History:  Diagnosis Date  . Anemia   . Arthritis   . Cervical cancer ([place]) 1968  . Dexamethasone adverse reaction 2008  . Elevated MCV    for years  . Eye exam abnormal 6/09  . Family history of malignant neoplasm of gastrointestinal tract   . Gastritis   . History of mammogram 1/10  . History of normal resting EKG 2005  . Hyperlipidemia   . Hypothyroidism   . IBS (irritable bowel syndrome)   . Macrocytosis    with theme eval in past and normal b12  . Osteopenia   . Rectocele   . Rosacea   . Tetanus-diphtheria (Td) vaccination 1999    Family History  Problem Relation Age of Onset  . Cirrhosis Mother   . Kidney disease Mother   . Allergy (severe) Mother        protein allergy that sent posion to her brain  . Leukemia Father   . Heart attack Father 30  . Diabetes Father   . Bipolar disorder Sister   . Stroke Sister   . Lung cancer Brother        smoker  . Seizures Brother   . Bipolar disorder Son   . Colon cancer Brother   . Seizures Sister   . Seizures Sister   . Breast cancer Maternal Aunt   . Breast cancer Cousin   . Lung cancer Cousin     Social History   Socioeconomic  History  . Marital status: Married    Spouse name: Not on file  . Number of children: 2  . Years of education: Not on file  . Highest education level: Not on file  Occupational History  . Occupation: retired  Scientific laboratory technician  . Financial resource strain: Not on file  . Food insecurity    Worry: Not on file    Inability: Not on file  . Transportation needs    Medical: Not on file    Non-medical: Not on file  Tobacco Use  . Smoking status: Never Smoker  . Smokeless tobacco: Never Used  . Tobacco comment: never used tobacco  Substance and Sexual Activity  . Alcohol use: Not Currently    Alcohol/week: 0.0 standard drinks    Comment: none now   . Drug use: Never  . Sexual activity: Not on file  Lifestyle  . Physical activity    Days per week: Not on file    Minutes per session: Not  on file  . Stress: Not on file  Relationships  . Social Herbalist on phone: Not on file    Gets together: Not on file    Attends religious service: Not on file    Active member of club or organization: Not on file    Attends meetings of clubs or organizations: Not on file    Relationship status: Not on file  Other Topics Concern  . 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 aspirin 81 MG tablet Take 81 mg by mouth daily.    . Astaxanthin 4 MG CAPS Take 1 capsule by mouth 2 (two) times daily.    . Calcium Carbonate-Vitamin D (CALCIUM 500/D) 500-125 MG-UNIT TABS Take by mouth.      . cetirizine (ZYRTEC) 10 MG tablet Take by mouth.    . Cholecalciferol (VITAMIN D3) 2000 UNITS TABS Take by mouth every morning.    . diclofenac sodium (VOLTAREN) 1 % GEL 3 grams to 3 large joints up to three times a day 3 Tube 3  . estradiol (ESTRACE) 0.1 MG/GM vaginal cream Place 1 Applicatorful vaginally 2 (two) times a week.     . levothyroxine  (SYNTHROID) 112 MCG tablet TAKE 1 TABLET BY MOUTH EVERY DAY 90 tablet 1  . metroNIDAZOLE (METROCREAM) 0.75 % cream Apply 1 application topically daily.      . Multiple Vitamin (MULTIVITAMIN) tablet Take 1 tablet by mouth daily.       No facility-administered medications prior to visit.      EXAM:  BP (!) 150/68 (BP Location: Right Arm, Patient Position: Sitting)   Pulse 80   Temp 98.2 F (36.8 C) (Temporal)   Wt 116 lb (52.6 kg)   SpO2 99%   BMI 19.91 kg/m   Body mass index is 19.91 kg/m.  GENERAL: vitals reviewed and listed above, alert, oriented, appears well hydrated and in no acute distress HEENT: atraumatic, conjunctiva  clear, no obvious abnormalities on inspection of external nose and ears OP : masked   NECK: no obvious masses on inspection palpation  LUNGS: clear to auscultation bilaterally, no wheezes, rales or rhonchi, good air movement CV: HRRR,  No g or m no clubbing cyanosis or  peripheral edema nl cap refill  MS: moves all extremities without noticeable focal  abnormality PSYCH: pleasant and cooperative, no obvious depression or anxiety Lab Results  Component Value Date   WBC 3.8 (L) 05/16/2019   HGB 11.8 (L) 05/16/2019   HCT 34.0 (L) 05/16/2019   PLT 230 05/16/2019   GLUCOSE 88 05/16/2019   CHOL 223 (H) 10/29/2018   TRIG 61.0 10/29/2018   HDL 63.10 10/29/2018   LDLDIRECT 165.2 07/21/2012   LDLCALC 147 (H) 10/29/2018   ALT 13 05/16/2019   AST 17 05/16/2019   NA 140 05/16/2019   K 4.1 05/16/2019   CL 103 05/16/2019   CREATININE 0.75 05/16/2019   BUN 12 05/16/2019   CO2 30 05/16/2019   TSH 1.17 10/29/2018   HGBA1C 5.1 09/18/2015   BP Readings from Last 3 Encounters:  06/17/19 (!) 150/68  05/19/19 (!) 158/60  05/19/19 (!) 168/60   Wt Readings from Last 3 Encounters:  06/17/19 116 lb (52.6 kg)  05/19/19 117 lb (53.1 kg)  03/28/19 116 lb  3.2 oz (52.7 kg)      ASSESSMENT AND PLAN:  Discussed the following assessment and plan:  Elevated  blood pressure reading  Medication management  Stress 150/68  Suspect underlying  mildl ht with WC response based on her hx but  Need more data and may  bnefit from low dosie intervnetion.   Want to avoid hypotension  . Disc getting more data and send in readongs and plan fu in person or virtual ( hasnt done this before or telephone   For fu ) she can bring in her monitor to her next medical appt to get correlation.  Total visit 26mins > 50% spent counseling and coordinating care as indicated in above note and in instructions to patient .  About bp monitoring  And  Plan of med if indicated   And to send in my chart Return for blood pressure send in reading in a few weeks and then decide on follow up . .  -Patient advised to return or notify health care team  if  new concerns arise.  Patient Instructions    Take blood pressure readings twice a day for 7- 10 days and then periodically .goal is below1 40/90 sometimes 145   .  Send in readings   As we discussed  Take  Your BP monitor to your next  Medical visit to compare your monitor to the office readings to make sure they are in sync.    We will go from there  May add low dose medication  If  Indicated .    How to Take Your Blood Pressure You can take your blood pressure at home with a machine. You may need to check your blood pressure at home:  To check if you have high blood pressure (hypertension).  To check your blood pressure over time.  To make sure your blood pressure medicine is working. Supplies needed: You will need a blood pressure machine, or monitor. You can buy one at a drugstore or online. When choosing one:  Choose one with an arm cuff.  Choose one that wraps around your upper arm. Only one finger should fit between your arm and the cuff.  Do not choose one that measures your blood pressure from your wrist or finger. Your doctor can suggest a monitor. How to prepare Avoid these things for 30 minutes before  checking your blood pressure:  Drinking caffeine.  Drinking alcohol.  Eating.  Smoking.  Exercising. Five minutes before checking your blood pressure:  Pee.  Sit in a dining chair. Avoid sitting in a soft couch or armchair.  Be quiet. Do not talk. How to take your blood pressure Follow the instructions that came with your machine. If you have a digital blood pressure monitor, these may be the instructions: 1. Sit up straight. 2. Place your feet on the floor. Do not cross your ankles or legs. 3. Rest your left arm at the level of your heart. You may rest it on a table, desk, or chair. 4. Pull up your shirt sleeve. 5. Wrap the blood pressure cuff around the upper part of your left arm. The cuff should be 1 inch (2.5 cm) above your elbow. It is best to wrap the cuff around bare skin. 6. Fit the cuff snugly around your arm. You should be able to place only one finger between the cuff and your arm. 7. Put the cord inside the groove of your elbow. 8. Press the power button. 9. Sit quietly  while the cuff fills with air and loses air. 10. Write down the numbers on the screen. 11. Wait 2-3 minutes and then repeat steps 1-10. What do the numbers mean? Two numbers make up your blood pressure. The first number is called systolic pressure. The second is called diastolic pressure. An example of a blood pressure reading is "120 over 80" (or 120/80). If you are an adult and do not have a medical condition, use this guide to find out if your blood pressure is normal: Normal  First number: below 120.  Second number: below 80. Elevated  First number: 120-129.  Second number: below 80. Hypertension stage 1  First number: 130-139.  Second number: 80-89. Hypertension stage 2  First number: 140 or above.  Second number: 48 or above. Your blood pressure is above normal even if only the top or bottom number is above normal. Follow these instructions at home:  Check your blood pressure  as often as your doctor tells you to.  Take your monitor to your next doctor's appointment. Your doctor will: ? Make sure you are using it correctly. ? Make sure it is working right.  Make sure you understand what your blood pressure numbers should be.  Tell your doctor if your medicines are causing side effects. Contact a doctor if:  Your blood pressure keeps being high. Get help right away if:  Your first blood pressure number is higher than 180.  Your second blood pressure number is higher than 120. This information is not intended to replace advice given to you by your health care provider. Make sure you discuss any questions you have with your health care provider. Document Released: 08/21/2008 Document Revised: 08/21/2017 Document Reviewed: 02/15/2016 Elsevier Patient Education  2020 Taylor Yadiel Aubry M.D.

## 2019-06-17 ENCOUNTER — Encounter: Payer: Self-pay | Admitting: Internal Medicine

## 2019-06-17 ENCOUNTER — Other Ambulatory Visit: Payer: Self-pay

## 2019-06-17 ENCOUNTER — Ambulatory Visit (INDEPENDENT_AMBULATORY_CARE_PROVIDER_SITE_OTHER): Payer: Medicare Other | Admitting: Internal Medicine

## 2019-06-17 VITALS — BP 150/68 | HR 80 | Temp 98.2°F | Wt 116.0 lb

## 2019-06-17 DIAGNOSIS — Z79899 Other long term (current) drug therapy: Secondary | ICD-10-CM

## 2019-06-17 DIAGNOSIS — R030 Elevated blood-pressure reading, without diagnosis of hypertension: Secondary | ICD-10-CM

## 2019-06-17 DIAGNOSIS — F439 Reaction to severe stress, unspecified: Secondary | ICD-10-CM | POA: Diagnosis not present

## 2019-06-17 NOTE — Patient Instructions (Signed)
Take blood pressure readings twice a day for 7- 10 days and then periodically .goal is below1 40/90 sometimes 145   .  Send in readings   As we discussed  Take  Your BP monitor to your next  Medical visit to compare your monitor to the office readings to make sure they are in sync.    We will go from there  May add low dose medication  If  Indicated .    How to Take Your Blood Pressure You can take your blood pressure at home with a machine. You may need to check your blood pressure at home:  To check if you have high blood pressure (hypertension).  To check your blood pressure over time.  To make sure your blood pressure medicine is working. Supplies needed: You will need a blood pressure machine, or monitor. You can buy one at a drugstore or online. When choosing one:  Choose one with an arm cuff.  Choose one that wraps around your upper arm. Only one finger should fit between your arm and the cuff.  Do not choose one that measures your blood pressure from your wrist or finger. Your doctor can suggest a monitor. How to prepare Avoid these things for 30 minutes before checking your blood pressure:  Drinking caffeine.  Drinking alcohol.  Eating.  Smoking.  Exercising. Five minutes before checking your blood pressure:  Pee.  Sit in a dining chair. Avoid sitting in a soft couch or armchair.  Be quiet. Do not talk. How to take your blood pressure Follow the instructions that came with your machine. If you have a digital blood pressure monitor, these may be the instructions: 1. Sit up straight. 2. Place your feet on the floor. Do not cross your ankles or legs. 3. Rest your left arm at the level of your heart. You may rest it on a table, desk, or chair. 4. Pull up your shirt sleeve. 5. Wrap the blood pressure cuff around the upper part of your left arm. The cuff should be 1 inch (2.5 cm) above your elbow. It is best to wrap the cuff around bare skin. 6. Fit the cuff  snugly around your arm. You should be able to place only one finger between the cuff and your arm. 7. Put the cord inside the groove of your elbow. 8. Press the power button. 9. Sit quietly while the cuff fills with air and loses air. 10. Write down the numbers on the screen. 11. Wait 2-3 minutes and then repeat steps 1-10. What do the numbers mean? Two numbers make up your blood pressure. The first number is called systolic pressure. The second is called diastolic pressure. An example of a blood pressure reading is "120 over 80" (or 120/80). If you are an adult and do not have a medical condition, use this guide to find out if your blood pressure is normal: Normal  First number: below 120.  Second number: below 80. Elevated  First number: 120-129.  Second number: below 80. Hypertension stage 1  First number: 130-139.  Second number: 80-89. Hypertension stage 2  First number: 140 or above.  Second number: 12 or above. Your blood pressure is above normal even if only the top or bottom number is above normal. Follow these instructions at home:  Check your blood pressure as often as your doctor tells you to.  Take your monitor to your next doctor's appointment. Your doctor will: ? Make sure you are using  it correctly. ? Make sure it is working right.  Make sure you understand what your blood pressure numbers should be.  Tell your doctor if your medicines are causing side effects. Contact a doctor if:  Your blood pressure keeps being high. Get help right away if:  Your first blood pressure number is higher than 180.  Your second blood pressure number is higher than 120. This information is not intended to replace advice given to you by your health care provider. Make sure you discuss any questions you have with your health care provider. Document Released: 08/21/2008 Document Revised: 08/21/2017 Document Reviewed: 02/15/2016 Elsevier Patient Education  2020 [employer].

## 2019-08-08 ENCOUNTER — Other Ambulatory Visit: Payer: Medicare Other

## 2019-08-10 ENCOUNTER — Ambulatory Visit: Payer: Medicare Other | Admitting: Hematology & Oncology

## 2019-08-26 ENCOUNTER — Inpatient Hospital Stay: Payer: Medicare Other | Attending: Hematology & Oncology

## 2019-08-26 ENCOUNTER — Telehealth: Payer: Self-pay | Admitting: *Deleted

## 2019-08-26 ENCOUNTER — Other Ambulatory Visit: Payer: Self-pay

## 2019-08-26 DIAGNOSIS — Z79899 Other long term (current) drug therapy: Secondary | ICD-10-CM | POA: Insufficient documentation

## 2019-08-26 LAB — CBC WITH DIFFERENTIAL (CANCER CENTER ONLY)
Abs Immature Granulocytes: 0 10*3/uL (ref 0.00–0.07)
Basophils Absolute: 0 10*3/uL (ref 0.0–0.1)
Basophils Relative: 1 %
Eosinophils Absolute: 0.1 10*3/uL (ref 0.0–0.5)
Eosinophils Relative: 2 %
HCT: 35.7 % — ABNORMAL LOW (ref 36.0–46.0)
Hemoglobin: 12.3 g/dL (ref 12.0–15.0)
Immature Granulocytes: 0 %
Lymphocytes Relative: 27 %
Lymphs Abs: 1.1 10*3/uL (ref 0.7–4.0)
MCH: 34.7 pg — ABNORMAL HIGH (ref 26.0–34.0)
MCHC: 34.5 g/dL (ref 30.0–36.0)
MCV: 100.8 fL — ABNORMAL HIGH (ref 80.0–100.0)
Monocytes Absolute: 0.3 10*3/uL (ref 0.1–1.0)
Monocytes Relative: 8 %
Neutro Abs: 2.5 10*3/uL (ref 1.7–7.7)
Neutrophils Relative %: 62 %
Platelet Count: 216 10*3/uL (ref 150–400)
RBC: 3.54 MIL/uL — ABNORMAL LOW (ref 3.87–5.11)
RDW: 11.3 % — ABNORMAL LOW (ref 11.5–15.5)
WBC Count: 4 10*3/uL (ref 4.0–10.5)
nRBC: 0 % (ref 0.0–0.2)

## 2019-08-26 LAB — IRON AND TIBC
Iron: 116 ug/dL (ref 28–170)
Saturation Ratios: 44 % — ABNORMAL HIGH (ref 10.4–31.8)
TIBC: 261 ug/dL (ref 250–450)
UIBC: 145 ug/dL

## 2019-08-26 LAB — CMP (CANCER CENTER ONLY)
ALT: 11 U/L (ref 0–44)
AST: 16 U/L (ref 15–41)
Albumin: 4.4 g/dL (ref 3.5–5.0)
Alkaline Phosphatase: 63 U/L (ref 38–126)
Anion gap: 8 (ref 5–15)
BUN: 16 mg/dL (ref 8–23)
CO2: 31 mmol/L (ref 22–32)
Calcium: 9.6 mg/dL (ref 8.9–10.3)
Chloride: 103 mmol/L (ref 98–111)
Creatinine: 0.86 mg/dL (ref 0.44–1.00)
GFR, Est AFR Am: >60 mL/min (ref >60)
GFR, Estimated: >60 mL/min (ref >60)
Glucose, Bld: 123 mg/dL — ABNORMAL HIGH (ref 70–99)
Potassium: 4.7 mmol/L (ref 3.5–5.1)
Sodium: 142 mmol/L (ref 135–145)
Total Bilirubin: 0.5 mg/dL (ref 0.3–1.2)
Total Protein: 7.1 g/dL (ref 6.5–8.1)

## 2019-08-26 LAB — FERRITIN: Ferritin: 12 ng/mL (ref 11–307)

## 2019-08-26 NOTE — Telephone Encounter (Signed)
-----   Message from [name], MD sent at 08/26/2019  3:16 PM EST ----- Call - iron level is ok!!  No need for a phlebotomy!!  Laurey Arrow

## 2019-08-26 NOTE — Telephone Encounter (Signed)
Patient notified per order of Dr. Marin Olp that the "iron level is ok!!  No need for a phlebotomy!!"  Pt appreciative of call and has no questions or concerns at this time.

## 2019-08-30 ENCOUNTER — Inpatient Hospital Stay: Payer: Medicare Other

## 2019-08-30 ENCOUNTER — Inpatient Hospital Stay: Payer: Medicare Other | Admitting: Hematology & Oncology

## 2019-08-30 ENCOUNTER — Other Ambulatory Visit: Payer: Self-pay

## 2019-08-30 ENCOUNTER — Encounter: Payer: Self-pay | Admitting: Hematology & Oncology

## 2019-08-30 DIAGNOSIS — Z79899 Other long term (current) drug therapy: Secondary | ICD-10-CM | POA: Diagnosis not present

## 2019-08-30 NOTE — Progress Notes (Signed)
Hematology and Oncology Follow Up Visit  Angela Burgess [id number] September 07, 1941 78 y.o. 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  She is doing okay.  It has now been a year since Angela husband passed away.  There is been no problems with respect to the hemochromatosis.  We check Angela every 3 months.  We last addressed as about a week ago.  Angela ferritin was 12 with iron saturation of 44%.  In the last we had to phlebotomize Angela was probably in August.  She had a nice quiet Thanksgiving.  Angela Burgess who lives up in [place], [place], is having some issues.  She is going up there to try to help out.  Overall, Angela performance status is ECOG 0.   Medications:  Allergies as of 08/30/2019      Reactions   Tetracycline Rash      Medication List       Accurate as of August 30, 2019 11:12 AM. If you have any questions, ask your nurse or doctor.        aspirin 81 MG tablet Take 81 mg by mouth daily.   Astaxanthin 4 MG Caps Take 1 capsule by mouth 2 (two) times daily.   Calcium 500/D 500-125 MG-UNIT Tabs Generic drug: Calcium Carbonate-Vitamin D Take by mouth.   cetirizine 10 MG tablet Commonly known as: ZYRTEC Take by mouth.   diclofenac sodium 1 % Gel Commonly known as: VOLTAREN 3 grams to 3 large joints up to three times a day   estradiol 0.1 MG/GM vaginal cream Commonly known as: ESTRACE Place 1 Applicatorful vaginally 2 (two) times a week.   levothyroxine 112 MCG tablet Commonly known as: SYNTHROID TAKE 1 TABLET BY MOUTH EVERY DAY   metroNIDAZOLE 0.75 % cream Commonly known as: METROCREAM Apply 1 application topically daily.   multivitamin tablet Take 1 tablet by mouth daily.   Vitamin D3 50 MCG (2000 UT) Tabs Take by mouth every morning.       Allergies:  Allergies   Allergen Reactions  . Tetracycline Rash    Past Medical History, Surgical history, Social history, and Family History were reviewed and updated.  Review of Systems: Review of Systems  Constitutional: Negative.   HENT: Negative.   Eyes: Negative.   Respiratory: Negative.   Cardiovascular: Negative.   Gastrointestinal: Negative.   Genitourinary: Negative.   Musculoskeletal: Negative.   Skin: Negative.   Neurological: Negative.   Endo/Heme/Allergies: Negative.   Psychiatric/Behavioral: Negative.      Physical Exam:  weight is 114 lb (51.7 kg). Angela temporal temperature is 97 F (36.1 C) (abnormal). Angela blood pressure is 177/65 (abnormal) and Angela pulse is 68. Angela respiration is 16 and oxygen saturation is 100%.   Wt Readings from Last 3 Encounters:  08/30/19 114 lb (51.7 kg)  06/17/19 116 lb (52.6 kg)  05/19/19 117 lb (53.1 kg)    Physical Exam Vitals signs reviewed.  HENT:     Head: Normocephalic and atraumatic.  Eyes:     Pupils: Pupils are equal, round, and reactive to light.  Neck:     Musculoskeletal: Normal range of motion.  Cardiovascular:     Rate and Rhythm: Normal rate and regular rhythm.     Heart sounds: Normal heart sounds.  Pulmonary:     Effort: Pulmonary  effort is normal.     Breath sounds: Normal breath sounds.  Abdominal:     General: Bowel sounds are normal.     Palpations: Abdomen is soft.  Musculoskeletal: Normal range of motion.        General: No tenderness or deformity.  Lymphadenopathy:     Cervical: No cervical adenopathy.  Skin:    General: Skin is warm and dry.     Findings: No erythema or rash.  Neurological:     Mental Status: She is alert and oriented to person, place, and time.  Psychiatric:        Behavior: Behavior normal.        Thought Content: Thought content normal.        Judgment: Judgment normal.      Lab Results  Component Value Date   WBC 4.0 08/26/2019   HGB 12.3 08/26/2019   HCT 35.7 (L) 08/26/2019   MCV  100.8 (H) 08/26/2019   PLT 216 08/26/2019   Lab Results  Component Value Date   FERRITIN 12 08/26/2019   IRON 116 08/26/2019   TIBC 261 08/26/2019   UIBC 145 08/26/2019   IRONPCTSAT 44 (H) 08/26/2019   Lab Results  Component Value Date   RETICCTPCT 1.4 06/13/2015   RBC 3.54 (L) 08/26/2019   RETICCTABS 47.6 06/13/2015   No results found for: KPAFRELGTCHN, LAMBDASER, KAPLAMBRATIO No results found for: IGGSERUM, IGA, IGMSERUM No results found for: Odetta Pink, SPEI   Chemistry      Component Value Date/Time   NA 142 08/26/2019 0908   NA 145 08/24/2017 1052   NA 138 01/27/2017 1122   K 4.7 08/26/2019 0908   K 3.9 08/24/2017 1052   K 4.6 01/27/2017 1122   CL 103 08/26/2019 0908   CL 102 08/24/2017 1052   CO2 31 08/26/2019 0908   CO2 31 08/24/2017 1052   CO2 29 01/27/2017 1122   BUN 16 08/26/2019 0908   BUN 16 08/24/2017 1052   BUN 14.6 01/27/2017 1122   CREATININE 0.86 08/26/2019 0908   CREATININE 1.1 08/24/2017 1052   CREATININE 0.8 01/27/2017 1122      Component Value Date/Time   CALCIUM 9.6 08/26/2019 0908   CALCIUM 9.5 08/24/2017 1052   CALCIUM 9.7 01/27/2017 1122   ALKPHOS 63 08/26/2019 0908   ALKPHOS 65 08/24/2017 1052   ALKPHOS 58 01/27/2017 1122   AST 16 08/26/2019 0908   AST 18 01/27/2017 1122   ALT 11 08/26/2019 0908   ALT 18 08/24/2017 1052   ALT 13 01/27/2017 1122   BILITOT 0.5 08/26/2019 0908   BILITOT 0.42 01/27/2017 1122       Impression and Plan: Angela Burgess is a very pleasant 78yo caucasian female with hemochromatosis, homozygous for C282Y mutation.   She comes in every 3 months for lab work.  I will plan to see Angela back in 6 months.. .   [name], MD 12/8/202011:12 AM

## 2019-09-07 DIAGNOSIS — H608X3 Other otitis externa, bilateral: Secondary | ICD-10-CM | POA: Diagnosis not present

## 2019-09-07 DIAGNOSIS — H6123 Impacted cerumen, bilateral: Secondary | ICD-10-CM | POA: Diagnosis not present

## 2019-09-12 DIAGNOSIS — D485 Neoplasm of uncertain behavior of skin: Secondary | ICD-10-CM | POA: Diagnosis not present

## 2019-09-12 DIAGNOSIS — L570 Actinic keratosis: Secondary | ICD-10-CM | POA: Diagnosis not present

## 2019-09-12 DIAGNOSIS — C44311 Basal cell carcinoma of skin of nose: Secondary | ICD-10-CM | POA: Diagnosis not present

## 2019-09-12 MED ORDER — AMLODIPINE BESYLATE 2.5 MG PO TABS: 2.5000 mg | ORAL_TABLET | Freq: Every day | ORAL | 2 refills | Status: DC

## 2019-09-12 NOTE — Telephone Encounter (Signed)
The readings are mostly not at goal for you.  Angela Burgess please send in  Amlodipine  2.5 mg 1 po  Per day disp 30 refill x 2   Plan virtual visit  Fu with readings in 2 months   If bp 160 and over or other concerns, contact us in  interim .   If med causes fatigue  Take at bed time.

## 2019-09-13 DIAGNOSIS — N819 Female genital prolapse, unspecified: Secondary | ICD-10-CM | POA: Diagnosis not present

## 2019-09-26 ENCOUNTER — Ambulatory Visit: Payer: Medicare Other | Admitting: Rheumatology

## 2019-10-13 DIAGNOSIS — L218 Other seborrheic dermatitis: Secondary | ICD-10-CM | POA: Diagnosis not present

## 2019-10-13 DIAGNOSIS — C44311 Basal cell carcinoma of skin of nose: Secondary | ICD-10-CM | POA: Diagnosis not present

## 2019-10-17 ENCOUNTER — Other Ambulatory Visit: Payer: Self-pay | Admitting: Internal Medicine

## 2019-11-01 ENCOUNTER — Ambulatory Visit: Payer: Medicare Other | Attending: Internal Medicine

## 2019-11-01 DIAGNOSIS — Z23 Encounter for immunization: Secondary | ICD-10-CM | POA: Insufficient documentation

## 2019-11-01 NOTE — Progress Notes (Signed)
Pt had vaccine around 1300.  She waited the 15 minutes and left the coliseum.  Pt started to feel jittery, flushed and tingles around the mouth and returned to the observation area at 1325.  At that time her BP was 178/86 p 84 skin color and color of lips wnl.  Pt had just been started on hypertension medication about 2 months ago.  Pt does not remember the name of the medication.  Pt also states that she has had a lot of stress at home.  Husband is bipolar, recently diagnosed with cancer and has started to drink.  While pt was sitting down a fan was used to cool pt.  Pt states she did not want to go to the hospital when asked.  1415 Pt states she feels better BP 160/78.  Pt has a virtual visit with doctor in 2 weeks.  Copy of BP's given to pt to pass on to PCP. Pt left coliseum ambulatory with no apparent distress

## 2019-11-01 NOTE — Progress Notes (Signed)
   Covid-19 Vaccination Clinic  Name:  Angela Burgess    MRN: [id number] DOB: Oct 15, 1940  11/01/2019  Angela Burgess was observed post Covid-19 immunization for 15 minutes without incidence. She was provided with Vaccine Information Sheet and instruction to access the V-Safe system.   Angela Burgess was instructed to call 911 with any severe reactions post vaccine: [name] Difficulty breathing  . Swelling of your face and throat  . A fast heartbeat  . A bad rash all over your body  . Dizziness and weakness    Immunizations Administered    Name Date Dose VIS Date Route   Pfizer COVID-19 Vaccine 11/01/2019 12:52 PM 0.3 mL 09/02/2019 Intramuscular   Manufacturer: [place]   Lot: [id number]   [place]: [id number]

## 2019-11-03 ENCOUNTER — Ambulatory Visit: Payer: Medicare Other

## 2019-11-04 NOTE — Progress Notes (Deleted)
Office Visit Note  Patient: Angela Burgess             Date of Birth: [birth year]           MRN: [id number]             PCP: Burnis Medin, MD Referring: Burnis Medin, MD Visit Date: 11/10/2019 Occupation: @GUAROCC @  Subjective:  No chief complaint on file.   History of Present Illness: CLARITY WI is a 79 y.o. female ***   Activities of Daily Living:  Patient reports morning stiffness for *** {minute/hour:19697}.   Patient {ACTIONS;DENIES/REPORTS:21021675::"Denies"} nocturnal pain.  Difficulty dressing/grooming: {ACTIONS;DENIES/REPORTS:21021675::"Denies"} Difficulty climbing stairs: {ACTIONS;DENIES/REPORTS:21021675::"Denies"} Difficulty getting out of chair: {ACTIONS;DENIES/REPORTS:21021675::"Denies"} Difficulty using hands for taps, buttons, cutlery, and/or writing: {ACTIONS;DENIES/REPORTS:21021675::"Denies"}  No Rheumatology ROS completed.   PMFS History:  Patient Active Problem List   Diagnosis Date Noted  . Primary osteoarthritis of both hands 12/20/2016  . Primary osteoarthritis of both knees 12/20/2016  . Primary osteoarthritis of both feet 12/20/2016  . Hypothyroidism 09/05/2014  . Hyperlipidemia 09/05/2014  . Medicare annual wellness visit, subsequent 09/05/2014  . Lumbar spondylosis 07/07/2014  . 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 Visit for preventive health examination 08/31/2013  . Hair loss 01/20/2013  . Elevated MCV 07/22/2012  . Preventative health care 04/02/2011  . IRRITABLE BOWEL SYNDROME 07/10/2010  . GASTRITIS 07/04/2010  . RHINITIS 05/21/2010  . VERTIGO 05/21/2010  . VARICOSE VEINS, LOWER EXTREMITIES 11/13/2009  . OTHER DISEASES OF NASAL CAVITY AND SINUSES 01/31/2008  . HYPOTHYROIDISM 08/31/2007  . HYPERLIPIDEMIA 08/31/2007  . OTHER SPEC DISEASES BLOOD&BLOOD-FORMING ORGANS 08/31/2007  . ROSACEA 08/31/2007  . Osteoarthritis of right hip 08/31/2007  . OSTEOPENIA 08/31/2007    Past Medical History:  Diagnosis Date  . Anemia   .  Arthritis   . Cervical cancer ([place]) 1968  . Dexamethasone adverse reaction 2008  . Elevated MCV    for years  . Eye exam abnormal 6/09  . Family history of malignant neoplasm of gastrointestinal tract   . Gastritis   . History of mammogram 1/10  . History of normal resting EKG 2005  . Hyperlipidemia   . Hypothyroidism   . IBS (irritable bowel syndrome)   . Macrocytosis    with theme eval in past and normal b12  . Osteopenia   . Rectocele   . Rosacea   . Tetanus-diphtheria (Td) vaccination 1999    Family History  Problem Relation Age of Onset  . Cirrhosis Mother   . Kidney disease Mother   . Allergy (severe) Mother        protein allergy that sent posion to her brain  . Leukemia Father   . Heart attack Father 22  . Diabetes Father   . Bipolar disorder Sister   . Stroke Sister   . Lung cancer Brother        smoker  . Seizures Brother   . Bipolar disorder Son   . Colon cancer Brother   . Seizures Sister   . Seizures Sister   . Breast cancer Maternal Aunt   . Breast cancer Cousin   . Lung cancer Cousin    Past Surgical History:  Procedure Laterality Date  . ABDOMINAL HYSTERECTOMY    . APPENDECTOMY    . BREAST EXCISIONAL BIOPSY Right    Lump Removed   . 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 Influenza Split 06/22/2013  . Influenza Whole 07/07/2007, 07/05/2008, 07/23/2009, 05/21/2010  . Influenza, High Dose Seasonal PF 08/03/2017, 08/29/2018  . Influenza,inj,Quad PF,6+ Mos 07/24/2016  . Influenza-Unspecified 07/06/2014  . PFIZER SARS-COV-2 Vaccination 11/01/2019  . Pneumococcal Conjugate-13 12/21/2013  . Pneumococcal Polysaccharide-23 01/31/2008  . Td 09/22/1997, 11/13/2009  . Zoster 05/21/2010     Objective: Vital  Signs: There were no vitals taken for this visit.   Physical Exam   Musculoskeletal Exam: ***  CDAI Exam: CDAI Score: - Patient Global: -; Provider Global: - Swollen: -; Tender: - Joint Exam 11/10/2019   No joint exam has been documented for this visit   There is currently no information documented on the homunculus. Go to the Rheumatology activity and complete the homunculus joint exam.  Investigation: No additional findings.  Imaging: No results found.  Recent Labs: Lab Results  Component Value Date   WBC 4.0 08/26/2019   HGB 12.3 08/26/2019   PLT 216 08/26/2019   NA 142 08/26/2019   K 4.7 08/26/2019   CL 103 08/26/2019   CO2 31 08/26/2019   GLUCOSE 123 (H) 08/26/2019   BUN 16 08/26/2019   CREATININE 0.86 08/26/2019   BILITOT 0.5 08/26/2019   ALKPHOS 63 08/26/2019   AST 16 08/26/2019   ALT 11 08/26/2019   PROT 7.1 08/26/2019   ALBUMIN 4.4 08/26/2019   CALCIUM 9.6 08/26/2019   GFRAA >60 08/26/2019    Speciality Comments: No specialty comments available.  Procedures:  No procedures performed Allergies: Tetracycline   Assessment / Plan:     Visit Diagnoses: No diagnosis found.  Orders: No orders of the defined types were placed in this encounter.  No orders of the defined types were placed in this encounter.   Face-to-face time spent with patient was *** minutes. Greater than 50% of time was spent in counseling and coordination of care.  Follow-Up Instructions: No follow-ups on file.   Ofilia Neas, PA-C  Note - This record has been created using Dragon software.  Chart creation errors have been sought, but may not always  have been located. Such creation errors do not reflect on  the standard of medical care.

## 2019-11-10 ENCOUNTER — Ambulatory Visit: Payer: Medicare Other | Admitting: Physician Assistant

## 2019-11-14 ENCOUNTER — Telehealth (INDEPENDENT_AMBULATORY_CARE_PROVIDER_SITE_OTHER): Payer: Medicare Other | Admitting: Internal Medicine

## 2019-11-14 ENCOUNTER — Encounter: Payer: Self-pay | Admitting: Internal Medicine

## 2019-11-14 ENCOUNTER — Other Ambulatory Visit: Payer: Self-pay

## 2019-11-14 DIAGNOSIS — Z79899 Other long term (current) drug therapy: Secondary | ICD-10-CM | POA: Diagnosis not present

## 2019-11-14 DIAGNOSIS — I10 Essential (primary) hypertension: Secondary | ICD-10-CM | POA: Diagnosis not present

## 2019-11-14 DIAGNOSIS — F439 Reaction to severe stress, unspecified: Secondary | ICD-10-CM

## 2019-11-14 MED ORDER — AMLODIPINE BESYLATE 2.5 MG PO TABS: 2.5000 mg | ORAL_TABLET | Freq: Every day | ORAL | 1 refills | Status: DC

## 2019-11-14 NOTE — Progress Notes (Signed)
Office Visit Note  Patient: Angela Burgess             Date of Birth: 11-08-1940           MRN: [id number]             PCP: Burnis Medin, MD Referring: Burnis Medin, MD Visit Date: 11/17/2019 Occupation: @GUAROCC @  Subjective:  Low back pain, hand pain   History of Present Illness: Angela Burgess is a 79 y.o. female with a past medical history of hereditary hemochromatosis and osteoarthritis. She continues to follow up with her hematologist every 3 months. Phlebotomy is performed every 6 months and she states that she should be getting this done soon. She has persistent lower back pain and bilateral hand pain. She states that the pain has increased slightly in intensity since her last visit. However, she states that she is currently not in "a lot of pain from her joints." She also endorses occasional palpitations while laying down at night for the past year. She has continued tai chi workouts and at home workouts. She states that she will continue to work on her posture to help alleviate her lower back pain. She uses Voltaren gel as needed for joint pain.     Activities of Daily Living:  Patient reports morning stiffness for 0 minutes.   Patient Reports nocturnal pain.  Difficulty dressing/grooming: Denies Difficulty climbing stairs: Denies Difficulty getting out of chair: Denies Difficulty using hands for taps, buttons, cutlery, and/or writing: Reports  Review of Systems  Constitutional: Negative for fatigue.  HENT: Positive for nose dryness. Negative for mouth sores and mouth dryness.   Eyes: Positive for dryness.  Respiratory: Negative for shortness of breath and difficulty breathing.   Cardiovascular: Negative for chest pain and palpitations.  Gastrointestinal: Negative for blood in stool, constipation and diarrhea.  Endocrine: Negative for increased urination.  Genitourinary: Negative for difficulty urinating and painful urination.  Musculoskeletal: Positive for  arthralgias and joint pain. Negative for joint swelling and morning stiffness.  Skin: Negative for color change and rash.  Allergic/Immunologic: Negative for susceptible to infections.  Neurological: Negative for dizziness, headaches, memory loss and weakness.  Hematological: Negative for bruising/bleeding tendency.  Psychiatric/Behavioral: Negative for confusion and sleep disturbance.    PMFS History:  Patient Active Problem List   Diagnosis Date Noted  . Primary osteoarthritis of both hands 12/20/2016  . Primary osteoarthritis of both knees 12/20/2016  . Primary osteoarthritis of both feet 12/20/2016  . Hypothyroidism 09/05/2014  . Hyperlipidemia 09/05/2014  . Medicare annual wellness visit, subsequent 09/05/2014  . Lumbar spondylosis 07/07/2014  . 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 Visit for preventive health examination 08/31/2013  . Hair loss 01/20/2013  . Elevated MCV 07/22/2012  . Preventative health care 04/02/2011  . IRRITABLE BOWEL SYNDROME 07/10/2010  . GASTRITIS 07/04/2010  . RHINITIS 05/21/2010  . VERTIGO 05/21/2010  . VARICOSE VEINS, LOWER EXTREMITIES 11/13/2009  . OTHER DISEASES OF NASAL CAVITY AND SINUSES 01/31/2008  . HYPOTHYROIDISM 08/31/2007  . HYPERLIPIDEMIA 08/31/2007  . OTHER SPEC DISEASES BLOOD&BLOOD-FORMING ORGANS 08/31/2007  . ROSACEA 08/31/2007  . Osteoarthritis of right hip 08/31/2007  . OSTEOPENIA 08/31/2007    Past Medical History:  Diagnosis Date  . Anemia   . Arthritis   . Cervical cancer ([place]) 1968  . Dexamethasone adverse reaction 2008  . Elevated MCV    for years  . Eye exam abnormal 6/09  . Family history of malignant neoplasm of gastrointestinal tract   . Gastritis   .  History of mammogram 1/10  . History of normal resting EKG 2005  . Hyperlipidemia   . Hypothyroidism   . IBS (irritable bowel syndrome)   . Macrocytosis    with theme eval in past and normal b12  . Osteopenia   . Rectocele   . Rosacea   . Tetanus-diphtheria (Td)  vaccination 1999    Family History  Problem Relation Age of Onset  . Cirrhosis Mother   . Kidney disease Mother   . Allergy (severe) Mother        protein allergy that sent posion to her brain  . Leukemia Father   . Heart attack Father 72  . Diabetes Father   . Bipolar disorder Sister   . Stroke Sister   . Lung cancer Brother        smoker  . Colon cancer Brother   . Bipolar disorder Son   . Seizures Sister   . Seizures Sister   . Breast cancer Maternal Aunt   . Breast cancer Cousin   . Lung cancer Cousin   . Healthy Daughter   . Seizures Brother    Past Surgical History:  Procedure Laterality Date  . ABDOMINAL HYSTERECTOMY    . APPENDECTOMY    . BREAST EXCISIONAL BIOPSY Right    Lump Removed   . 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 Influenza Split 06/22/2013  . Influenza Whole 07/07/2007, 07/05/2008, 07/23/2009, 05/21/2010  . Influenza, High Dose Seasonal PF 08/03/2017, 08/29/2018  . Influenza,inj,Quad PF,6+ Mos 07/24/2016  . Influenza-Unspecified 07/06/2014  . PFIZER SARS-COV-2 Vaccination 11/01/2019  . Pneumococcal Conjugate-13 12/21/2013  . Pneumococcal Polysaccharide-23 01/31/2008  . Td 09/22/1997, 11/13/2009  . Zoster 05/21/2010     Objective: Vital Signs: BP 139/66 (BP Location: Left Arm, Patient Position: Sitting, Cuff Size: Normal)   Pulse 73   Resp 12   Ht 5' 4"  (1.626 m)   Wt 118 lb 12.8 oz (53.9 kg)   BMI 20.39 kg/m    Physical Exam Vitals and nursing note reviewed.  Constitutional:      General: She is not in acute distress. HENT:     Head: Normocephalic and atraumatic.  Eyes:     Extraocular Movements: Extraocular movements intact.     Conjunctiva/sclera: Conjunctivae normal.  Cardiovascular:      Rate and Rhythm: Normal rate.  Pulmonary:     Effort: Pulmonary effort is normal.  Abdominal:     General: Abdomen is flat.     Palpations: Abdomen is soft.  Musculoskeletal:        General: No swelling or tenderness.     Cervical back: Normal range of motion and neck supple.     Right lower leg: No edema.     Left lower leg: No edema.  Skin:    General: Skin is warm and dry.  Neurological:     Mental Status: She is alert and oriented to person, place, and time.  Psychiatric:        Behavior: Behavior normal.     Musculoskeletal Exam: C-spine slightly limited ROM with lateral rotation.  She had thoracic kyphosis.  Thoracic and lumbar spine with good ROM. No spinal tenderness or SI joint  tenderness. Shoulder joints, elbow joints, wrist joints, MCPs, PIPs, and DIPs good ROM. Bilateral PIP and DIP synovial thickening consistent with osteoarthritis of hands.Bilateral CMC synovial joint thickening. No evidence of inflammation or synovitis in bilateral hands. Hip joints have limited ROM and some discomfort more so on the right side. Knee joints, ankle joints, MTPs, PIPs, and DIPs good ROM. No warmth or effusion of bilateral knee joints. No tenderness or swelling of ankle joints. Bunions noted on bilateral 1st MTP joints.   CDAI Exam: CDAI Score: - Patient Global: -; Provider Global: - Swollen: -; Tender: - Joint Exam 11/17/2019   No joint exam has been documented for this visit   There is currently no information documented on the homunculus. Go to the Rheumatology activity and complete the homunculus joint exam.  Investigation: No additional findings.  Imaging: No results found.  Recent Labs: Lab Results  Component Value Date   WBC 4.0 08/26/2019   HGB 12.3 08/26/2019   PLT 216 08/26/2019   NA 142 08/26/2019   K 4.7 08/26/2019   CL 103 08/26/2019   CO2 31 08/26/2019   GLUCOSE 123 (H) 08/26/2019   BUN 16 08/26/2019   CREATININE 0.86 08/26/2019   BILITOT 0.5 08/26/2019    ALKPHOS 63 08/26/2019   AST 16 08/26/2019   ALT 11 08/26/2019   PROT 7.1 08/26/2019   ALBUMIN 4.4 08/26/2019   CALCIUM 9.6 08/26/2019   GFRAA >60 08/26/2019    Speciality Comments: No specialty comments available.  Procedures:  No procedures performed Allergies: Tetracycline and Fluocinonide   Assessment / Plan:     Visit Diagnoses: Hereditary hemochromatosis ([place]) - She continues to follow-up with her hematologist, Dr. Marin Olp, every 3 months. She receives phlebotomy every 6 months and states that she will be having this done soon. She denies any complications at this time. She denies any shortness of breath or chest pain. She endorses occasional palpitations while laying down at night that have been occurring since the past year. She will continue to follow up with her hematologist every 3 months.   Primary osteoarthritis of both hands - Bilateral PIP and DIP synovial thickening.No inflammation or synovitis present at this time. She has noticed a slight increase in the pain in her hands since her last visit. Education was provided about joint protection and muscle strengthening. Hand exercises were provided to perform at home.   Primary osteoarthritis of both knees - Good ROM with no discomfort. No warmth or effusion noted. She denies any knee joint pain recently. She continue to perform tai chi at home and has started home workouts this week. She uses Voltaren gel as needed to aid in pain relief.  Primary osteoarthritis of both feet - Currently denies any pain in her feet at this time. She has no joint swelling. Bilateral bunions noted on the 1st MTPs. She wears proper fitting shoes.  Primary osteoarthritis of right hip - She endorses occasional discomfort with her right hip. There was decreased ROM of bilateral hips with some discomfort more so on the right side. She uses cream to help with the pain occasionally.   DDD (degenerative disc disease), lumbar - Persistent lumbar back pain  that has increased slightly in intensity. She attests this to poor posture. She has good ROM with no discomfort at this time. No midline spinal tenderness of SI joint tenderness. She continues to perform tai chi and workouts at home. She states that she will continue to work on her posture. Muscle strengthening exercises  were discussed and provided.   Thoracic kyphosis-exercises were demonstrated in the office.  Her handout on back exercises was given.  Osteopenia of multiple sites - Her gynecologist orders DEXA.  Other medical conditions are listed as follows:  History of hypothyroidism  Rosacea  History of IBS  History of hyperlipidemia  Orders: No orders of the defined types were placed in this encounter.  No orders of the defined types were placed in this encounter.    Follow-Up Instructions: Return in about 6 months (around 05/16/2020) for Osteoarthritis.   Bo Merino, MD  Note - This record has been created using Editor, commissioning.  Chart creation errors have been sought, but may not always  have been located. Such creation errors do not reflect on  the standard of medical care.

## 2019-11-14 NOTE — Telephone Encounter (Signed)
See Virtual visit on Feb 22

## 2019-11-14 NOTE — Progress Notes (Signed)
Virtual Visit via Video Note  I connected with@ on 11/14/19 at  9:30 AM EST by a video enabled telemedicine application and verified that I am speaking with the correct person using two identifiers. Location patient: home Location provider:work  office Persons participating in the virtual visit: patient, provider  WIth national recommendations  regarding COVID 19 pandemic   video visit is advised over in office visit for this patient.  Patient aware  of the limitations of evaluation and management by telemedicine and  availability of in person appointments. and agreed to proceed.   HPI: Angela Burgess presents for video visit  Fu bp  Readings in record  Mostly high [id number] and [id number] diastoilic in hgih [id number]  Low 80s  feling ok without side effects  Had elevated Bp readings  After first covid vaccine  When had itching and feeling flushed  bp was 170  And then 160 over 86 and 77  But ok after  Had about 3 days of fatigue flu sx .    Angela Burgess had still stress with sons difficulties  But coping ok   Has had low bps  In past with post phlebotomy so  Gets IVF to maintain    No new  Sx ocas flutterwo sx  going to bed in evening .  No chage in exercise tolerance  Getting back to exercise bike   ROS: See pertinent positives and negatives per HPI.  Past Medical History:  Diagnosis Date  . Anemia   . Arthritis   . Cervical cancer ([place]) 1968  . Dexamethasone adverse reaction 2008  . Elevated MCV    for years  . Eye exam abnormal 6/09  . Family history of malignant neoplasm of gastrointestinal tract   . Gastritis   . History of mammogram 1/10  . History of normal resting EKG 2005  . Hyperlipidemia   . Hypothyroidism   . IBS (irritable bowel syndrome)   . Macrocytosis    with theme eval in past and normal b12  . Osteopenia   . Rectocele   . Rosacea   . Tetanus-diphtheria (Td) vaccination 1999    Past Surgical History:  Procedure Laterality Date  . ABDOMINAL HYSTERECTOMY    . APPENDECTOMY     . BREAST EXCISIONAL BIOPSY Right    Lump Removed   . CATARACT EXTRACTION, BILATERAL Bilateral    one in june and one in july; lens implant     Family History  Problem Relation Age of Onset  . Cirrhosis Mother   . Kidney disease Mother   . Allergy (severe) Mother        protein allergy that sent posion to her brain  . Leukemia Father   . Heart attack Father 59  . Diabetes Father   . Bipolar disorder Sister   . Stroke Sister   . Lung cancer Brother        smoker  . Seizures Brother   . Bipolar disorder Son   . Colon cancer Brother   . Seizures Sister   . Seizures Sister   . Breast cancer Maternal Aunt   . Breast cancer Cousin   . Lung cancer Cousin     Social History   Tobacco Use  . Smoking status: Never Smoker  . Smokeless tobacco: Never Used  . Tobacco comment: never used tobacco  Substance Use Topics  . Alcohol use: Not Currently    Alcohol/week: 0.0 standard drinks    Comment: none now   . Drug  use: Never      Current Outpatient Medications:  .  amLODipine (NORVASC) 2.5 MG tablet, Take 1 tablet (2.5 mg total) by mouth daily., Disp: 90 tablet, Rfl: 1 .  aspirin 81 MG tablet, Take 81 mg by mouth daily., Disp: , Rfl:  .  Astaxanthin 4 MG CAPS, Take 1 capsule by mouth 2 (two) times daily., Disp: , Rfl:  .  Calcium Carbonate-Vitamin D (CALCIUM 500/D) 500-125 MG-UNIT TABS, Take by mouth.  , Disp: , Rfl:  .  cetirizine (ZYRTEC) 10 MG tablet, Take by mouth., Disp: , Rfl:  .  Cholecalciferol (VITAMIN D3) 2000 UNITS TABS, Take by mouth every morning., Disp: , Rfl:  .  diclofenac sodium (VOLTAREN) 1 % GEL, 3 grams to 3 large joints up to three times a day, Disp: 3 Tube, Rfl: 3 .  estradiol (ESTRACE) 0.1 MG/GM vaginal cream, Place 1 Applicatorful vaginally 2 (two) times a week. , Disp: , Rfl:  .  levothyroxine (SYNTHROID) 112 MCG tablet, TAKE 1 TABLET BY MOUTH EVERY DAY, Disp: 90 tablet, Rfl: 1 .  metroNIDAZOLE (METROCREAM) 0.75 % cream, Apply 1 application topically  daily.  , Disp: , Rfl:  .  Multiple Vitamin (MULTIVITAMIN) tablet, Take 1 tablet by mouth daily.  , Disp: , Rfl:   EXAM: BP Readings from Last 3 Encounters:  08/30/19 (!) 177/65  06/17/19 (!) 150/68  05/19/19 (!) 158/60  sample of blood pressure since  i have been taking blood pressure medication 139/75,  146/76,  143/75, 142/70, 140/80, 131/78, 136/66, 144/79, 141/81, 136/72, 144/78, 146/79,, 146/83,  133/75, 131/69, 151/81, 147/77, 131/66, 150/83, 137/81 149/79, 145/74, 134/83, 143/77, 139/72, 136/75   VITALS per patient if applicable: see reports   GENERAL: alert, oriented, appears well and in no acute distress  HEENT: atraumatic, conjunttiva clear, no obvious abnormalities on inspection of external nose and ears  NECK: normal movements of the head and neck  LUNGS: on inspection no signs of respiratory distress, breathing rate appears normal, no obvious gross SOB, gasping or wheezing  CV: no obvious cyanosis   PSYCH/NEURO: pleasant and cooperative, no obvious depression or anxiety, speech and thought processing grossly intact Lab Results  Component Value Date   WBC 4.0 08/26/2019   HGB 12.3 08/26/2019   HCT 35.7 (L) 08/26/2019   PLT 216 08/26/2019   GLUCOSE 123 (H) 08/26/2019   CHOL 223 (H) 10/29/2018   TRIG 61.0 10/29/2018   HDL 63.10 10/29/2018   LDLDIRECT 165.2 07/21/2012   LDLCALC 147 (H) 10/29/2018   ALT 11 08/26/2019   AST 16 08/26/2019   NA 142 08/26/2019   K 4.7 08/26/2019   CL 103 08/26/2019   CREATININE 0.86 08/26/2019   BUN 16 08/26/2019   CO2 31 08/26/2019   TSH 1.17 10/29/2018   HGBA1C 5.1 09/18/2015    ASSESSMENT AND PLAN:  Discussed the following assessment and plan:    ICD-10-CM   1. Essential hypertension  I10   2. Medication management  Z79.899   3. Stress  F43.9    Pt prefers to not go to low on bp goal with hs of Low bp   After phebotomy  Stress  Surges  But treating baseline  Continue amlodipine 2.5 for now Send in readings in  another month Plan in person cpx in next 4-6 months or as needed  Counseled.   Expectant management and discussion of plan and treatment with opportunity to ask questions and all were answered. The patient agreed with the plan and demonstrated  an understanding of the instructions.   Advised to call back or seek an in-person evaluation if worsening  or having  further concerns . Return for send in readings 1 mos and cpx in 4-6 mos.    Shanon Ace, MD

## 2019-11-17 ENCOUNTER — Other Ambulatory Visit: Payer: Self-pay

## 2019-11-17 ENCOUNTER — Ambulatory Visit: Payer: Medicare Other | Admitting: Rheumatology

## 2019-11-17 ENCOUNTER — Encounter: Payer: Self-pay | Admitting: Physician Assistant

## 2019-11-17 DIAGNOSIS — M19071 Primary osteoarthritis, right ankle and foot: Secondary | ICD-10-CM | POA: Diagnosis not present

## 2019-11-17 DIAGNOSIS — M19041 Primary osteoarthritis, right hand: Secondary | ICD-10-CM

## 2019-11-17 DIAGNOSIS — L719 Rosacea, unspecified: Secondary | ICD-10-CM

## 2019-11-17 DIAGNOSIS — M1611 Unilateral primary osteoarthritis, right hip: Secondary | ICD-10-CM | POA: Diagnosis not present

## 2019-11-17 DIAGNOSIS — Z8639 Personal history of other endocrine, nutritional and metabolic disease: Secondary | ICD-10-CM

## 2019-11-17 DIAGNOSIS — M19042 Primary osteoarthritis, left hand: Secondary | ICD-10-CM

## 2019-11-17 DIAGNOSIS — M8589 Other specified disorders of bone density and structure, multiple sites: Secondary | ICD-10-CM

## 2019-11-17 DIAGNOSIS — M4004 Postural kyphosis, thoracic region: Secondary | ICD-10-CM

## 2019-11-17 DIAGNOSIS — M170 Bilateral primary osteoarthritis of knee: Secondary | ICD-10-CM | POA: Diagnosis not present

## 2019-11-17 DIAGNOSIS — M19072 Primary osteoarthritis, left ankle and foot: Secondary | ICD-10-CM

## 2019-11-17 DIAGNOSIS — M5136 Other intervertebral disc degeneration, lumbar region: Secondary | ICD-10-CM

## 2019-11-17 DIAGNOSIS — Z8719 Personal history of other diseases of the digestive system: Secondary | ICD-10-CM

## 2019-11-17 NOTE — Patient Instructions (Signed)
Hand Exercises Hand exercises can be helpful for almost anyone. These exercises can strengthen the hands, improve flexibility and movement, and increase blood flow to the hands. These results can make work and daily tasks easier. Hand exercises can be especially helpful for people who have joint pain from arthritis or have nerve damage from overuse (carpal tunnel syndrome). These exercises can also help people who have injured a hand. Exercises Most of these hand exercises are gentle stretching and motion exercises. It is usually safe to do them often throughout the day. Warming up your hands before exercise may help to reduce stiffness. You can do this with gentle massage or by placing your hands in warm water for 10-15 minutes. It is normal to feel some stretching, pulling, tightness, or mild discomfort as you begin new exercises. This will gradually improve. Stop an exercise right away if you feel sudden, severe pain or your pain gets worse. Ask your health care provider which exercises are best for you. Knuckle bend or "claw" fist 1. Stand or sit with your arm, hand, and all five fingers pointed straight up. Make sure to keep your wrist straight during the exercise. 2. Gently bend your fingers down toward your palm until the tips of your fingers are touching the top of your palm. Keep your big knuckle straight and just bend the small knuckles in your fingers. 3. Hold this position for __________ seconds. 4. Straighten (extend) your fingers back to the starting position. Repeat this exercise 5-10 times with each hand. Full finger fist 1. Stand or sit with your arm, hand, and all five fingers pointed straight up. Make sure to keep your wrist straight during the exercise. 2. Gently bend your fingers into your palm until the tips of your fingers are touching the middle of your palm. 3. Hold this position for __________ seconds. 4. Extend your fingers back to the starting position, stretching every  joint fully. Repeat this exercise 5-10 times with each hand. Straight fist 1. Stand or sit with your arm, hand, and all five fingers pointed straight up. Make sure to keep your wrist straight during the exercise. 2. Gently bend your fingers at the big knuckle, where your fingers meet your hand, and the middle knuckle. Keep the knuckle at the tips of your fingers straight and try to touch the bottom of your palm. 3. Hold this position for __________ seconds. 4. Extend your fingers back to the starting position, stretching every joint fully. Repeat this exercise 5-10 times with each hand. Tabletop 1. Stand or sit with your arm, hand, and all five fingers pointed straight up. Make sure to keep your wrist straight during the exercise. 2. Gently bend your fingers at the big knuckle, where your fingers meet your hand, as far down as you can while keeping the small knuckles in your fingers straight. Think of forming a tabletop with your fingers. 3. Hold this position for __________ seconds. 4. Extend your fingers back to the starting position, stretching every joint fully. Repeat this exercise 5-10 times with each hand. Finger spread 1. Place your hand flat on a table with your palm facing down. Make sure your wrist stays straight as you do this exercise. 2. Spread your fingers and thumb apart from each other as far as you can until you feel a gentle stretch. Hold this position for __________ seconds. 3. Bring your fingers and thumb tight together again. Hold this position for __________ seconds. Repeat this exercise 5-10 times with each hand.   Making circles 1. Stand or sit with your arm, hand, and all five fingers pointed straight up. Make sure to keep your wrist straight during the exercise. 2. Make a circle by touching the tip of your thumb to the tip of your index finger. 3. Hold for __________ seconds. Then open your hand wide. 4. Repeat this motion with your thumb and each finger on your hand.  Repeat this exercise 5-10 times with each hand. Thumb motion 1. Sit with your forearm resting on a table and your wrist straight. Your thumb should be facing up toward the ceiling. Keep your fingers relaxed as you move your thumb. 2. Lift your thumb up as high as you can toward the ceiling. Hold for __________ seconds. 3. Bend your thumb across your palm as far as you can, reaching the tip of your thumb for the small finger (pinkie) side of your palm. Hold for __________ seconds. Repeat this exercise 5-10 times with each hand. Grip strengthening  1. Hold a stress ball or other soft ball in the middle of your hand. 2. Slowly increase the pressure, squeezing the ball as much as you can without causing pain. Think of bringing the tips of your fingers into the middle of your palm. All of your finger joints should bend when doing this exercise. 3. Hold your squeeze for __________ seconds, then relax. Repeat this exercise 5-10 times with each hand. Contact a health care provider if:  Your hand pain or discomfort gets much worse when you do an exercise.  Your hand pain or discomfort does not improve within 2 hours after you exercise. If you have any of these problems, stop doing these exercises right away. Do not do them again unless your health care provider says that you can. Get help right away if:  You develop sudden, severe hand pain or swelling. If this happens, stop doing these exercises right away. Do not do them again unless your health care provider says that you can. This information is not intended to replace advice given to you by your health care provider. Make sure you discuss any questions you have with your health care provider. Document Revised: 12/30/2018 Document Reviewed: 09/09/2018 Elsevier Patient Education  2020 [employer] Back Exercises The following exercises strengthen the muscles that help to support the trunk and back. They also help to keep the lower back  flexible. Doing these exercises can help to prevent back pain or lessen existing pain.  If you have back pain or discomfort, try doing these exercises 2-3 times each day or as told by your health care provider.  As your pain improves, do them once each day, but increase the number of times that you repeat the steps for each exercise (do more repetitions).  To prevent the recurrence of back pain, continue to do these exercises once each day or as told by your health care provider. Do exercises exactly as told by your health care provider and adjust them as directed. It is normal to feel mild stretching, pulling, tightness, or discomfort as you do these exercises, but you should stop right away if you feel sudden pain or your pain gets worse. Exercises Single knee to chest Repeat these steps 3-5 times for each leg: 1. Lie on your back on a firm bed or the floor with your legs extended. 2. Bring one knee to your chest. Your other leg should stay extended and in contact with the floor. 3. Hold your knee in place by grabbing   your knee or thigh with both hands and hold. 4. Pull on your knee until you feel a gentle stretch in your lower back or buttocks. 5. Hold the stretch for 10-30 seconds. 6. Slowly release and straighten your leg. Pelvic tilt Repeat these steps 5-10 times: 1. Lie on your back on a firm bed or the floor with your legs extended. 2. Bend your knees so they are pointing toward the ceiling and your feet are flat on the floor. 3. Tighten your lower abdominal muscles to press your lower back against the floor. This motion will tilt your pelvis so your tailbone points up toward the ceiling instead of pointing to your feet or the floor. 4. With gentle tension and even breathing, hold this position for 5-10 seconds. Cat-cow Repeat these steps until your lower back becomes more flexible: 1. Get into a hands-and-knees position on a firm surface. Keep your hands under your shoulders, and  keep your knees under your hips. You may place padding under your knees for comfort. 2. Let your head hang down toward your chest. Contract your abdominal muscles and point your tailbone toward the floor so your lower back becomes rounded like the back of a cat. 3. Hold this position for 5 seconds. 4. Slowly lift your head, let your abdominal muscles relax and point your tailbone up toward the ceiling so your back forms a sagging arch like the back of a cow. 5. Hold this position for 5 seconds.  Press-ups Repeat these steps 5-10 times: 1. Lie on your abdomen (face-down) on the floor. 2. Place your palms near your head, about shoulder-width apart. 3. Keeping your back as relaxed as possible and keeping your hips on the floor, slowly straighten your arms to raise the top half of your body and lift your shoulders. Do not use your back muscles to raise your upper torso. You may adjust the placement of your hands to make yourself more comfortable. 4. Hold this position for 5 seconds while you keep your back relaxed. 5. Slowly return to lying flat on the floor.  Bridges Repeat these steps 10 times: 1. Lie on your back on a firm surface. 2. Bend your knees so they are pointing toward the ceiling and your feet are flat on the floor. Your arms should be flat at your sides, next to your body. 3. Tighten your buttocks muscles and lift your buttocks off the floor until your waist is at almost the same height as your knees. You should feel the muscles working in your buttocks and the back of your thighs. If you do not feel these muscles, slide your feet 1-2 inches farther away from your buttocks. 4. Hold this position for 3-5 seconds. 5. Slowly lower your hips to the starting position, and allow your buttocks muscles to relax completely. If this exercise is too easy, try doing it with your arms crossed over your chest. Abdominal crunches Repeat these steps 5-10 times: 1. Lie on your back on a firm bed or  the floor with your legs extended. 2. Bend your knees so they are pointing toward the ceiling and your feet are flat on the floor. 3. Cross your arms over your chest. 4. Tip your chin slightly toward your chest without bending your neck. 5. Tighten your abdominal muscles and slowly raise your trunk (torso) high enough to lift your shoulder blades a tiny bit off the floor. Avoid raising your torso higher than that because it can put too much stress on   your low back and does not help to strengthen your abdominal muscles. 6. Slowly return to your starting position. Back lifts Repeat these steps 5-10 times: 1. Lie on your abdomen (face-down) with your arms at your sides, and rest your forehead on the floor. 2. Tighten the muscles in your legs and your buttocks. 3. Slowly lift your chest off the floor while you keep your hips pressed to the floor. Keep the back of your head in line with the curve in your back. Your eyes should be looking at the floor. 4. Hold this position for 3-5 seconds. 5. Slowly return to your starting position. Contact a health care provider if:  Your back pain or discomfort gets much worse when you do an exercise.  Your worsening back pain or discomfort does not lessen within 2 hours after you exercise. If you have any of these problems, stop doing these exercises right away. Do not do them again unless your health care provider says that you can. Get help right away if:  You develop sudden, severe back pain. If this happens, stop doing the exercises right away. Do not do them again unless your health care provider says that you can. This information is not intended to replace advice given to you by your health care provider. Make sure you discuss any questions you have with your health care provider. Document Revised: 01/13/2019 Document Reviewed: 06/10/2018 Elsevier Patient Education  2020 [employer] Inc.  

## 2019-11-23 DIAGNOSIS — H9311 Tinnitus, right ear: Secondary | ICD-10-CM | POA: Diagnosis not present

## 2019-11-23 DIAGNOSIS — H6123 Impacted cerumen, bilateral: Secondary | ICD-10-CM | POA: Diagnosis not present

## 2019-11-26 ENCOUNTER — Ambulatory Visit: Payer: Medicare Other | Attending: Internal Medicine

## 2019-11-26 DIAGNOSIS — Z23 Encounter for immunization: Secondary | ICD-10-CM | POA: Insufficient documentation

## 2019-11-26 NOTE — Progress Notes (Signed)
   Covid-19 Vaccination Clinic  Name:  Angela Burgess    MRN: [id number] DOB: [birth year]  11/26/2019  [name] was observed post Covid-19 immunization for 15 minutes without incident. She was provided with Vaccine Information Sheet and instruction to access the V-Safe system.   [name] was instructed to call 911 with any severe reactions post vaccine: [name] Difficulty breathing  . Swelling of face and throat  . A fast heartbeat  . A bad rash all over body  . Dizziness and weakness   Immunizations Administered    Name Date Dose VIS Date Route   Pfizer COVID-19 Vaccine 11/26/2019  9:24 AM 0.3 mL 09/02/2019 Intramuscular   Manufacturer: [place]   Lot: [id number]   [place]: [id number]

## 2019-11-28 ENCOUNTER — Other Ambulatory Visit: Payer: Medicare Other

## 2019-11-30 ENCOUNTER — Other Ambulatory Visit: Payer: Self-pay | Admitting: Internal Medicine

## 2019-12-05 ENCOUNTER — Other Ambulatory Visit: Payer: Medicare Other

## 2019-12-16 ENCOUNTER — Inpatient Hospital Stay: Payer: Medicare Other | Attending: Hematology & Oncology

## 2019-12-16 ENCOUNTER — Other Ambulatory Visit: Payer: Self-pay

## 2019-12-16 LAB — CBC WITH DIFFERENTIAL (CANCER CENTER ONLY)
Abs Immature Granulocytes: 0.01 10*3/uL (ref 0.00–0.07)
Basophils Absolute: 0.1 10*3/uL (ref 0.0–0.1)
Basophils Relative: 1 %
Eosinophils Absolute: 0.1 10*3/uL (ref 0.0–0.5)
Eosinophils Relative: 3 %
HCT: 35.7 % — ABNORMAL LOW (ref 36.0–46.0)
Hemoglobin: 12.4 g/dL (ref 12.0–15.0)
Immature Granulocytes: 0 %
Lymphocytes Relative: 36 %
Lymphs Abs: 1.5 10*3/uL (ref 0.7–4.0)
MCH: 35.7 pg — ABNORMAL HIGH (ref 26.0–34.0)
MCHC: 34.7 g/dL (ref 30.0–36.0)
MCV: 102.9 fL — ABNORMAL HIGH (ref 80.0–100.0)
Monocytes Absolute: 0.4 10*3/uL (ref 0.1–1.0)
Monocytes Relative: 9 %
Neutro Abs: 2.2 10*3/uL (ref 1.7–7.7)
Neutrophils Relative %: 51 %
Platelet Count: 231 10*3/uL (ref 150–400)
RBC: 3.47 MIL/uL — ABNORMAL LOW (ref 3.87–5.11)
RDW: 11.6 % (ref 11.5–15.5)
WBC Count: 4.2 10*3/uL (ref 4.0–10.5)
nRBC: 0 % (ref 0.0–0.2)

## 2019-12-16 LAB — CMP (CANCER CENTER ONLY)
ALT: 11 U/L (ref 0–44)
AST: 16 U/L (ref 15–41)
Albumin: 4.1 g/dL (ref 3.5–5.0)
Alkaline Phosphatase: 63 U/L (ref 38–126)
Anion gap: 6 (ref 5–15)
BUN: 21 mg/dL (ref 8–23)
CO2: 32 mmol/L (ref 22–32)
Calcium: 9.8 mg/dL (ref 8.9–10.3)
Chloride: 103 mmol/L (ref 98–111)
Creatinine: 0.82 mg/dL (ref 0.44–1.00)
GFR, Est AFR Am: >60 mL/min (ref >60)
GFR, Estimated: >60 mL/min (ref >60)
Glucose, Bld: 128 mg/dL — ABNORMAL HIGH (ref 70–99)
Potassium: 4.2 mmol/L (ref 3.5–5.1)
Sodium: 141 mmol/L (ref 135–145)
Total Bilirubin: 0.5 mg/dL (ref 0.3–1.2)
Total Protein: 6.6 g/dL (ref 6.5–8.1)

## 2019-12-16 LAB — IRON AND TIBC
Iron: 134 ug/dL (ref 41–142)
Saturation Ratios: 58 % — ABNORMAL HIGH (ref 21–57)
TIBC: 231 ug/dL — ABNORMAL LOW (ref 236–444)
UIBC: 97 ug/dL — ABNORMAL LOW (ref 120–384)

## 2019-12-16 LAB — FERRITIN: Ferritin: 20 ng/mL (ref 11–307)

## 2019-12-22 ENCOUNTER — Inpatient Hospital Stay: Payer: Medicare Other | Attending: Hematology & Oncology

## 2019-12-22 ENCOUNTER — Other Ambulatory Visit: Payer: Self-pay

## 2019-12-22 DIAGNOSIS — Z79899 Other long term (current) drug therapy: Secondary | ICD-10-CM | POA: Insufficient documentation

## 2019-12-22 MED ORDER — SODIUM CHLORIDE 0.9 % IV SOLN
Freq: Once | INTRAVENOUS | Status: AC
  Filled 2019-12-22: qty 250

## 2019-12-22 NOTE — Progress Notes (Signed)
Angela Burgess presents today for phlebotomy per MD orders. Phlebotomy procedure started at 0915 and ended at 0925. 520 grams removed. Patient observed for 30 minutes after procedure without any incident. Patient tolerated procedure well. IV needle removed intact.

## 2019-12-22 NOTE — Patient Instructions (Signed)
Therapeutic Phlebotomy Therapeutic phlebotomy is the planned removal of blood from a person's body for the purpose of treating a medical condition. The procedure is similar to donating blood. Usually, about a pint (470 mL, or 0.47 L) of blood is removed. The average adult has 9-12 pints (4.3-5.7 L) of blood in the body. Therapeutic phlebotomy may be used to treat the following medical conditions:  Hemochromatosis. This is a condition in which the blood contains too much iron.  Polycythemia vera. This is a condition in which the blood contains too many red blood cells.  Porphyria cutanea tarda. This is a disease in which an important part of hemoglobin is not made properly. It results in the buildup of abnormal amounts of porphyrins in the body.  Sickle cell disease. This is a condition in which the red blood cells form an abnormal crescent shape rather than a round shape. Tell a health care provider about:  Any allergies you have.  All medicines you are taking, including vitamins, herbs, eye drops, creams, and over-the-counter medicines.  Any problems you or family members have had with anesthetic medicines.  Any blood disorders you have.  Any surgeries you have had.  Any medical conditions you have.  Whether you are pregnant or may be pregnant. What are the risks? Generally, this is a safe procedure. However, problems may occur, including:  Nausea or light-headedness.  Low blood pressure (hypotension).  Soreness, bleeding, swelling, or bruising at the needle insertion site.  Infection. What happens before the procedure?  Follow instructions from your health care provider about eating or drinking restrictions.  Ask your health care provider about: ? Changing or stopping your regular medicines. This is especially important if you are taking diabetes medicines or blood thinners (anticoagulants). ? Taking medicines such as aspirin and ibuprofen. These medicines can thin your  blood. Do not take these medicines unless your health care provider tells you to take them. ? Taking over-the-counter medicines, vitamins, herbs, and supplements.  Wear clothing with sleeves that can be raised above the elbow.  Plan to have someone take you home from the hospital or clinic.  You may have a blood sample taken.  Your blood pressure, pulse rate, and breathing rate will be measured. What happens during the procedure?   To lower your risk of infection: ? Your health care team will wash or sanitize their hands. ? Your skin will be cleaned with an antiseptic.  You may be given a medicine to numb the area (local anesthetic).  A tourniquet will be placed on your arm.  A needle will be inserted into one of your veins.  Tubing and a collection bag will be attached to that needle.  Blood will flow through the needle and tubing into the collection bag.  The collection bag will be placed lower than your arm to allow gravity to help the flow of blood into the bag.  You may be asked to open and close your hand slowly and continually during the entire collection.  After the specified amount of blood has been removed from your body, the collection bag and tubing will be clamped.  The needle will be removed from your vein.  Pressure will be held on the site of the needle insertion to stop the bleeding.  A bandage (dressing) will be placed over the needle insertion site. The procedure may vary among health care providers and hospitals. What happens after the procedure?  Your blood pressure, pulse rate, and breathing rate will be   measured after the procedure.  You will be encouraged to drink fluids.  Your recovery will be assessed and monitored.  You can return to your normal activities as told by your health care provider. Summary  Therapeutic phlebotomy is the planned removal of blood from a person's body for the purpose of treating a medical condition.  Therapeutic  phlebotomy may be used to treat hemochromatosis, polycythemia vera, porphyria cutanea tarda, or sickle cell disease.  In the procedure, a needle is inserted and about a pint (470 mL, or 0.47 L) of blood is removed. The average adult has 9-12 pints (4.3-5.7 L) of blood in the body.  This is generally a safe procedure, but it can sometimes cause problems such as nausea, light-headedness, or low blood pressure (hypotension). This information is not intended to replace advice given to you by your health care provider. Make sure you discuss any questions you have with your health care provider. Document Revised: 09/24/2017 Document Reviewed: 09/24/2017 Elsevier Patient Education  2020 Elsevier Inc.  

## 2020-01-05 ENCOUNTER — Other Ambulatory Visit: Payer: Self-pay | Admitting: Internal Medicine

## 2020-02-14 DIAGNOSIS — L905 Scar conditions and fibrosis of skin: Secondary | ICD-10-CM | POA: Diagnosis not present

## 2020-02-14 DIAGNOSIS — Z85828 Personal history of other malignant neoplasm of skin: Secondary | ICD-10-CM | POA: Diagnosis not present

## 2020-02-14 DIAGNOSIS — Z08 Encounter for follow-up examination after completed treatment for malignant neoplasm: Secondary | ICD-10-CM | POA: Diagnosis not present

## 2020-02-23 DIAGNOSIS — L821 Other seborrheic keratosis: Secondary | ICD-10-CM | POA: Diagnosis not present

## 2020-02-23 DIAGNOSIS — Z85828 Personal history of other malignant neoplasm of skin: Secondary | ICD-10-CM | POA: Diagnosis not present

## 2020-02-28 ENCOUNTER — Inpatient Hospital Stay: Payer: Medicare Other

## 2020-02-28 ENCOUNTER — Other Ambulatory Visit: Payer: Self-pay

## 2020-02-28 ENCOUNTER — Encounter: Payer: Self-pay | Admitting: Hematology & Oncology

## 2020-02-28 ENCOUNTER — Inpatient Hospital Stay: Payer: Medicare Other | Attending: Hematology & Oncology | Admitting: Hematology & Oncology

## 2020-02-28 DIAGNOSIS — Z7982 Long term (current) use of aspirin: Secondary | ICD-10-CM | POA: Diagnosis not present

## 2020-02-28 DIAGNOSIS — Z79899 Other long term (current) drug therapy: Secondary | ICD-10-CM | POA: Insufficient documentation

## 2020-02-28 LAB — CMP (CANCER CENTER ONLY)
ALT: 12 U/L (ref 0–44)
AST: 17 U/L (ref 15–41)
Albumin: 4.3 g/dL (ref 3.5–5.0)
Alkaline Phosphatase: 60 U/L (ref 38–126)
Anion gap: 5 (ref 5–15)
BUN: 19 mg/dL (ref 8–23)
CO2: 32 mmol/L (ref 22–32)
Calcium: 9.9 mg/dL (ref 8.9–10.3)
Chloride: 102 mmol/L (ref 98–111)
Creatinine: 0.86 mg/dL (ref 0.44–1.00)
GFR, Est AFR Am: >60 mL/min (ref >60)
GFR, Estimated: >60 mL/min (ref >60)
Glucose, Bld: 111 mg/dL — ABNORMAL HIGH (ref 70–99)
Potassium: 4.1 mmol/L (ref 3.5–5.1)
Sodium: 139 mmol/L (ref 135–145)
Total Bilirubin: 0.4 mg/dL (ref 0.3–1.2)
Total Protein: 6.9 g/dL (ref 6.5–8.1)

## 2020-02-28 LAB — FERRITIN: Ferritin: 17 ng/mL (ref 11–307)

## 2020-02-28 LAB — IRON AND TIBC
Iron: 121 ug/dL (ref 41–142)
Saturation Ratios: 50 % (ref 21–57)
TIBC: 244 ug/dL (ref 236–444)
UIBC: 122 ug/dL (ref 120–384)

## 2020-02-28 LAB — CBC WITH DIFFERENTIAL (CANCER CENTER ONLY)
Abs Immature Granulocytes: 0.01 10*3/uL (ref 0.00–0.07)
Basophils Absolute: 0 10*3/uL (ref 0.0–0.1)
Basophils Relative: 1 %
Eosinophils Absolute: 0.1 10*3/uL (ref 0.0–0.5)
Eosinophils Relative: 2 %
HCT: 37 % (ref 36.0–46.0)
Hemoglobin: 12.6 g/dL (ref 12.0–15.0)
Immature Granulocytes: 0 %
Lymphocytes Relative: 35 %
Lymphs Abs: 1.1 10*3/uL (ref 0.7–4.0)
MCH: 35 pg — ABNORMAL HIGH (ref 26.0–34.0)
MCHC: 34.1 g/dL (ref 30.0–36.0)
MCV: 102.8 fL — ABNORMAL HIGH (ref 80.0–100.0)
Monocytes Absolute: 0.2 10*3/uL (ref 0.1–1.0)
Monocytes Relative: 8 %
Neutro Abs: 1.7 10*3/uL (ref 1.7–7.7)
Neutrophils Relative %: 54 %
Platelet Count: 212 10*3/uL (ref 150–400)
RBC: 3.6 MIL/uL — ABNORMAL LOW (ref 3.87–5.11)
RDW: 11.1 % — ABNORMAL LOW (ref 11.5–15.5)
WBC Count: 3.1 10*3/uL — ABNORMAL LOW (ref 4.0–10.5)
nRBC: 0 % (ref 0.0–0.2)

## 2020-02-28 NOTE — Progress Notes (Signed)
Hematology and Oncology Follow Up Visit  Angela Burgess [id number] Jan 10, 1941 79 y.o. 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  The bad news is that she will be moving to [place], [place].  Her daughter lives up there.  I suspect she probably will be moving with then couple months.  It has been a true pleasure having to help her out.  She is done incredibly well with the hemochromatosis.  We really have not had any problems with the hemochromatosis.  She was phlebotomized I think back in March when her iron saturation was over 50% in 1 and was actually 58%.  She has had no issues with the coronavirus.  She had her vaccines.  She has had no change in bowel or bladder habits.  She has had no nausea or vomiting.  She has had no rashes.  Is been no leg swelling.  Overall, her performance status is ECOG 0.   Medications:  Allergies as of 02/28/2020      Reactions   Tetracycline Rash   Fluocinonide Rash      Medication List       Accurate as of February 28, 2020  9:47 AM. If you have any questions, ask your nurse or doctor.        amLODipine 2.5 MG tablet Commonly known as: NORVASC TAKE 1 TABLET(2.5 MG) BY MOUTH DAILY   aspirin 81 MG tablet Take 81 mg by mouth daily.   Astaxanthin 4 MG Caps Take 1 capsule by mouth 2 (two) times daily.   Calcium 500/D 500-125 MG-UNIT Tabs Generic drug: Calcium Carbonate-Vitamin D Take by mouth.   cetirizine 10 MG tablet Commonly known as: ZYRTEC Take by mouth.   diclofenac sodium 1 % Gel Commonly known as: VOLTAREN 3 grams to 3 large joints up to three times a day   estradiol 0.1 MG/GM vaginal cream Commonly known as: ESTRACE Place 1 Applicatorful vaginally 2 (two) times a week.   levothyroxine 112 MCG tablet Commonly known as: SYNTHROID TAKE 1  TABLET BY MOUTH EVERY DAY   metroNIDAZOLE 0.75 % cream Commonly known as: METROCREAM Apply 1 application topically daily.   multivitamin tablet Take 1 tablet by mouth daily.   Vitamin D3 50 MCG (2000 UT) Tabs Take by mouth every morning.       Allergies:  Allergies  Allergen Reactions  . Tetracycline Rash  . Fluocinonide Rash    Past Medical History, Surgical history, Social history, and Family History were reviewed and updated.  Review of Systems: Review of Systems  Constitutional: Negative.   HENT: Negative.   Eyes: Negative.   Respiratory: Negative.   Cardiovascular: Negative.   Gastrointestinal: Negative.   Genitourinary: Negative.   Musculoskeletal: Negative.   Skin: Negative.   Neurological: Negative.   Endo/Heme/Allergies: Negative.   Psychiatric/Behavioral: Negative.      Physical Exam:  height is 5\' 4"  (1.626 m) and weight is 115 lb 1.3 oz (52.2 kg). Her temporal temperature is 97.5 F (36.4 C) (abnormal). Her blood pressure is 139/46 (abnormal) and her pulse is 69. Her respiration is 18 and oxygen saturation is 100%.   Wt Readings from Last 3 Encounters:  02/28/20 115 lb 1.3 oz (52.2 kg)  11/17/19 118 lb 12.8 oz (53.9 kg)  08/30/19 114 lb (51.7 kg)    Physical  Exam Vitals reviewed.  HENT:     Head: Normocephalic and atraumatic.  Eyes:     Pupils: Pupils are equal, round, and reactive to light.  Cardiovascular:     Rate and Rhythm: Normal rate and regular rhythm.     Heart sounds: Normal heart sounds.  Pulmonary:     Effort: Pulmonary effort is normal.     Breath sounds: Normal breath sounds.  Abdominal:     General: Bowel sounds are normal.     Palpations: Abdomen is soft.  Musculoskeletal:        General: No tenderness or deformity. Normal range of motion.     Cervical back: Normal range of motion.  Lymphadenopathy:     Cervical: No cervical adenopathy.  Skin:    General: Skin is warm and dry.     Findings: No erythema or rash.    Neurological:     Mental Status: She is alert and oriented to person, place, and time.  Psychiatric:        Behavior: Behavior normal.        Thought Content: Thought content normal.        Judgment: Judgment normal.      Lab Results  Component Value Date   WBC 3.1 (L) 02/28/2020   HGB 12.6 02/28/2020   HCT 37.0 02/28/2020   MCV 102.8 (H) 02/28/2020   PLT 212 02/28/2020   Lab Results  Component Value Date   FERRITIN 20 12/16/2019   IRON 134 12/16/2019   TIBC 231 (L) 12/16/2019   UIBC 97 (L) 12/16/2019   IRONPCTSAT 58 (H) 12/16/2019   Lab Results  Component Value Date   RETICCTPCT 1.4 06/13/2015   RBC 3.60 (L) 02/28/2020   RETICCTABS 47.6 06/13/2015   No results found for: KPAFRELGTCHN, LAMBDASER, KAPLAMBRATIO No results found for: IGGSERUM, IGA, IGMSERUM No results found for: Odetta Pink, SPEI   Chemistry      Component Value Date/Time   NA 139 02/28/2020 0859   NA 145 08/24/2017 1052   NA 138 01/27/2017 1122   K 4.1 02/28/2020 0859   K 3.9 08/24/2017 1052   K 4.6 01/27/2017 1122   CL 102 02/28/2020 0859   CL 102 08/24/2017 1052   CO2 32 02/28/2020 0859   CO2 31 08/24/2017 1052   CO2 29 01/27/2017 1122   BUN 19 02/28/2020 0859   BUN 16 08/24/2017 1052   BUN 14.6 01/27/2017 1122   CREATININE 0.86 02/28/2020 0859   CREATININE 1.1 08/24/2017 1052   CREATININE 0.8 01/27/2017 1122      Component Value Date/Time   CALCIUM 9.9 02/28/2020 0859   CALCIUM 9.5 08/24/2017 1052   CALCIUM 9.7 01/27/2017 1122   ALKPHOS 60 02/28/2020 0859   ALKPHOS 65 08/24/2017 1052   ALKPHOS 58 01/27/2017 1122   AST 17 02/28/2020 0859   AST 18 01/27/2017 1122   ALT 12 02/28/2020 0859   ALT 18 08/24/2017 1052   ALT 13 01/27/2017 1122   BILITOT 0.4 02/28/2020 0859   BILITOT 0.42 01/27/2017 1122       Impression and Plan: Angela Burgess is a very pleasant 79yo caucasian female with hemochromatosis, homozygous for C282Y  mutation.  It is truly a sad day for [place] in the office.  She has been with this for many years.  She is always been so nice.  It is always been fun talking with her.  She is just a good woman.  She is  a truly inspiring person for [place].  I suspect we probably will phlebotomize her before she goes out to [facility] just to make sure that her iron levels are low or lower and I am not sure when she will see a hematologist in [place] so I want to get her iron on the lower side.   [name], MD 6/8/20219:47 AM

## 2020-02-29 ENCOUNTER — Telehealth: Payer: Self-pay | Admitting: Hematology & Oncology

## 2020-02-29 NOTE — Telephone Encounter (Signed)
No los 6/8 

## 2020-03-02 ENCOUNTER — Other Ambulatory Visit: Payer: Self-pay

## 2020-03-02 ENCOUNTER — Inpatient Hospital Stay: Payer: Medicare Other

## 2020-03-02 DIAGNOSIS — Z7982 Long term (current) use of aspirin: Secondary | ICD-10-CM | POA: Diagnosis not present

## 2020-03-02 DIAGNOSIS — Z79899 Other long term (current) drug therapy: Secondary | ICD-10-CM | POA: Diagnosis not present

## 2020-03-02 MED ORDER — SODIUM CHLORIDE 0.9 % IV SOLN
Freq: Once | INTRAVENOUS | Status: AC
  Filled 2020-03-02: qty 250

## 2020-03-02 NOTE — Patient Instructions (Signed)

## 2020-03-06 ENCOUNTER — Telehealth: Payer: Self-pay | Admitting: *Deleted

## 2020-03-06 NOTE — Telephone Encounter (Signed)
Message received from patient stating that she started with "nausea and exhaustion" on Sunday and wanted to know if any viruses were going around the office since she was in the office on Friday, 03/02/20 for a phlebotomy.  Call placed back to patient and patient notified that no viruses are going around this office that we are aware of.  Pt states that her symptoms may be caused by a bug bite that she received on Saturday night and will possibly go to the ER if she does not start feeling better.

## 2020-03-07 DIAGNOSIS — H608X3 Other otitis externa, bilateral: Secondary | ICD-10-CM | POA: Diagnosis not present

## 2020-03-07 DIAGNOSIS — H9311 Tinnitus, right ear: Secondary | ICD-10-CM | POA: Diagnosis not present

## 2020-03-07 DIAGNOSIS — H6123 Impacted cerumen, bilateral: Secondary | ICD-10-CM | POA: Diagnosis not present

## 2020-03-07 DIAGNOSIS — J309 Allergic rhinitis, unspecified: Secondary | ICD-10-CM | POA: Diagnosis not present

## 2020-04-04 NOTE — Progress Notes (Signed)
Chief Complaint  Patient presents with  . Annual Exam    Doing well  . Hypothyroidism  . Medication Refill    HPI: Angela Burgess 79 y.o. comes in today for Preventive Medicare exam/ and med management .Since last visit. Doing well got light headed after last phlebotomy but no syncope or known hypotension  Thyroid: due for lab  Taking med daily HT: see above no readings reacnetly  Just  Sold house .   Due for  Gyne check  About : Pessary   Mammo and Dexa    Health Maintenance  Topic Date Due  . Hepatitis C Screening  Never done  . TETANUS/TDAP  11/14/2019  . INFLUENZA VACCINE  03/19/2021 (Originally 04/22/2020)  . DEXA SCAN  Completed  . COVID-19 Vaccine  Completed  . PNA vac Low Risk Adult  Completed   Health Maintenance Review LIFESTYLE:  Exercise:   jsut sold  House  Tai chi one day per week  Tobacco/ETS: n Alcohol: n Sugar beverages: Sleep: 9 hours  Drug use: no HH:1  To move to [place]     Hearing: ok  Vision:  No limitations at present . Last eye check UTD  Safety:  Has smoke detector and wears seat belts.  No firearms. No excess sun exposure. Sees dentist regularly.  Falls: n  Memory: Felt to be good  , no concern from her or her family.  Depression: No anhedonia unusual crying or depressive symptoms  Nutrition: Eats well balanced diet; adequate calcium and vitamin D. No swallowing chewing problems.  Injury: no major injuries in the last six months.  Other healthcare providers:  Reviewed today .  Preventive parameters:   Reviewed   ADLS:   There are no problems or need for assistance  driving, feeding, obtaining food, dressing, toileting and bathing, managing money using phone. She is independent.   ROS:  GEN/ HEENT: No fever, significant weight changes sweats headaches vision problems hearing changes, CV/ PULM; No chest pain shortness of breath cough, syncope,edema  change in exercise tolerance. GI /GU: No adominal pain, vomiting, change in  bowel habits. No blood in the stool. No significant GU symptoms. SKIN/HEME: ,no acute skin rashes suspicious lesions or bleeding. No lymphadenopathy, nodules, masses.  NEURO/ PSYCH:  No neurologic signs such as weakness numbness. No depression anxiety. IMM/ Allergy: No unusual infections.  Allergy .   REST of 12 system review negative except as per HPI   Past Medical History:  Diagnosis Date  . Anemia   . Arthritis   . Cervical cancer ([facility]) 1968  . Dexamethasone adverse reaction 2008  . Elevated MCV    for years  . Eye exam abnormal 6/09  . Family history of malignant neoplasm of gastrointestinal tract   . Gastritis   . History of mammogram 1/10  . History of normal resting EKG 2005  . Hyperlipidemia   . Hypothyroidism   . IBS (irritable bowel syndrome)   . Macrocytosis    with theme eval in past and normal b12  . Osteopenia   . Rectocele   . Rosacea   . Tetanus-diphtheria (Td) vaccination 1999    Family History  Problem Relation Age of Onset  . Cirrhosis Mother   . Kidney disease Mother   . Allergy (severe) Mother        protein allergy that sent posion to her brain  . Leukemia Father   . Heart attack Father 80  . Diabetes Father   . Bipolar  disorder Sister   . Stroke Sister   . Lung cancer Brother        smoker  . Colon cancer Brother   . Bipolar disorder Son   . Seizures Sister   . Seizures Sister   . Breast cancer Maternal Aunt   . Breast cancer Cousin   . Lung cancer Cousin   . Healthy Daughter   . Seizures Brother     Social History   Socioeconomic History  . Marital status: Married    Spouse name: Not on file  . Number of children: 2  . Years of education: Not on file  . Highest education level: Not on file  Occupational History  . Occupation: retired  Tobacco Use  . Smoking status: Never Smoker  . Smokeless tobacco: Never Used  . Tobacco comment: never used tobacco  Vaping Use  . Vaping Use: Never used  Substance and Sexual Activity  .  Alcohol use: Not Currently    Alcohol/week: 0.0 standard drinks    Comment: none now   . Drug use: Never  . Sexual activity: Not on file  Other Topics Concern  . 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 Difficulty of Paying Living Expenses:   Food Insecurity:   . Worried About Charity fundraiser in the Last Year:   . Arboriculturist in the Last Year:   Transportation Needs:   . Film/video editor (Medical):   [name] Lack of Transportation (Non-Medical):   Physical Activity:   . Days of Exercise per Week:   . Minutes of Exercise per Session:   Stress:   . Feeling of Stress :   Social Connections:   . Frequency of Communication with Friends and Family:   . Frequency of Social Gatherings with Friends and Family:   . Attends Religious Services:   . Active Member of Clubs or Organizations:   . Attends Archivist Meetings:   [name] Marital Status:     Outpatient Encounter Medications as of 04/06/2020  Medication Sig  . amLODipine (NORVASC) 2.5 MG tablet TAKE 1 TABLET(2.5 MG) BY MOUTH DAILY  . aspirin 81 MG tablet Take 81 mg by mouth daily.  . Astaxanthin 4 MG CAPS Take 1 capsule by mouth 2 (two) times daily.  . cetirizine (ZYRTEC) 10 MG tablet Take by mouth.  . Cholecalciferol (VITAMIN D3) 2000 UNITS TABS Take by mouth every morning.  . diclofenac sodium (VOLTAREN) 1 % GEL 3 grams to 3 large joints up to three times a day  . estradiol (ESTRACE) 0.1 MG/GM vaginal cream Place 1 Applicatorful vaginally 2 (two) times a week.   . Fluocinolone Acetonide 0.01 % OIL SMARTSIG:5 Drop(s) In Ear(s) Every 2 Weeks  . levocetirizine (XYZAL) 5 MG tablet Take 5 mg by mouth daily.  [name] levothyroxine (SYNTHROID) 112 MCG tablet TAKE 1 TABLET BY MOUTH EVERY DAY  . metroNIDAZOLE (METROCREAM) 0.75 % cream Apply 1 application topically daily.    .  Multiple Vitamin (MULTIVITAMIN) tablet Take 1 tablet by mouth daily.    . Calcium Carbonate-Vitamin D (CALCIUM 500/D) 500-125 MG-UNIT TABS Take by mouth.   (Patient not taking: Reported on 04/06/2020)   No facility-administered encounter medications on file as of 04/06/2020.    EXAM:  BP 118/64  Pulse 69   Temp 97.8 F (36.6 C) (Oral)   Ht 5' 5" (1.651 m)   Wt 115 lb 9.6 oz (52.4 kg)   SpO2 97%   BMI 19.24 kg/m   Body mass index is 19.24 kg/m.  Physical Exam: Vital signs reviewed TJQ:ZESP is a well-developed well-nourished alert cooperative   who appears stated age in no acute distress.  HEENT: normocephalic atraumatic , Eyes: PERRL EOM's full, conjunctiva clear, Nares: paten,t no deformity discharge or tenderness., Ears: no deformity EAC's clear TMs with normal landmarks. Mouth: masked NECK: supple without masses, thyromegaly or bruits. CHEST/PULM:  Clear to auscultation and percussion breath sounds equal no wheeze , rales or rhonchi. No chest wall deformities or tenderness. CV: PMI is nondisplaced, S1 S2 no gallops, murmurs, rubs. Peripheral pulses are full without delay.No JVD .  ABDOMEN: Bowel sounds normal nontender  No guard or rebound, no hepato splenomegal no CVA tenderness.   Extremtities:  No clubbing cyanosis or edema, no acute joint swelling or redness no focal atrophy NEURO:  Oriented x3, cranial nerves 3-12 appear to be intact, no obvious focal weakness,gait within normal limits no abnormal reflexes or asymmetrical SKIN: No acute rashes normal turgor, color, no bruising or petechiae. PSYCH: Oriented, good eye contact, no obvious depression anxiety, cognition and judgment appear normal. LN: no cervical axillary inguinal adenopathy No noted deficits in memory, attention, and speech.   Lab Results  Component Value Date   WBC 3.1 (L) 02/28/2020   HGB 12.6 02/28/2020   HCT 37.0 02/28/2020   PLT 212 02/28/2020   GLUCOSE 111 (H) 02/28/2020   CHOL 223 (H) 10/29/2018    TRIG 61.0 10/29/2018   HDL 63.10 10/29/2018   LDLDIRECT 165.2 07/21/2012   LDLCALC 147 (H) 10/29/2018   ALT 12 02/28/2020   AST 17 02/28/2020   NA 139 02/28/2020   K 4.1 02/28/2020   CL 102 02/28/2020   CREATININE 0.86 02/28/2020   BUN 19 02/28/2020   CO2 32 02/28/2020   TSH 1.17 10/29/2018   HGBA1C 5.1 09/18/2015    ASSESSMENT AND PLAN:  Discussed the following assessment and plan:  Visit for preventive health examination  Medication management - Plan: Lipid panel, TSH, Basic metabolic panel, Basic metabolic panel, TSH, Lipid panel  Essential hypertension - Plan: Lipid panel, TSH, Basic metabolic panel, Basic metabolic panel, TSH, Lipid panel  Hypothyroidism, unspecified type - Plan: Lipid panel, TSH, Basic metabolic panel, Basic metabolic panel, TSH, Lipid panel  Hereditary hemochromatosis ([place]) Update labs thyroid and lipid and bmp monitor bp for low readings and consider dc med or adjusting plan  discs transition of care   colo guard for now uncertain other risk  Patient Care Team: Burnis Medin, MD as PCP - General Olevia Perches Lowella Bandy, MD (Inactive) (Gastroenterology) Everlene Farrier, MD as Attending Physician (Obstetrics and Gynecology) Bo Merino, MD (Rheumatology) inman   savage Marin Olp Rudell Cobb, MD as Consulting Physician (Oncology)  Patient Instructions  will order cologuard  get dex and Mammo at your gyne._0  Check bp readings if too low and lightheaded may need to stop  The amlodipine. Will let  You know about the thyroid level .   Ok to send [place] essages if we can help in your transition to a new medical home   Enjoy  Your family.      Preventive Care 28 Years and Older, Female Preventive care refers to lifestyle choices and visits with your health care provider that can promote health and wellness.  This includes:  A yearly physical exam. This is also called an annual well check.  Regular dental and eye exams.  Immunizations.  Screening  for certain conditions.  Healthy lifestyle choices, such as diet and exercise. What can I expect for my preventive care visit? Physical exam Your health care provider will check:  Height and weight. These may be used to calculate body mass index (BMI), which is a measurement that tells if you are at a healthy weight.  Heart rate and blood pressure.  Your skin for abnormal spots. Counseling Your health care provider may ask you questions about:  Alcohol, tobacco, and drug use.  Emotional well-being.  Home and relationship well-being.  Sexual activity.  Eating habits.  History of falls.  Memory and ability to understand (cognition).  Work and work Statistician.  Pregnancy and menstrual history. What immunizations do I need?  Influenza (flu) vaccine  This is recommended every year. Tetanus, diphtheria, and pertussis (Tdap) vaccine  You may need a Td booster every 10 years. Varicella (chickenpox) vaccine  You may need this vaccine if you have not already been vaccinated. Zoster (shingles) vaccine  You may need this after age 73. Pneumococcal conjugate (PCV13) vaccine  One dose is recommended after age 60. Pneumococcal polysaccharide (PPSV23) vaccine  One dose is recommended after age 85. Measles, mumps, and rubella (MMR) vaccine  You may need at least one dose of MMR if you were born in 1957 or later. You may also need a second dose. Meningococcal conjugate (MenACWY) vaccine  You may need this if you have certain conditions. Hepatitis A vaccine  You may need this if you have certain conditions or if you travel or work in places where you may be exposed to hepatitis A. Hepatitis B vaccine  You may need this if you have certain conditions or if you travel or work in places where you may be exposed to hepatitis B. Haemophilus influenzae type b (Hib) vaccine  You may need this if you have certain conditions. You may receive vaccines as individual doses or as  more than one vaccine together in one shot (combination vaccines). Talk with your health care provider about the risks and benefits of combination vaccines. What tests do I need? Blood tests  Lipid and cholesterol levels. These may be checked every 5 years, or more frequently depending on your overall health.  Hepatitis C test.  Hepatitis B test. Screening  Lung cancer screening. You may have this screening every year starting at age 35 if you have a 30-pack-year history of smoking and currently smoke or have quit within the past 15 years.  Colorectal cancer screening. All adults should have this screening starting at age 52 and continuing until age 17. Your health care provider may recommend screening at age 14 if you are at increased risk. You will have tests every 1-10 years, depending on your results and the type of screening test.  Diabetes screening. This is done by checking your blood sugar (glucose) after you have not eaten for a while (fasting). You may have this done every 1-3 years.  Mammogram. This may be done every 1-2 years. Talk with your health care provider about how often you should have regular mammograms.  BRCA-related cancer screening. This may be done if you have a family history of breast, ovarian, tubal, or peritoneal cancers. Other tests  Sexually transmitted disease (STD) testing.  Bone density scan. This is done to screen for osteoporosis. You may have this done starting  at age 41. Follow these instructions at home: Eating and drinking  Eat a diet that includes fresh fruits and vegetables, whole grains, lean protein, and low-fat dairy products. Limit your intake of foods with high amounts of sugar, saturated fats, and salt.  Take vitamin and mineral supplements as recommended by your health care provider.  Do not drink alcohol if your health care provider tells you not to drink.  If you drink alcohol: ? Limit how much you have to 0-1 drink a day. ? Be  aware of how much alcohol is in your drink. In the [place] one drink equals one 12 oz bottle of beer (355 mL), one 5 oz glass of wine (148 mL), or one 1 oz glass of hard liquor (44 mL). Lifestyle  Take daily care of your teeth and gums.  Stay active. Exercise for at least 30 minutes on 5 or more days each week.  Do not use any products that contain nicotine or tobacco, such as cigarettes, e-cigarettes, and chewing tobacco. If you need help quitting, ask your health care provider.  If you are sexually active, practice safe sex. Use a condom or other form of protection in order to prevent STIs (sexually transmitted infections).  Talk with your health care provider about taking a low-dose aspirin or statin. What's next?  Go to your health care provider once a year for a well check visit.  Ask your health care provider how often you should have your eyes and teeth checked.  Stay up to date on all vaccines. This information is not intended to replace advice given to you by your health care provider. Make sure you discuss any questions you have with your health care provider. Document Revised: 09/02/2018 Document Reviewed: 09/02/2018 Elsevier Patient Education  2020 Nilwood Panosh M.D.

## 2020-04-06 ENCOUNTER — Ambulatory Visit (INDEPENDENT_AMBULATORY_CARE_PROVIDER_SITE_OTHER): Payer: Medicare Other | Admitting: Internal Medicine

## 2020-04-06 ENCOUNTER — Other Ambulatory Visit: Payer: Self-pay

## 2020-04-06 ENCOUNTER — Encounter: Payer: Self-pay | Admitting: Internal Medicine

## 2020-04-06 ENCOUNTER — Telehealth: Payer: Self-pay

## 2020-04-06 VITALS — BP 118/64 | HR 69 | Temp 97.8°F | Ht 65.0 in | Wt 115.6 lb

## 2020-04-06 DIAGNOSIS — Z0000 Encounter for general adult medical examination without abnormal findings: Secondary | ICD-10-CM

## 2020-04-06 DIAGNOSIS — I10 Essential (primary) hypertension: Secondary | ICD-10-CM

## 2020-04-06 DIAGNOSIS — E039 Hypothyroidism, unspecified: Secondary | ICD-10-CM

## 2020-04-06 DIAGNOSIS — Z79899 Other long term (current) drug therapy: Secondary | ICD-10-CM | POA: Diagnosis not present

## 2020-04-06 NOTE — Patient Instructions (Signed)
will order cologuard  get dex and Mammo at your gyne.\  Check bp readings if too low and lightheaded may need to stop  The amlodipine. Will let  You know about the thyroid level .   Ok to send [place] essages if we can help in your transition to a new medical home   Enjoy  Your family.      Preventive Care 18 Years and Older, Female Preventive care refers to lifestyle choices and visits with your health care provider that can promote health and wellness. This includes:  A yearly physical exam. This is also called an annual well check.  Regular dental and eye exams.  Immunizations.  Screening for certain conditions.  Healthy lifestyle choices, such as diet and exercise. What can I expect for my preventive care visit? Physical exam Your health care provider will check:  Height and weight. These may be used to calculate body mass index (BMI), which is a measurement that tells if you are at a healthy weight.  Heart rate and blood pressure.  Your skin for abnormal spots. Counseling Your health care provider may ask you questions about:  Alcohol, tobacco, and drug use.  Emotional well-being.  Home and relationship well-being.  Sexual activity.  Eating habits.  History of falls.  Memory and ability to understand (cognition).  Work and work Statistician.  Pregnancy and menstrual history. What immunizations do I need?  Influenza (flu) vaccine  This is recommended every year. Tetanus, diphtheria, and pertussis (Tdap) vaccine  You may need a Td booster every 10 years. Varicella (chickenpox) vaccine  You may need this vaccine if you have not already been vaccinated. Zoster (shingles) vaccine  You may need this after age 28. Pneumococcal conjugate (PCV13) vaccine  One dose is recommended after age 81. Pneumococcal polysaccharide (PPSV23) vaccine  One dose is recommended after age 42. Measles, mumps, and rubella (MMR) vaccine  You may need at least one dose of  MMR if you were born in 1957 or later. You may also need a second dose. Meningococcal conjugate (MenACWY) vaccine  You may need this if you have certain conditions. Hepatitis A vaccine  You may need this if you have certain conditions or if you travel or work in places where you may be exposed to hepatitis A. Hepatitis B vaccine  You may need this if you have certain conditions or if you travel or work in places where you may be exposed to hepatitis B. Haemophilus influenzae type b (Hib) vaccine  You may need this if you have certain conditions. You may receive vaccines as individual doses or as more than one vaccine together in one shot (combination vaccines). Talk with your health care provider about the risks and benefits of combination vaccines. What tests do I need? Blood tests  Lipid and cholesterol levels. These may be checked every 5 years, or more frequently depending on your overall health.  Hepatitis C test.  Hepatitis B test. Screening  Lung cancer screening. You may have this screening every year starting at age 77 if you have a 30-pack-year history of smoking and currently smoke or have quit within the past 15 years.  Colorectal cancer screening. All adults should have this screening starting at age 71 and continuing until age 58. Your health care provider may recommend screening at age 75 if you are at increased risk. You will have tests every 1-10 years, depending on your results and the type of screening test.  Diabetes screening. This is done  by checking your blood sugar (glucose) after you have not eaten for a while (fasting). You may have this done every 1-3 years.  Mammogram. This may be done every 1-2 years. Talk with your health care provider about how often you should have regular mammograms.  BRCA-related cancer screening. This may be done if you have a family history of breast, ovarian, tubal, or peritoneal cancers. Other tests  Sexually transmitted  disease (STD) testing.  Bone density scan. This is done to screen for osteoporosis. You may have this done starting at age 19. Follow these instructions at home: Eating and drinking  Eat a diet that includes fresh fruits and vegetables, whole grains, lean protein, and low-fat dairy products. Limit your intake of foods with high amounts of sugar, saturated fats, and salt.  Take vitamin and mineral supplements as recommended by your health care provider.  Do not drink alcohol if your health care provider tells you not to drink.  If you drink alcohol: ? Limit how much you have to 0-1 drink a day. ? Be aware of how much alcohol is in your drink. In the [place] one drink equals one 12 oz bottle of beer (355 mL), one 5 oz glass of wine (148 mL), or one 1 oz glass of hard liquor (44 mL). Lifestyle  Take daily care of your teeth and gums.  Stay active. Exercise for at least 30 minutes on 5 or more days each week.  Do not use any products that contain nicotine or tobacco, such as cigarettes, e-cigarettes, and chewing tobacco. If you need help quitting, ask your health care provider.  If you are sexually active, practice safe sex. Use a condom or other form of protection in order to prevent STIs (sexually transmitted infections).  Talk with your health care provider about taking a low-dose aspirin or statin. What's next?  Go to your health care provider once a year for a well check visit.  Ask your health care provider how often you should have your eyes and teeth checked.  Stay up to date on all vaccines. This information is not intended to replace advice given to you by your health care provider. Make sure you discuss any questions you have with your health care provider. Document Revised: 09/02/2018 Document Reviewed: 09/02/2018 Elsevier Patient Education  2020 [employer].

## 2020-04-06 NOTE — Telephone Encounter (Signed)
Cologuard request faxed to [employer] at [phone] and received confirmation fax that it went through.

## 2020-04-07 LAB — BASIC METABOLIC PANEL
BUN: 20 mg/dL (ref 7–25)
CO2: 29 mmol/L (ref 20–32)
Calcium: 9.8 mg/dL (ref 8.6–10.4)
Chloride: 103 mmol/L (ref 98–110)
Creat: 0.82 mg/dL (ref 0.60–0.93)
Glucose, Bld: 91 mg/dL (ref 65–99)
Potassium: 4.5 mmol/L (ref 3.5–5.3)
Sodium: 140 mmol/L (ref 135–146)

## 2020-04-07 LAB — LIPID PANEL
Cholesterol: 208 mg/dL — ABNORMAL HIGH (ref <200)
HDL: 71 mg/dL (ref >=50)
LDL Cholesterol (Calc): 121 mg/dL (calc) — ABNORMAL HIGH
Non-HDL Cholesterol (Calc): 137 mg/dL (calc) — ABNORMAL HIGH (ref <130)
Total CHOL/HDL Ratio: 2.9 (calc) (ref <5.0)
Triglycerides: 67 mg/dL (ref <150)

## 2020-04-07 LAB — TSH: TSH: 0.09 mIU/L — ABNORMAL LOW (ref 0.40–4.50)

## 2020-04-11 NOTE — Progress Notes (Signed)
Tsh a bit  ( low)  showing possible too high a dose of thyroid medication    dont know why unless  a different brand generic or because a different lab ?   Have there been any changes in  Angela Burgess thyroid pills?   If not  we may need to adjust Angela Burgess dose of medication or repeat the level to make sure of result . Since levels have been stable for  years.  Cholesterol  slightly better than in past   Repeat tsh  with free t4 at Angela Burgess convenience  ( dont have to fast)

## 2020-04-12 NOTE — Progress Notes (Signed)
Lets have her take medication  6 days a week in  the interim   pick a day off and change the sig   Then check tsh before leaving town  to see where we are

## 2020-04-13 ENCOUNTER — Other Ambulatory Visit: Payer: Self-pay

## 2020-04-13 DIAGNOSIS — R7989 Other specified abnormal findings of blood chemistry: Secondary | ICD-10-CM

## 2020-04-18 DIAGNOSIS — Z1211 Encounter for screening for malignant neoplasm of colon: Secondary | ICD-10-CM | POA: Diagnosis not present

## 2020-04-18 DIAGNOSIS — Z1212 Encounter for screening for malignant neoplasm of rectum: Secondary | ICD-10-CM | POA: Diagnosis not present

## 2020-04-24 ENCOUNTER — Other Ambulatory Visit: Payer: Self-pay | Admitting: Internal Medicine

## 2020-04-25 ENCOUNTER — Other Ambulatory Visit: Payer: Self-pay

## 2020-04-25 ENCOUNTER — Other Ambulatory Visit: Payer: Medicare Other

## 2020-04-25 DIAGNOSIS — R7989 Other specified abnormal findings of blood chemistry: Secondary | ICD-10-CM | POA: Diagnosis not present

## 2020-04-25 DIAGNOSIS — E039 Hypothyroidism, unspecified: Secondary | ICD-10-CM | POA: Diagnosis not present

## 2020-04-25 DIAGNOSIS — E782 Mixed hyperlipidemia: Secondary | ICD-10-CM | POA: Diagnosis not present

## 2020-04-25 LAB — TSH: TSH: 0.47 mIU/L (ref 0.40–4.50)

## 2020-04-25 LAB — T4, FREE: Free T4: 1.5 ng/dL (ref 0.8–1.8)

## 2020-04-25 LAB — COLOGUARD: Cologuard: NEGATIVE

## 2020-04-29 NOTE — Progress Notes (Signed)
Thyroid tests are now in range .  Stay on same dosage  and confirm on med list

## 2020-05-02 NOTE — Progress Notes (Signed)
Office Visit Note  Patient: Angela Burgess             Date of Birth: 29-Feb-1941           MRN: [id number]             PCP: Burnis Medin, MD Referring: Burnis Medin, MD Visit Date: 05/16/2020 Occupation: @GUAROCC @  Subjective:  Pain in both hands    History of Present Illness: Angela Burgess is a 79 y.o. female with history of hereditary hemochromatosis and osteoarthritis.  Patient reports that she will be moving to [facility] at the end of the week.  She states that she has been busy packing and moving boxes, so she states she has been experiencing increased pain in multiple joints including both hands and to her lower back.  She has ongoing pain in her right hip.  She has been taking aspirin 81 mg 1 tablet daily as needed for pain relief. She states that she is due for her bone density to be updated but was unable to schedule appointment with her gynecologist prior to moving.  She plans on having an updated DEXA once she moves to [place].  She denies any recent falls or fractures.  She is taking vitamin D 2000 units daily.  She will be establishing care with a new PCP and oncologist.     Activities of Daily Living:  Patient reports morning stiffness for a few  minutes.   Patient Reports nocturnal pain.  Difficulty dressing/grooming: Denies Difficulty climbing stairs: Denies Difficulty getting out of chair: Denies Difficulty using hands for taps, buttons, cutlery, and/or writing: Reports  Review of Systems  Constitutional: Negative for fatigue.  HENT: Negative for mouth sores, mouth dryness and nose dryness.   Eyes: Positive for dryness.  Respiratory: Negative for shortness of breath and difficulty breathing.   Cardiovascular: Negative for chest pain and palpitations.  Gastrointestinal: Negative for blood in stool, constipation and diarrhea.  Endocrine: Positive for increased urination.  Genitourinary: Positive for difficulty urinating.  Musculoskeletal: Positive for  arthralgias, joint pain and morning stiffness. Negative for joint swelling, myalgias, muscle tenderness and myalgias.  Skin: Negative for color change, rash and redness.  Allergic/Immunologic: Negative for susceptible to infections.  Neurological: Negative for dizziness, numbness, headaches, memory loss and weakness.  Hematological: Negative for bruising/bleeding tendency.  Psychiatric/Behavioral: Negative for confusion.    PMFS History:  Patient Active Problem List   Diagnosis Date Noted  . Primary osteoarthritis of both hands 12/20/2016  . Primary osteoarthritis of both knees 12/20/2016  . Primary osteoarthritis of both feet 12/20/2016  . Hypothyroidism 09/05/2014  . Hyperlipidemia 09/05/2014  . Medicare annual wellness visit, subsequent 09/05/2014  . Lumbar spondylosis 07/07/2014  . 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 Visit for preventive health examination 08/31/2013  . Hair loss 01/20/2013  . Elevated MCV 07/22/2012  . Preventative health care 04/02/2011  . IRRITABLE BOWEL SYNDROME 07/10/2010  . GASTRITIS 07/04/2010  . RHINITIS 05/21/2010  . VERTIGO 05/21/2010  . VARICOSE VEINS, LOWER EXTREMITIES 11/13/2009  . OTHER DISEASES OF NASAL CAVITY AND SINUSES 01/31/2008  . HYPOTHYROIDISM 08/31/2007  . HYPERLIPIDEMIA 08/31/2007  . OTHER SPEC DISEASES BLOOD&BLOOD-FORMING ORGANS 08/31/2007  . ROSACEA 08/31/2007  . Osteoarthritis of right hip 08/31/2007  . OSTEOPENIA 08/31/2007    Past Medical History:  Diagnosis Date  . Anemia   . Arthritis   . Cervical cancer ([place]) 1968  . Dexamethasone adverse reaction 2008  . Elevated MCV    for years  .  Eye exam abnormal 6/09  . Family history of malignant neoplasm of gastrointestinal tract   . Gastritis   . History of mammogram 1/10  . History of normal resting EKG 2005  . Hyperlipidemia   . Hypothyroidism   . IBS (irritable bowel syndrome)   . Macrocytosis    with theme eval in past and normal b12  . Osteopenia   . Rectocele   .  Rosacea   . Tetanus-diphtheria (Td) vaccination 1999    Family History  Problem Relation Age of Onset  . Cirrhosis Mother   . Kidney disease Mother   . Allergy (severe) Mother        protein allergy that sent posion to her brain  . Leukemia Father   . Heart attack Father 13  . Diabetes Father   . Bipolar disorder Sister   . Stroke Sister   . Lung cancer Brother        smoker  . Colon cancer Brother   . Bipolar disorder Son   . Seizures Sister   . Seizures Sister   . Breast cancer Maternal Aunt   . Breast cancer Cousin   . Lung cancer Cousin   . Healthy Daughter   . Seizures Brother    Past Surgical History:  Procedure Laterality Date  . ABDOMINAL HYSTERECTOMY    . APPENDECTOMY    . BREAST EXCISIONAL BIOPSY Right    Lump Removed   . 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 Influenza Split 06/22/2013  . Influenza Whole 07/07/2007, 07/05/2008, 07/23/2009, 05/21/2010  . Influenza, High Dose Seasonal PF 08/03/2017, 08/29/2018  . Influenza,inj,Quad PF,6+ Mos 07/24/2016  . Influenza-Unspecified 07/06/2014  . PFIZER SARS-COV-2 Vaccination 11/01/2019, 11/26/2019  . Pneumococcal Conjugate-13 12/21/2013  . Pneumococcal Polysaccharide-23 01/31/2008  . Td 09/22/1997, 11/13/2009  . Zoster 05/21/2010     Objective: Vital Signs: BP (!) 160/74 (BP Location: Left Arm, Patient Position: Sitting, Cuff Size: Normal)   Pulse 66   Resp 14   Ht 5\' 4"  (1.626 m)   Wt 118 lb 9.6 oz (53.8 kg)   BMI 20.36 kg/m    Physical Exam Vitals and nursing note reviewed.  Constitutional:      Appearance: She is well-developed.  HENT:     Head: Normocephalic and atraumatic.  Eyes:     Conjunctiva/sclera: Conjunctivae normal.  Pulmonary:     Effort:  Pulmonary effort is normal.  Abdominal:     Palpations: Abdomen is soft.  Musculoskeletal:     Cervical back: Normal range of motion.  Skin:    General: Skin is warm and dry.     Capillary Refill: Capillary refill takes less than 2 seconds.  Neurological:     Mental Status: She is alert and oriented to person, place, and time.  Psychiatric:        Behavior: Behavior normal.      Musculoskeletal Exam: C-spine, thoracic spine, lumbar spine have good range of motion.  Midline spinal tenderness in the lumbar region.  Shoulder joints, elbow joints, wrist joints, MCPs, PIPs, DIPs have good range of motion with no synovitis.  She has synovial thickening of the right second and third MCP joints and third PIP joint.  She has complete fist formation bilaterally.  She has limited range of motion of both hip joints on exam with some discomfort in the right hip.  She has limited extension of the right knee joint but no warmth or effusion was noted.  Ankle joints have good range of motion with no tenderness or inflammation.  CDAI Exam: CDAI Score: -- Patient Global: --; Provider Global: -- Swollen: --; Tender: -- Joint Exam 05/16/2020   No joint exam has been documented for this visit   There is currently no information documented on the homunculus. Go to the Rheumatology activity and complete the homunculus joint exam.  Investigation: No additional findings.  Imaging: No results found.  Recent Labs: Lab Results  Component Value Date   WBC 3.1 (L) 02/28/2020   HGB 12.6 02/28/2020   PLT 212 02/28/2020   NA 140 04/06/2020   K 4.5 04/06/2020   CL 103 04/06/2020   CO2 29 04/06/2020   GLUCOSE 91 04/06/2020   BUN 20 04/06/2020   CREATININE 0.82 04/06/2020   BILITOT 0.4 02/28/2020   ALKPHOS 60 02/28/2020   AST 17 02/28/2020   ALT 12 02/28/2020   PROT 6.9 02/28/2020   ALBUMIN 4.3 02/28/2020   CALCIUM 9.8 04/06/2020   GFRAA >60 02/28/2020    Speciality Comments: No specialty comments  available.  Procedures:  No procedures performed Allergies: Tetracycline and Fluocinonide   Assessment / Plan:     Visit Diagnoses: Hereditary hemochromatosis ([place]) - She continues to follow-up with her hematologist, Dr. Marin Olp, every 3 months and has a phlebotomy every 6 months.  According to the patient she has been clinically stable without any complications.  She is not experiencing any shortness of breath, chest pain, or palpitations at this time.  She continues to have synovial thickening of the right second and third MCP joints but inflammation was noted.  She is not experiencing any new or worsening symptoms.  She will be moving to [facility] the end of this week and will be establishing care with a new hematologist and rheumatologist.  Primary osteoarthritis of both hands: She has PIP and DIP thickening consistent with osteoarthritis of both hands.  Synovial thickening of the right second and third MCP joints.  She has complete fist formation bilaterally.  She is been experiencing increased discomfort in both hands since she has been packing and preparing to move.  She has been taking aspirin 81 mg 1 tablet daily as needed for pain relief.  She was given a prescription for [facility] joint braces.  She was also given a handout of hand exercises to perform.  We discussed the use of arthritis gloves for compression and comfort.  Joint protection and muscle strengthening were discussed.  Primary osteoarthritis of both knees: She has limited extension of the right knee joint on exam.  No warmth or effusion was noted.  The left knee has good range of motion with no discomfort at this time.  Primary osteoarthritis of both feet - Bilateral bunions noted on the 1st MTPs: She is not experiencing any discomfort in her feet at this time.  She wears proper fitting shoes.  Primary osteoarthritis of right hip: She has limited range of motion of the right hip joint with some discomfort on exam.  DDD (degenerative  disc disease), lumbar: She has been experiencing increased discomfort in her lower back which she attributes to packing and moving boxes in preparation to move.  We discussed the importance of performing back exercises and core strengthening  exercises.  A handout of these exercises were provided to the patient.  She is not experiencing any symptoms of radiculopathy at this time.  Osteopenia of multiple sites - DEXA previously ordered by gynecologist.  She is due to update her DEXA but she plans on having it performed in [place].  She continues to take a vitamin D supplement 2000 units daily.  She has not had any recent falls or fractures.  She does have postural thoracic kyphosis but no midline spinal tenderness.  Postural kyphosis of thoracic region: Chronic, unchanged.  No midline spinal tenderness.    Other medical conditions are listed as follows:   History of hypothyroidism  Rosacea  History of hyperlipidemia  History of IBS  Orders: No orders of the defined types were placed in this encounter.  No orders of the defined types were placed in this encounter.     Follow-Up Instructions: Return if symptoms worsen or fail to improve, for Hemochromatosis , Osteoarthritis, DDD.   Ofilia Neas, PA-C  Note - This record has been created using Dragon software.  Chart creation errors have been sought, but may not always  have been located. Such creation errors do not reflect on  the standard of medical care.

## 2020-05-16 ENCOUNTER — Other Ambulatory Visit: Payer: Self-pay

## 2020-05-16 ENCOUNTER — Ambulatory Visit: Payer: Medicare Other | Admitting: Physician Assistant

## 2020-05-16 ENCOUNTER — Encounter: Payer: Self-pay | Admitting: Physician Assistant

## 2020-05-16 DIAGNOSIS — M170 Bilateral primary osteoarthritis of knee: Secondary | ICD-10-CM

## 2020-05-16 DIAGNOSIS — L719 Rosacea, unspecified: Secondary | ICD-10-CM

## 2020-05-16 DIAGNOSIS — M19041 Primary osteoarthritis, right hand: Secondary | ICD-10-CM

## 2020-05-16 DIAGNOSIS — M19071 Primary osteoarthritis, right ankle and foot: Secondary | ICD-10-CM

## 2020-05-16 DIAGNOSIS — M19072 Primary osteoarthritis, left ankle and foot: Secondary | ICD-10-CM

## 2020-05-16 DIAGNOSIS — M4004 Postural kyphosis, thoracic region: Secondary | ICD-10-CM

## 2020-05-16 DIAGNOSIS — M8589 Other specified disorders of bone density and structure, multiple sites: Secondary | ICD-10-CM

## 2020-05-16 DIAGNOSIS — M5136 Other intervertebral disc degeneration, lumbar region: Secondary | ICD-10-CM

## 2020-05-16 DIAGNOSIS — M1611 Unilateral primary osteoarthritis, right hip: Secondary | ICD-10-CM | POA: Diagnosis not present

## 2020-05-16 DIAGNOSIS — Z8639 Personal history of other endocrine, nutritional and metabolic disease: Secondary | ICD-10-CM

## 2020-05-16 DIAGNOSIS — Z8719 Personal history of other diseases of the digestive system: Secondary | ICD-10-CM

## 2020-05-16 DIAGNOSIS — M19042 Primary osteoarthritis, left hand: Secondary | ICD-10-CM

## 2020-05-16 NOTE — Patient Instructions (Addendum)
COVID-19 vaccine recommendations:   COVID-19 vaccine is recommended for everyone (unless you are allergic to a vaccine component), even if you are on a medication that suppresses your immune system.   If you are on Methotrexate, Cellcept (mycophenolate), Rinvoq, Morrie Sheldon, and Olumiant- hold the medication for 1 week after each vaccine. Hold Methotrexate for 2 weeks after the single dose COVID-19 vaccine.   If you are on Orencia subcutaneous injection - hold medication one week prior to and one week after the first COVID-19 vaccine dose (only).   If you are on Orencia IV infusions- time vaccination administration so that the first COVID-19 vaccination will occur four weeks after the infusion and postpone the subsequent infusion by one week.   If you are on Cyclophosphamide or Rituxan infusions please contact your doctor prior to receiving the COVID-19 vaccine.   Do not take Tylenol or any anti-inflammatory medications (NSAIDs) 24 hours prior to the COVID-19 vaccination.   There is no direct evidence about the efficacy of the COVID-19 vaccine in individuals who are on medications that suppress the immune system.   Even if you are fully vaccinated, and you are on any medications that suppress your immune system, please continue to wear a mask, maintain at least six feet social distance and practice hand hygiene.   If you develop a COVID-19 infection, please contact your PCP or our office to determine if you need antibody infusion.  The booster vaccine is now available for immunocompromised patients. It is advised that if you had Pfizer vaccine you should get Coca-[employer]a booster.  If you had a Moderna vaccine then you should get a Moderna booster. Johnson and Wynetta Emery does not have a booster vaccine at this time.  Please see the following web sites for updated information.    https://www.[facility].org/Portals/0/Files/COVID-19-Vaccination-Patient-Resources.pdf  https://www.[facility]heumatology.org/About-Us/Newsroom/Press-Releases/ID/1159    Hand Exercises Hand exercises can be helpful for almost anyone. These exercises can strengthen the hands, improve flexibility and movement, and increase blood flow to the hands. These results can make work and daily tasks easier. Hand exercises can be especially helpful for people who have joint pain from arthritis or have nerve damage from overuse (carpal tunnel syndrome). These exercises can also help people who have injured a hand. Exercises Most of these hand exercises are gentle stretching and motion exercises. It is usually safe to do them often throughout the day. Warming up your hands before exercise may help to reduce stiffness. You can do this with gentle massage or by placing your hands in warm water for 10-15 minutes. It is normal to feel some stretching, pulling, tightness, or mild discomfort as you begin new exercises. This will gradually improve. Stop an exercise right away if you feel sudden, severe pain or your pain gets worse. Ask your health care provider which exercises are best for you. Knuckle bend or "claw" fist 1. Stand or sit with your arm, hand, and all five fingers pointed straight up. Make sure to keep your wrist straight during the exercise. 2. Gently bend your fingers down toward your palm until the tips of your fingers are touching the top of your palm. Keep your big knuckle straight and just bend the small knuckles in your fingers. 3. Hold this position for __________ seconds. 4. Straighten (extend) your fingers back to the starting position. Repeat this exercise 5-10 times with each hand. Full finger fist 1. Stand or sit with your arm, hand, and all five fingers pointed straight up. Make sure to keep your wrist straight during  the exercise. 2. Gently bend your fingers into your palm until the tips of  your fingers are touching the middle of your palm. 3. Hold this position for __________ seconds. 4. Extend your fingers back to the starting position, stretching every joint fully. Repeat this exercise 5-10 times with each hand. Straight fist 1. Stand or sit with your arm, hand, and all five fingers pointed straight up. Make sure to keep your wrist straight during the exercise. 2. Gently bend your fingers at the big knuckle, where your fingers meet your hand, and the middle knuckle. Keep the knuckle at the tips of your fingers straight and try to touch the bottom of your palm. 3. Hold this position for __________ seconds. 4. Extend your fingers back to the starting position, stretching every joint fully. Repeat this exercise 5-10 times with each hand. Tabletop 1. Stand or sit with your arm, hand, and all five fingers pointed straight up. Make sure to keep your wrist straight during the exercise. 2. Gently bend your fingers at the big knuckle, where your fingers meet your hand, as far down as you can while keeping the small knuckles in your fingers straight. Think of forming a tabletop with your fingers. 3. Hold this position for __________ seconds. 4. Extend your fingers back to the starting position, stretching every joint fully. Repeat this exercise 5-10 times with each hand. Finger spread 1. Place your hand flat on a table with your palm facing down. Make sure your wrist stays straight as you do this exercise. 2. Spread your fingers and thumb apart from each other as far as you can until you feel a gentle stretch. Hold this position for __________ seconds. 3. Bring your fingers and thumb tight together again. Hold this position for __________ seconds. Repeat this exercise 5-10 times with each hand. Making circles 1. Stand or sit with your arm, hand, and all five fingers pointed straight up. Make sure to keep your wrist straight during the exercise. 2. Make a circle by touching the tip of  your thumb to the tip of your index finger. 3. Hold for __________ seconds. Then open your hand wide. 4. Repeat this motion with your thumb and each finger on your hand. Repeat this exercise 5-10 times with each hand. Thumb motion 1. Sit with your forearm resting on a table and your wrist straight. Your thumb should be facing up toward the ceiling. Keep your fingers relaxed as you move your thumb. 2. Lift your thumb up as high as you can toward the ceiling. Hold for __________ seconds. 3. Bend your thumb across your palm as far as you can, reaching the tip of your thumb for the small finger (pinkie) side of your palm. Hold for __________ seconds. Repeat this exercise 5-10 times with each hand. Grip strengthening  1. Hold a stress ball or other soft ball in the middle of your hand. 2. Slowly increase the pressure, squeezing the ball as much as you can without causing pain. Think of bringing the tips of your fingers into the middle of your palm. All of your finger joints should bend when doing this exercise. 3. Hold your squeeze for __________ seconds, then relax. Repeat this exercise 5-10 times with each hand. Contact a health care provider if:  Your hand pain or discomfort gets much worse when you do an exercise.  Your hand pain or discomfort does not improve within 2 hours after you exercise. If you have any of these problems, stop doing these  exercises right away. Do not do them again unless your health care provider says that you can. Get help right away if:  You develop sudden, severe hand pain or swelling. If this happens, stop doing these exercises right away. Do not do them again unless your health care provider says that you can. This information is not intended to replace advice given to you by your health care provider. Make sure you discuss any questions you have with your health care provider. Document Revised: 12/30/2018 Document Reviewed: 09/09/2018 Elsevier Patient Education   [employer].    Core Strength Exercises  Core exercises help to build strength in the muscles between your ribs and your hips (abdominal muscles). These muscles help to support your body and keep your spine stable. It is important to maintain strength in your core to prevent injury and pain. Some activities, such as yoga and Pilates, can help to strengthen core muscles. You can also strengthen core muscles with exercises at home. It is important to talk to your health care provider before you start a new exercise routine. What are the benefits of core strength exercises? Core strength exercises can:  Reduce back pain.  Help to rebuild strength after a back or spine injury.  Help to prevent injury during physical activity, especially injuries to the back and knees. How to do core strength exercises Repeat these exercises 10-15 times, or until you are tired. Do exercises exactly as told by your health care provider and adjust them as directed. It is normal to feel mild stretching, pulling, tightness, or discomfort as you do these exercises. If you feel any pain while doing these exercises, stop. If your pain continues or gets worse when doing core exercises, contact your health care provider. You may want to use a padded yoga or exercise mat for strength exercises that are done on the floor. Bridging  1. Lie on your back on a firm surface with your knees bent and your feet flat on the floor. 2. Raise your hips so that your knees, hips, and shoulders form a straight line together. Keep your abdominal muscles tight. 3. Hold this position for 3-5 seconds. 4. Slowly lower your hips to the starting position. 5. Let your muscles relax completely between repetitions. Single-leg bridge 1. Lie on your back on a firm surface with your knees bent and your feet flat on the floor. 2. Raise your hips so that your knees, hips, and shoulders form a straight line together. Keep your abdominal  muscles tight. 3. Lift one foot off the floor, then completely straighten that leg. 4. Hold this position for 3-5 seconds. 5. Put the straight leg back down in the bent position. 6. Slowly lower your hips to the starting position. 7. Repeat these steps using your other leg. Side bridge 1. Lie on your side with your knees bent. Prop yourself up on the elbow that is near the floor. 2. Using your abdominal muscles and your elbow that is on the floor, raise your body off the floor. Raise your hip so that your shoulder, hip, and foot form a straight line together. 3. Hold this position for 10 seconds. Keep your head and neck raised and away from your shoulder (in their normal, neutral position). Keep your abdominal muscles tight. 4. Slowly lower your hip to the starting position. 5. Repeat and try to hold this position longer, working your way up to 30 seconds. Abdominal crunch 1. Lie on your back on a firm surface.  Bend your knees and keep your feet flat on the floor. 2. Cross your arms over your chest. 3. Without bending your neck, tip your chin slightly toward your chest. 4. Tighten your abdominal muscles as you lift your chest just high enough to lift your shoulder blades off of the floor. Do not hold your breath. You can do this with short lifts or long lifts. 5. Slowly return to the starting position. Bird dog 1. Get on your hands and knees, with your legs shoulder-width apart and your arms under your shoulders. Keep your back straight. 2. Tighten your abdominal muscles. 3. Raise one of your legs off the floor and straighten it. Try to keep it parallel to the floor. 4. Slowly lower your leg to the starting position. 5. Raise one of your arms off the floor and straighten it. Try to keep it parallel to the floor. 6. Slowly lower your arm to the starting position. 7. Repeat with the other arm and leg. If possible, try raising a leg and arm at the same time, on opposite sides of the body. For  example, raise your left hand and your right leg. Plank 1. Lie on your belly. 2. Prop up your body onto your forearms and your feet, keeping your legs straight. Your body should make a straight line between your shoulders and feet. 3. Hold this position for 10 seconds while keeping your abdominal muscles tight. 4. Lower your body to the starting position. 5. Repeat and try to hold this position longer, working your way up to 30 seconds. Cross-core strengthening 1. Stand with your feet shoulder-width apart. 2. Hold a ball out in front of you. Keep your arms straight. 3. Tighten your abdominal muscles and slowly rotate at your waist from side to side. Keep your feet flat. 4. Once you are comfortable, try repeating this exercise with a heavier ball. Top core strengthening 1. Stand about 18 inches (46 cm) in front of a wall, with your back to the wall. 2. Keep your feet flat and shoulder-width apart. 3. Tighten your abdominal muscles. 4. Bend your hips and knees. 5. Slowly reach between your legs to touch the wall behind you. 6. Slowly stand back up. 7. Raise your arms over your head and reach behind you. 8. Return to the starting position. General tips  Do not do any exercises that cause pain. If you have pain while exercising, talk to your health care provider.  Always stretch before and after doing these exercises. This can help prevent injury.  Maintain a healthy weight. Ask your health care provider what weight is healthy for you. Contact a health care provider if:  You have back pain that gets worse or does not go away.  You feel pain while doing core strength exercises. Get help right away if:  You have severe pain that does not get better with medicine. Summary  Core exercises help to build strength in the muscles between your ribs and your waist.  Core muscles help to support your body and keep your spine stable.  Some activities, such as yoga and Pilates, can help to  strengthen core muscles.  Core strength exercises can help back pain and can prevent injury.  If you feel any pain while doing core strength exercises, stop. This information is not intended to replace advice given to you by your health care provider. Make sure you discuss any questions you have with your health care provider. Document Revised: 12/29/2018 Document Reviewed: 01/28/2017 Elsevier Patient  Education  [employer].  Back Exercises These exercises help to make your trunk and back strong. They also help to keep the lower back flexible. Doing these exercises can help to prevent back pain or lessen existing pain.  If you have back pain, try to do these exercises 2-3 times each day or as told by your doctor.  As you get better, do the exercises once each day. Repeat the exercises more often as told by your doctor.  To stop back pain from coming back, do the exercises once each day, or as told by your doctor. Exercises Single knee to chest Do these steps 3-5 times in a row for each leg: 1. Lie on your back on a firm bed or the floor with your legs stretched out. 2. Bring one knee to your chest. 3. Grab your knee or thigh with both hands and hold them it in place. 4. Pull on your knee until you feel a gentle stretch in your lower back or buttocks. 5. Keep doing the stretch for 10-30 seconds. 6. Slowly let go of your leg and straighten it. Pelvic tilt Do these steps 5-10 times in a row: 1. Lie on your back on a firm bed or the floor with your legs stretched out. 2. Bend your knees so they point up to the ceiling. Your feet should be flat on the floor. 3. Tighten your lower belly (abdomen) muscles to press your lower back against the floor. This will make your tailbone point up to the ceiling instead of pointing down to your feet or the floor. 4. Stay in this position for 5-10 seconds while you gently tighten your muscles and breathe evenly. Cat-cow Do these steps until your  lower back bends more easily: 1. Get on your hands and knees on a firm surface. Keep your hands under your shoulders, and keep your knees under your hips. You may put padding under your knees. 2. Let your head hang down toward your chest. Tighten (contract) the muscles in your belly. Point your tailbone toward the floor so your lower back becomes rounded like the back of a cat. 3. Stay in this position for 5 seconds. 4. Slowly lift your head. Let the muscles of your belly relax. Point your tailbone up toward the ceiling so your back forms a sagging arch like the back of a cow. 5. Stay in this position for 5 seconds.  Press-ups Do these steps 5-10 times in a row: 1. Lie on your belly (face-down) on the floor. 2. Place your hands near your head, about shoulder-width apart. 3. While you keep your back relaxed and keep your hips on the floor, slowly straighten your arms to raise the top half of your body and lift your shoulders. Do not use your back muscles. You may change where you place your hands in order to make yourself more comfortable. 4. Stay in this position for 5 seconds. 5. Slowly return to lying flat on the floor.  Bridges Do these steps 10 times in a row: 1. Lie on your back on a firm surface. 2. Bend your knees so they point up to the ceiling. Your feet should be flat on the floor. Your arms should be flat at your sides, next to your body. 3. Tighten your butt muscles and lift your butt off the floor until your waist is almost as high as your knees. If you do not feel the muscles working in your butt and the back of your  thighs, slide your feet 1-2 inches farther away from your butt. 4. Stay in this position for 3-5 seconds. 5. Slowly lower your butt to the floor, and let your butt muscles relax. If this exercise is too easy, try doing it with your arms crossed over your chest. Belly crunches Do these steps 5-10 times in a row: 1. Lie on your back on a firm bed or the floor with  your legs stretched out. 2. Bend your knees so they point up to the ceiling. Your feet should be flat on the floor. 3. Cross your arms over your chest. 4. Tip your chin a little bit toward your chest but do not bend your neck. 5. Tighten your belly muscles and slowly raise your chest just enough to lift your shoulder blades a tiny bit off of the floor. Avoid raising your body higher than that, because it can put too much stress on your low back. 6. Slowly lower your chest and your head to the floor. Back lifts Do these steps 5-10 times in a row: 1. Lie on your belly (face-down) with your arms at your sides, and rest your forehead on the floor. 2. Tighten the muscles in your legs and your butt. 3. Slowly lift your chest off of the floor while you keep your hips on the floor. Keep the back of your head in line with the curve in your back. Look at the floor while you do this. 4. Stay in this position for 3-5 seconds. 5. Slowly lower your chest and your face to the floor. Contact a doctor if:  Your back pain gets a lot worse when you do an exercise.  Your back pain does not get better 2 hours after you exercise. If you have any of these problems, stop doing the exercises. Do not do them again unless your doctor says it is okay. Get help right away if:  You have sudden, very bad back pain. If this happens, stop doing the exercises. Do not do them again unless your doctor says it is okay. This information is not intended to replace advice given to you by your health care provider. Make sure you discuss any questions you have with your health care provider. Document Revised: 06/03/2018 Document Reviewed: 06/03/2018 Elsevier Patient Education  2020 [employer].

## 2020-05-18 IMAGING — MG DIGITAL DIAGNOSTIC UNILATERAL LEFT MAMMOGRAM WITH TOMO AND CAD
4 series · 4 of 12 positions shown · non-contrast
Comparison: Previous exam(s).

CLINICAL DATA: Patient was called back from screening mammogram for
a possible asymmetry in the left breast.

EXAM:
DIGITAL DIAGNOSTIC LEFT MAMMOGRAM WITH TOMO

[L MLO synth-2D]
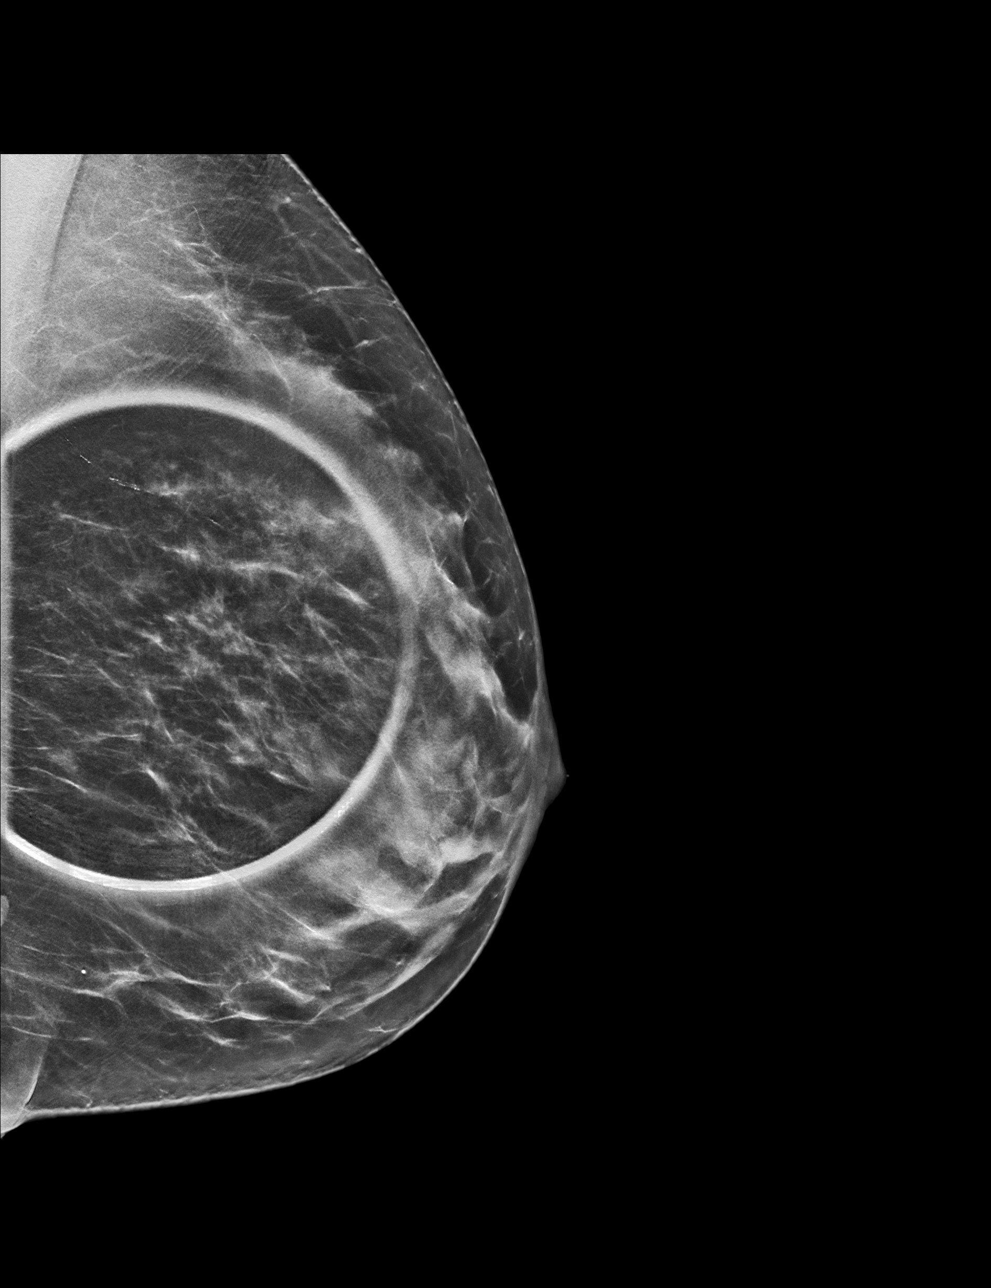

[L CC synth-2D]
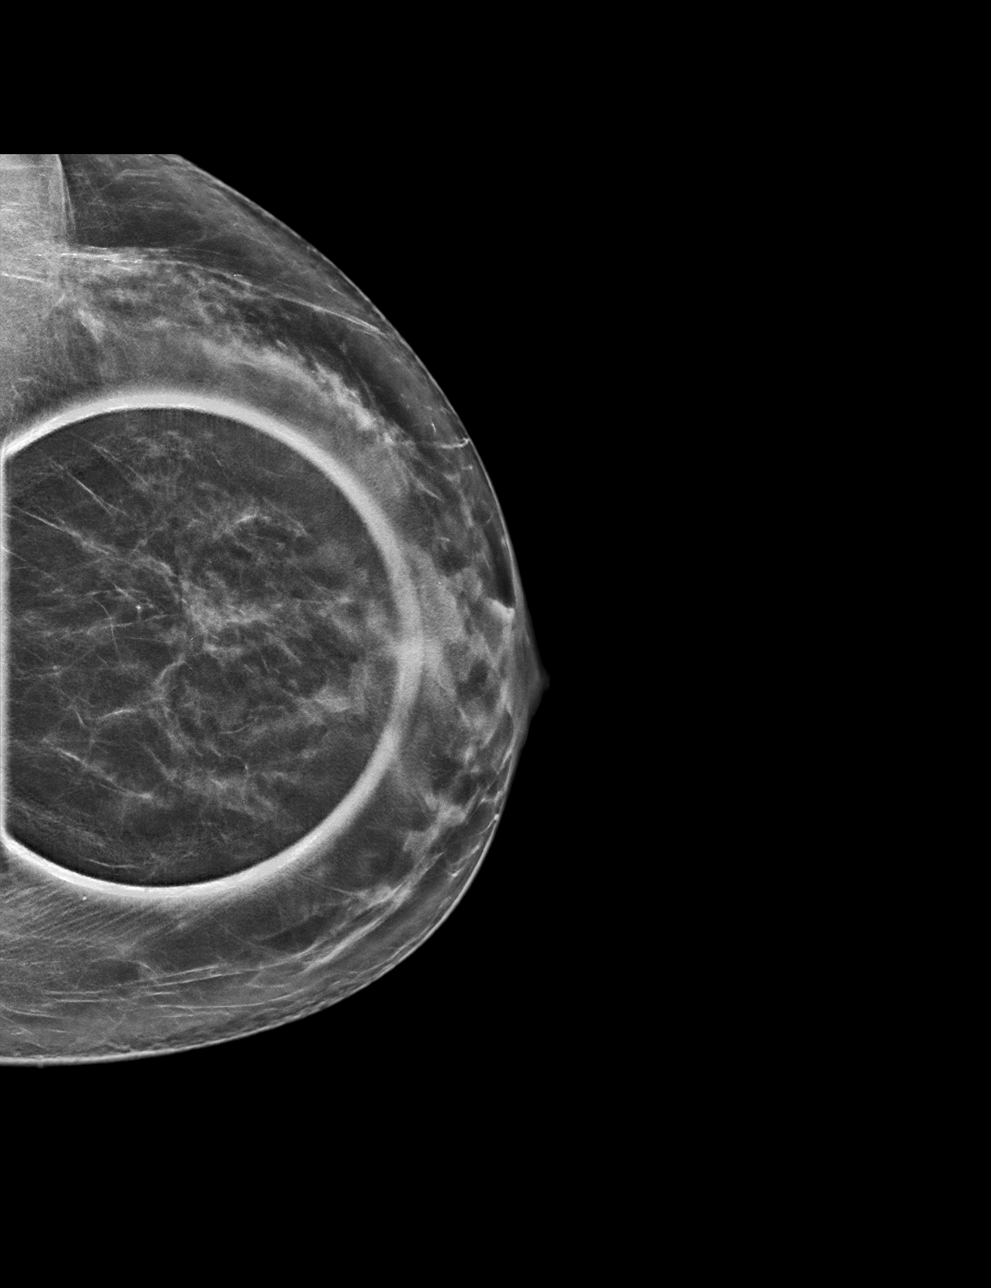

[L CC tomo · tomo slice 33/65.0]
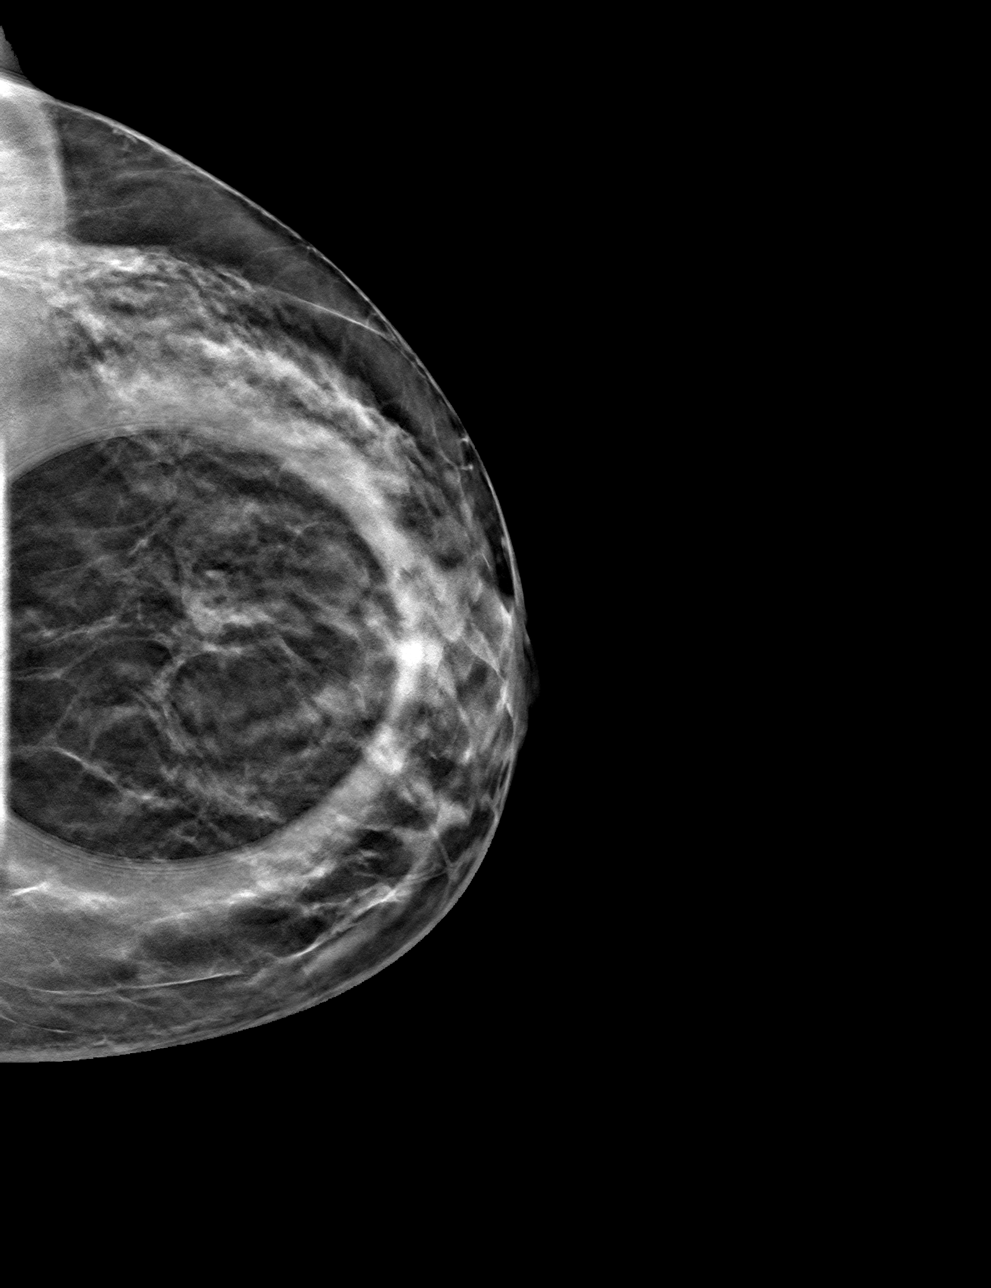

[L MLO tomo · tomo slice 31/60.0]
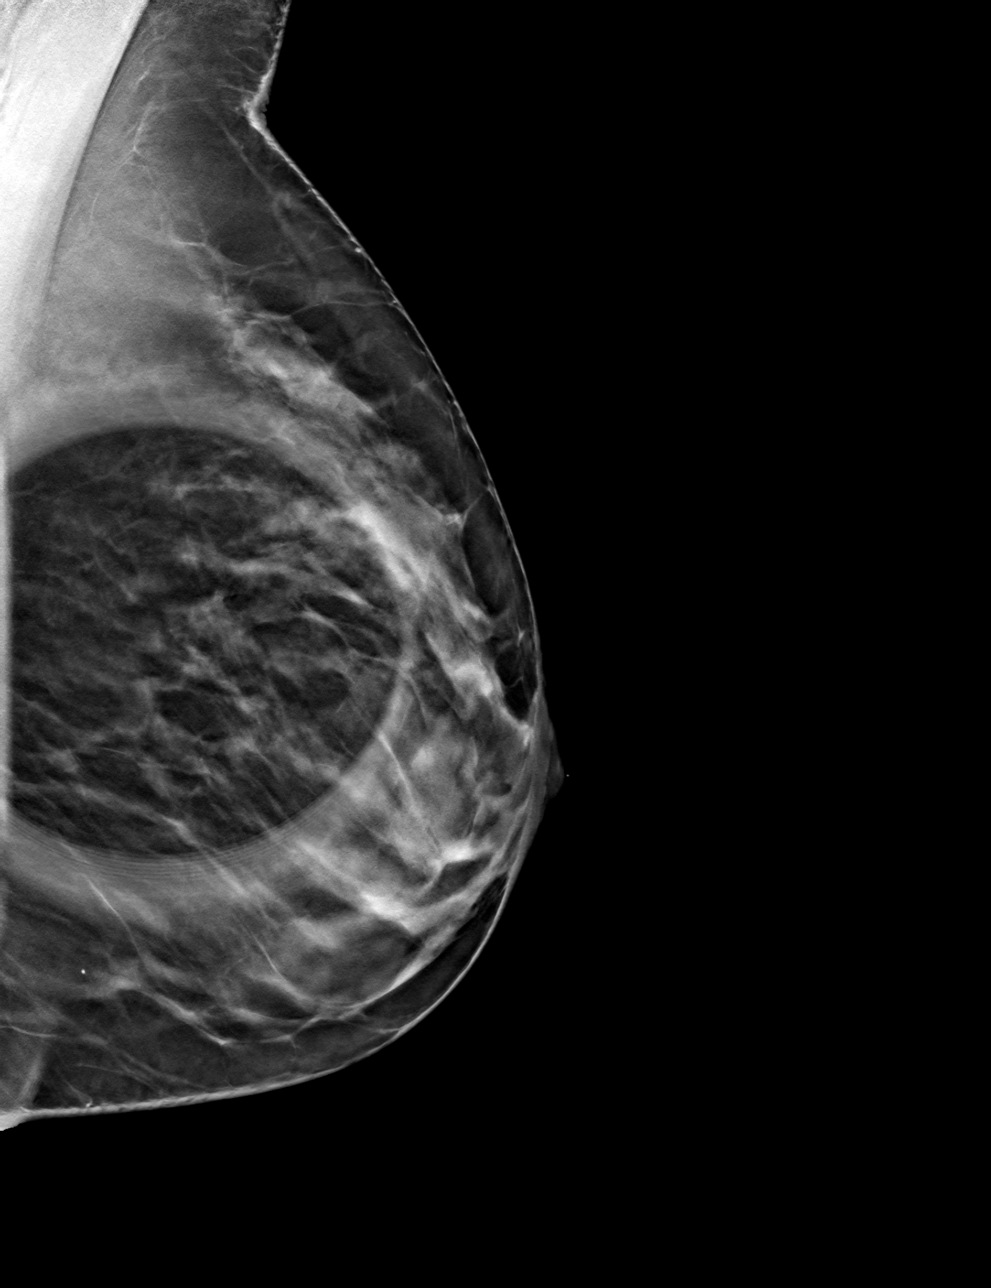

[4 of 12 positions shown; findings below may reference images not displayed]

ACR Breast Density Category c: The breast tissue is heterogeneously
dense, which may obscure small masses.
FINDINGS: Additional imaging of the left breast was performed. No persistent
mass, distortion or malignant type microcalcifications identified.
IMPRESSION: No evidence of malignancy in the left breast.

RECOMMENDATION:
Bilateral screening mammogram in 1 year is recommended.

I have discussed the findings and recommendations with the patient.
Results were also provided in writing at the conclusion of the
visit. If applicable, a reminder letter will be sent to the patient
regarding the next appointment.

BI-RADS CATEGORY  1: Negative.

## 2020-05-21 ENCOUNTER — Telehealth: Payer: Self-pay | Admitting: Internal Medicine

## 2020-05-21 NOTE — Progress Notes (Signed)
Spoke with patient, She moved to [facility] today 05/21/2020.   Carley Perdue [employer]

## 2020-05-30 ENCOUNTER — Inpatient Hospital Stay: Payer: Medicare Other

## 2020-08-29 ENCOUNTER — Inpatient Hospital Stay: Payer: Medicare Other

## 2020-11-02 ENCOUNTER — Encounter: Payer: Self-pay | Admitting: Gastroenterology
